# Patient Record
Sex: Female | Born: 1950 | Race: White | Hispanic: No | Smoking: Never smoker
Health system: Southern US, Community
[De-identification: ages and names within clinical notes are randomized; demographics above are authoritative.]

## PROBLEM LIST (undated history)

## (undated) DIAGNOSIS — K769 Liver disease, unspecified: Secondary | ICD-10-CM

## (undated) DIAGNOSIS — I4891 Unspecified atrial fibrillation: Secondary | ICD-10-CM

## (undated) DIAGNOSIS — T7840XA Allergy, unspecified, initial encounter: Secondary | ICD-10-CM

## (undated) DIAGNOSIS — Z803 Family history of malignant neoplasm of breast: Secondary | ICD-10-CM

## (undated) DIAGNOSIS — G4730 Sleep apnea, unspecified: Secondary | ICD-10-CM

## (undated) DIAGNOSIS — M1990 Unspecified osteoarthritis, unspecified site: Secondary | ICD-10-CM

## (undated) DIAGNOSIS — I10 Essential (primary) hypertension: Secondary | ICD-10-CM

## (undated) DIAGNOSIS — J45909 Unspecified asthma, uncomplicated: Secondary | ICD-10-CM

## (undated) DIAGNOSIS — K219 Gastro-esophageal reflux disease without esophagitis: Secondary | ICD-10-CM

## (undated) DIAGNOSIS — D735 Infarction of spleen: Secondary | ICD-10-CM

## (undated) DIAGNOSIS — R112 Nausea with vomiting, unspecified: Secondary | ICD-10-CM

## (undated) DIAGNOSIS — Z9889 Other specified postprocedural states: Secondary | ICD-10-CM

## (undated) DIAGNOSIS — K766 Portal hypertension: Secondary | ICD-10-CM

## (undated) DIAGNOSIS — Q8901 Asplenia (congenital): Secondary | ICD-10-CM

## (undated) DIAGNOSIS — J189 Pneumonia, unspecified organism: Secondary | ICD-10-CM

## (undated) HISTORY — PX: SPLENECTOMY, TOTAL: SHX788

## (undated) HISTORY — DX: Liver disease, unspecified: K76.9

## (undated) HISTORY — PX: HERNIA REPAIR: SHX51

## (undated) HISTORY — PX: ABDOMINAL HYSTERECTOMY: SHX81

## (undated) HISTORY — DX: Family history of malignant neoplasm of breast: Z80.3

## (undated) HISTORY — PX: TUBAL LIGATION: SHX77

## (undated) HISTORY — DX: Portal hypertension: K76.6

## (undated) HISTORY — DX: Asplenia (congenital): Q89.01

## (undated) HISTORY — DX: Gastro-esophageal reflux disease without esophagitis: K21.9

## (undated) HISTORY — PX: BREAST BIOPSY: SHX20

## (undated) HISTORY — DX: Allergy, unspecified, initial encounter: T78.40XA

## (undated) HISTORY — PX: FOOT SURGERY: SHX648

---

## 2015-04-09 DIAGNOSIS — R7989 Other specified abnormal findings of blood chemistry: Secondary | ICD-10-CM | POA: Insufficient documentation

## 2018-04-11 LAB — HM COLONOSCOPY

## 2018-09-16 DIAGNOSIS — Z23 Encounter for immunization: Secondary | ICD-10-CM | POA: Diagnosis not present

## 2018-11-03 DIAGNOSIS — D472 Monoclonal gammopathy: Secondary | ICD-10-CM | POA: Diagnosis not present

## 2018-11-06 DIAGNOSIS — R1011 Right upper quadrant pain: Secondary | ICD-10-CM | POA: Diagnosis not present

## 2018-11-06 DIAGNOSIS — K766 Portal hypertension: Secondary | ICD-10-CM | POA: Diagnosis not present

## 2018-11-06 DIAGNOSIS — D472 Monoclonal gammopathy: Secondary | ICD-10-CM | POA: Diagnosis not present

## 2018-11-06 DIAGNOSIS — Z9081 Acquired absence of spleen: Secondary | ICD-10-CM | POA: Diagnosis not present

## 2018-11-06 DIAGNOSIS — Z79899 Other long term (current) drug therapy: Secondary | ICD-10-CM | POA: Diagnosis not present

## 2018-11-06 DIAGNOSIS — Q8901 Asplenia (congenital): Secondary | ICD-10-CM | POA: Diagnosis not present

## 2018-11-06 DIAGNOSIS — D704 Cyclic neutropenia: Secondary | ICD-10-CM | POA: Diagnosis not present

## 2018-11-26 DIAGNOSIS — H9201 Otalgia, right ear: Secondary | ICD-10-CM | POA: Diagnosis not present

## 2018-11-26 DIAGNOSIS — J019 Acute sinusitis, unspecified: Secondary | ICD-10-CM | POA: Diagnosis not present

## 2018-11-26 DIAGNOSIS — B9689 Other specified bacterial agents as the cause of diseases classified elsewhere: Secondary | ICD-10-CM | POA: Diagnosis not present

## 2018-12-17 ENCOUNTER — Emergency Department (INDEPENDENT_AMBULATORY_CARE_PROVIDER_SITE_OTHER)
Admission: EM | Admit: 2018-12-17 | Discharge: 2018-12-17 | Disposition: A | Discharge status: Home / Self Care | Payer: Medicare Other | Source: Home / Self Care | Attending: Emergency Medicine | Admitting: Emergency Medicine

## 2018-12-17 ENCOUNTER — Encounter: Payer: Self-pay | Admitting: Emergency Medicine

## 2018-12-17 ENCOUNTER — Emergency Department (INDEPENDENT_AMBULATORY_CARE_PROVIDER_SITE_OTHER): Payer: Medicare Other

## 2018-12-17 DIAGNOSIS — S8265XA Nondisplaced fracture of lateral malleolus of left fibula, initial encounter for closed fracture: Secondary | ICD-10-CM

## 2018-12-17 DIAGNOSIS — W19XXXA Unspecified fall, initial encounter: Secondary | ICD-10-CM | POA: Diagnosis not present

## 2018-12-17 DIAGNOSIS — S8262XA Displaced fracture of lateral malleolus of left fibula, initial encounter for closed fracture: Secondary | ICD-10-CM | POA: Diagnosis not present

## 2018-12-17 DIAGNOSIS — S82832A Other fracture of upper and lower end of left fibula, initial encounter for closed fracture: Secondary | ICD-10-CM | POA: Diagnosis not present

## 2018-12-17 IMAGING — DX DG ANKLE COMPLETE 3+V*L*
3 series · 3 of 3 positions shown · non-contrast
Comparison: None.

CLINICAL DATA: 67-year-old who fell last night and injured the LEFT
ankle. Persistent pain and swelling. Initial encounter.

EXAM:
LEFT ANKLE COMPLETE - 3+ VIEW

[ankle ap]
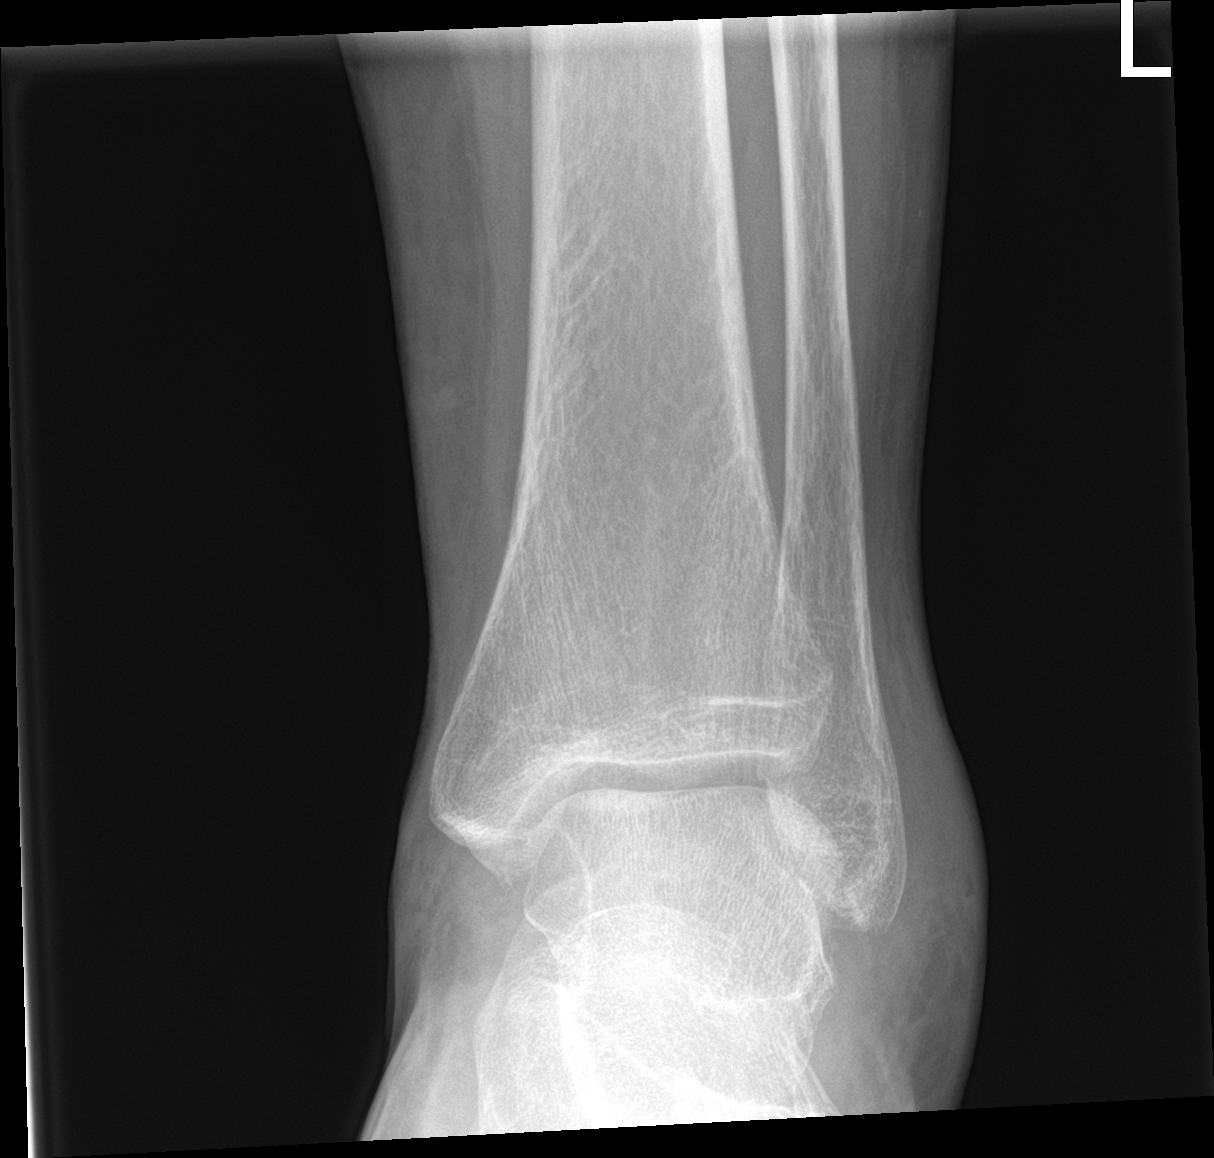

[ankle obl]
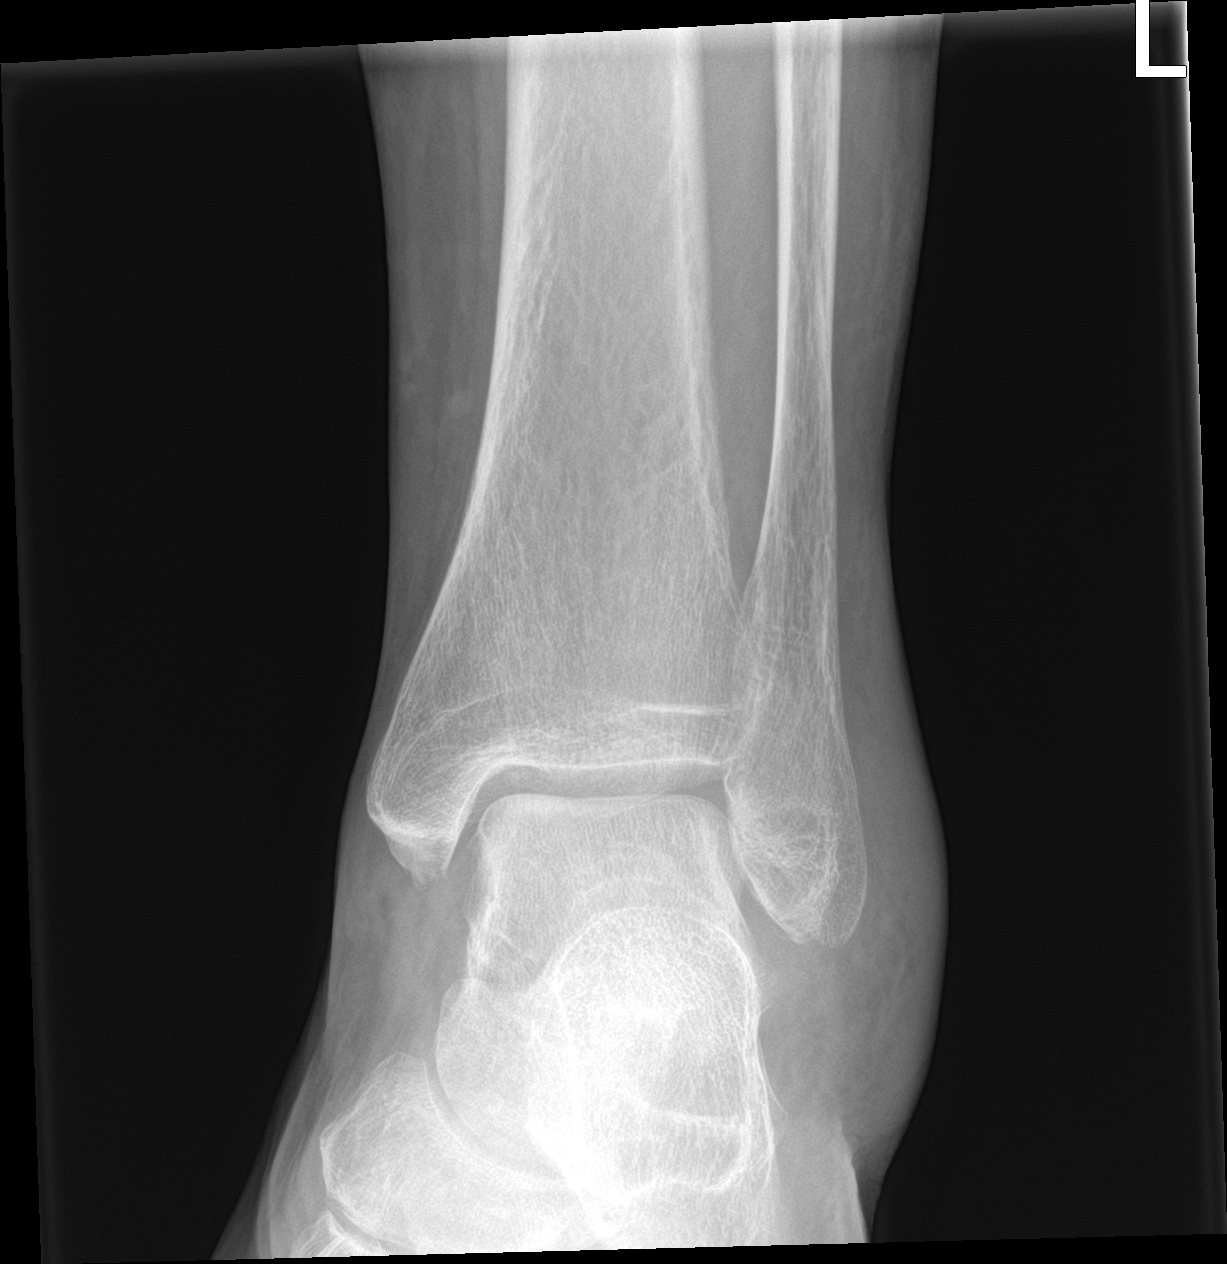

[ankle lat]
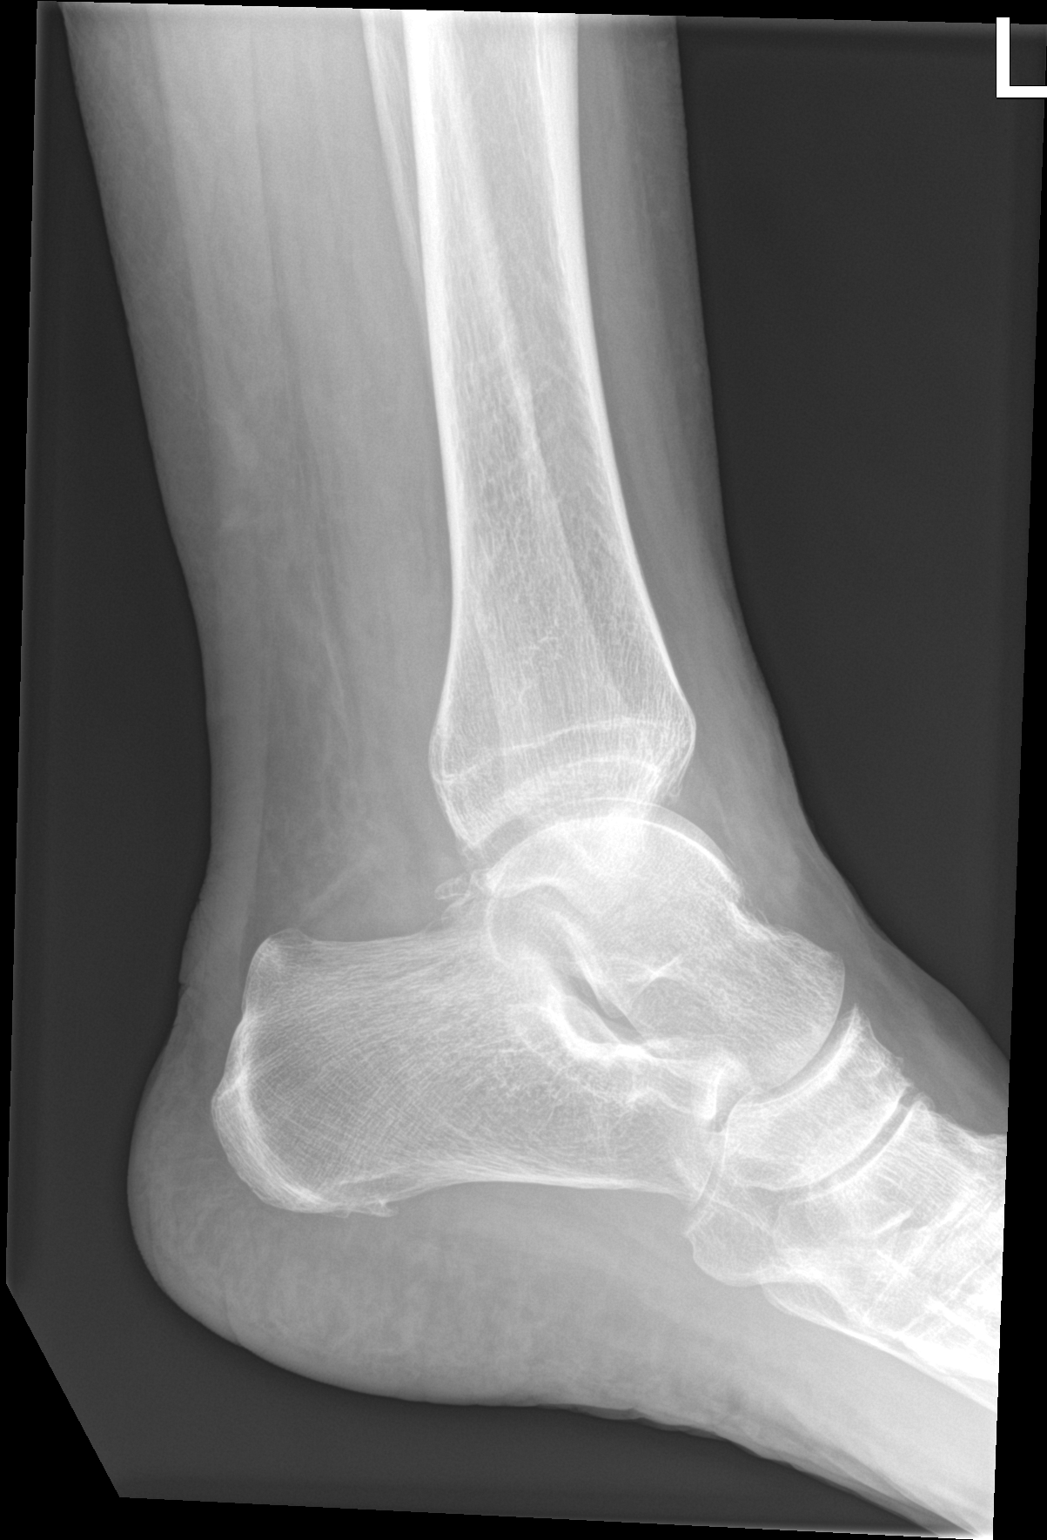

[3 of 3 positions shown; findings below may reference images not displayed]

FINDINGS: Diffuse soft tissue swelling, with marked LATERAL soft tissue
swelling. Very small avulsion fracture arising from the tip of the
LATERAL malleolus. No fractures elsewhere. Ankle mortise intact with
well-preserved joint space. Mild osseous demineralization. No
visible joint effusion.
IMPRESSION: Very small avulsion fracture arising from the tip of the LATERAL
malleolus.

## 2018-12-17 NOTE — ED Triage Notes (Signed)
Pt c/o left ankle pain after she fell last night. Swelling and states hx of previous fracture.

## 2018-12-17 NOTE — ED Provider Notes (Signed)
[name]    CSN: [id number] Arrival date & time: 12/17/18  1200     History   Chief Complaint Chief Complaint  Patient presents with  . Ankle Pain  Chief complaint: Acute left ankle injury  HPI Donna Ramsey is a 68 y.o. female.   HPI Yesterday, accidentally inverted/twisted left ankle and immediately had pain and swelling left lateral ankle.  Pain is moderate 5 out of 10, sharp and dull without radiation.  She is able to weight-bear minimally but painful to weight-bear or any movement of left ankle.  Denies left foot pain.  She took some ibuprofen OTC at 930 this morning which helped pain a little bit. Associated symptoms: Denies numbness or weakness.  No loss of consciousness or focal neurologic symptoms.  No chest pain shortness of breath nausea or vomiting.  She reports a history of left ankle fracture 10 years ago which healed without sequelae. History reviewed. No pertinent past medical history.  There are no active problems to display for this patient.   Past Surgical History:  Procedure Laterality Date  . ABDOMINAL HYSTERECTOMY    . FOOT SURGERY    . SPLENECTOMY, TOTAL      OB History   No obstetric history on file.      Home Medications    Prior to Admission medications   Not on File    Family History History reviewed. No pertinent family history. No family history noted by patient. Social History Social History   Tobacco Use  . Smoking status: Never Smoker  . Smokeless tobacco: Never Used  Substance Use Topics  . Alcohol use: Not on file  . Drug use: Not on file   Reviewed above.  Never smoked.  Allergies   Cephalosporins; Codeine; Penicillins; Levofloxacin; and Sulfa antibiotics   Review of Systems Review of Systems  All other systems reviewed and are negative.  Pertinent items noted in HPI and remainder of comprehensive ROS otherwise negative.   Physical Exam Triage Vital Signs ED Triage Vitals [12/17/18 1224]    Enc Vitals Group     BP (!) 145/81     Pulse Rate 64     Resp      Temp 98 F (36.7 C)     Temp Source Oral     SpO2 96 %     Weight 180 lb (81.6 kg)     Height      Head Circumference      Peak Flow      Pain Score 0     Pain Loc      Pain Edu?      Excl. in [place]?    No data found.  Updated Vital Signs BP (!) 145/81 (BP Location: Right Arm)   Pulse 64   Temp 98 F (36.7 C) (Oral)   Wt 81.6 kg   SpO2 96%    Physical Exam Vitals signs reviewed.  Constitutional:      General: She is not in acute distress.    Appearance: She is well-developed.  HENT:     Head: Normocephalic and atraumatic.  Eyes:     General: No scleral icterus.    Pupils: Pupils are equal, round, and reactive to light.  Neck:     Musculoskeletal: Normal range of motion and neck supple.  Cardiovascular:     Rate and Rhythm: Normal rate and regular rhythm.  Pulmonary:     Effort: Pulmonary effort is normal.  Abdominal:     General:  There is no distension.  Musculoskeletal:     Left knee: She exhibits normal range of motion and no bony tenderness.     Left ankle: She exhibits decreased range of motion, swelling and ecchymosis. She exhibits no laceration and normal pulse. Tenderness. Lateral malleolus, AITFL and proximal fibula tenderness found. No head of 5th metatarsal tenderness found. Achilles tendon normal.     Left foot: Normal capillary refill. No tenderness, bony tenderness, swelling or crepitus.     Comments: No calf swelling or tenderness or cords bilaterally.  Left knee: 1 x 1 cm area of tenderness with mild ecchymosis proximal lateral tibia.  No other tenderness or abnormality of left knee noted.  No instability.-Discussed with patient that it is extremely unlikely no evidence of fracture left knee based on physical exam, as she can ambulate and move left knee without any knee pain or problems.  Skin:    General: Skin is warm and dry.     Capillary Refill: Capillary refill takes less than  2 seconds.  Neurological:     General: No focal deficit present.     Mental Status: She is alert and oriented to person, place, and time.     Cranial Nerves: No cranial nerve deficit.  Psychiatric:        Behavior: Behavior normal.      UC Treatments / Results  Labs (all labs ordered are listed, but only abnormal results are displayed) Labs Reviewed - No data to display  EKG None  Radiology- both patient and I agree that imaging of left knee not indicated because physical exam does not suggest need for this.  X-RAY LEFT ANKLE:  LEFT ANKLE COMPLETE - 3+ VIEW  COMPARISON: None.  FINDINGS: Diffuse soft tissue swelling, with marked LATERAL soft tissue swelling. Very small avulsion fracture arising from the tip of the LATERAL malleolus. No fractures elsewhere. Ankle mortise intact with well-preserved joint space. Mild osseous demineralization. No visible joint effusion.  IMPRESSION: Very small avulsion fracture arising from the tip of the LATERAL malleolus.   Electronically Signed By: Evangeline Dakin M.D. On: 12/17/2018 13:06  Procedures Procedures (including critical care time)  Medications Ordered in UC Medications - No data to display  Initial Impression / Assessment and Plan / UC Course  I have reviewed the triage vital signs and the nursing notes.  Pertinent labs & imaging results that were available during my care of the patient were reviewed by me and considered in my medical decision making (see chart for details).     Reviewed with patient, x-ray shows very small avulsion fracture, tip of the lateral malleolus of left ankle.   Final Clinical Impressions(s) / UC Diagnoses   Final diagnoses:  Closed nondisplaced fracture of lateral malleolus of left fibula, initial encounter   Discussed with patient at length and reviewed x-ray showing very small avulsion fracture tip of lateral malleolus of left ankle. Treatment options discussed, as well as  risks, benefits, alternatives. Patient voiced understanding and agreement with the following plans:  Ace bandage applied left ankle. Ice, elevate.  Rest left ankle. Cam walker/boot left lower extremity applied, and that immobilized ankle well and decreased her pain. Crutches provided. She declined any prescription pain med, and prefers to use OTC ibuprofen up to 600 mg 3 times daily with food as needed for pain.  She has no history of kidney disease or intolerance to ibuprofen, but precautions discussed. Follow-up with sports medicine or orthopedist within 3-5 days, sooner if worse or new  symptoms. Red flags discussed and we discussed what to do if any red flags. She voiced understanding and agreement.   Controlled Substance Prescriptions [place] Controlled Substance Registry consulted? Not Applicable   Jacqulyn Cane, MD 12/19/18 1420

## 2018-12-24 ENCOUNTER — Ambulatory Visit (INDEPENDENT_AMBULATORY_CARE_PROVIDER_SITE_OTHER): Payer: Medicare Other

## 2018-12-24 ENCOUNTER — Encounter: Payer: Self-pay | Admitting: Sports Medicine

## 2018-12-24 ENCOUNTER — Ambulatory Visit (INDEPENDENT_AMBULATORY_CARE_PROVIDER_SITE_OTHER): Payer: Medicare Other | Admitting: Sports Medicine

## 2018-12-24 DIAGNOSIS — W19XXXD Unspecified fall, subsequent encounter: Secondary | ICD-10-CM

## 2018-12-24 DIAGNOSIS — S8265XA Nondisplaced fracture of lateral malleolus of left fibula, initial encounter for closed fracture: Secondary | ICD-10-CM | POA: Diagnosis not present

## 2018-12-24 DIAGNOSIS — S8262XD Displaced fracture of lateral malleolus of left fibula, subsequent encounter for closed fracture with routine healing: Secondary | ICD-10-CM

## 2018-12-24 DIAGNOSIS — S82402A Unspecified fracture of shaft of left fibula, initial encounter for closed fracture: Secondary | ICD-10-CM | POA: Insufficient documentation

## 2018-12-24 DIAGNOSIS — S82832A Other fracture of upper and lower end of left fibula, initial encounter for closed fracture: Secondary | ICD-10-CM | POA: Diagnosis not present

## 2018-12-24 IMAGING — DX DG ANKLE COMPLETE 3+V*L*
3 series · 3 of 3 positions shown · non-contrast
Comparison: Radiographs dated [DATE]

CLINICAL DATA: Follow-up of avulsion fracture of the distal fibula.

EXAM:
LEFT ANKLE COMPLETE - 3+ VIEW

[ankle ap]
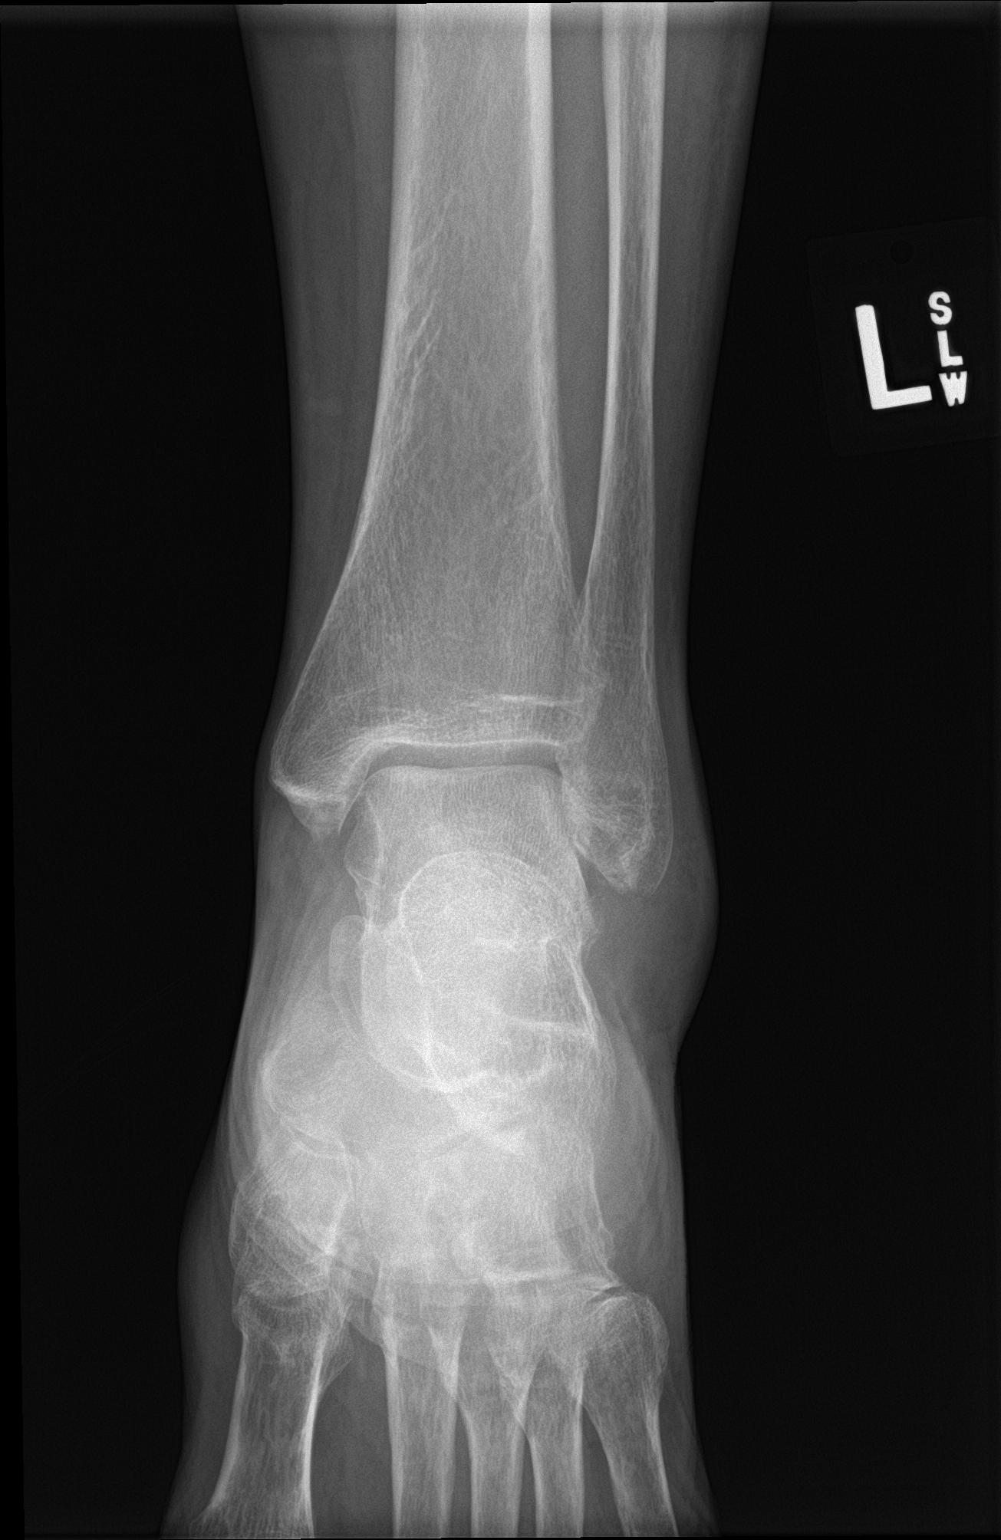

[ankle obl]
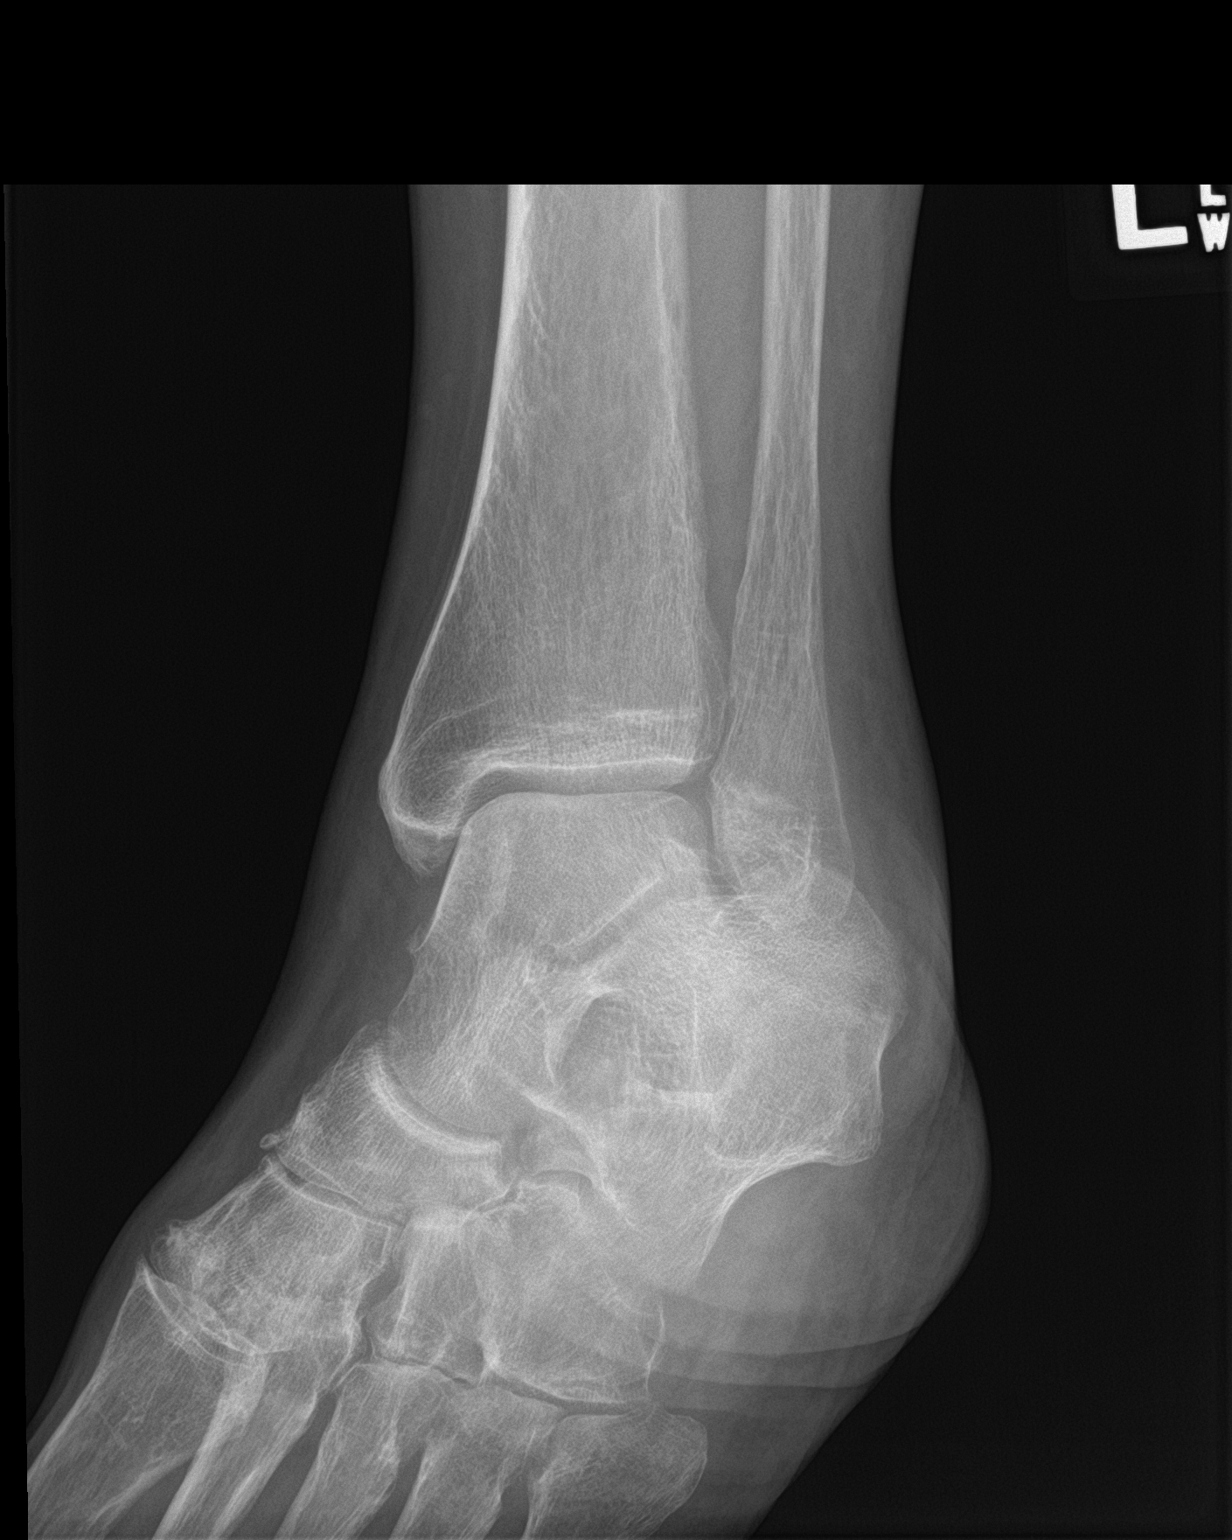

[ankle lat]
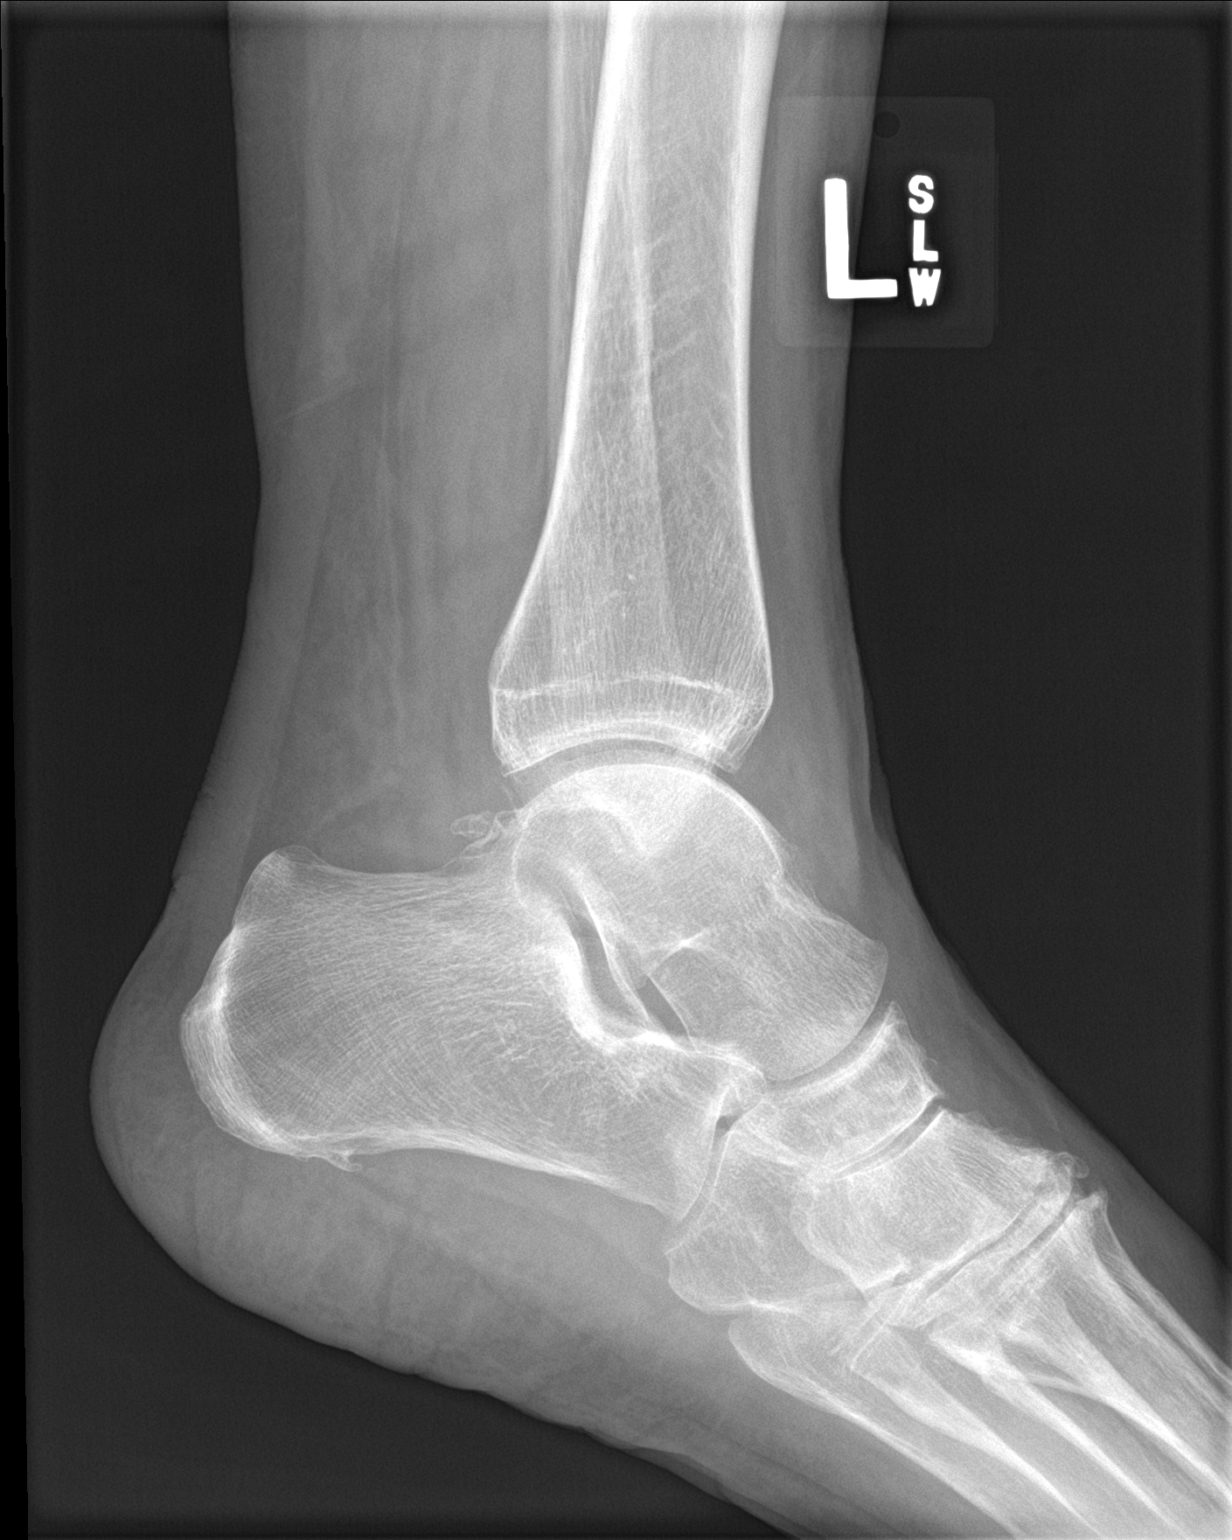

[3 of 3 positions shown; findings below may reference images not displayed]

FINDINGS: Again noted is a tiny avulsion from the tip of the lateral
malleolus. There is soft tissue swelling adjacent to the distal
fibula, but this has diminished.

Degenerative changes are present in the midfoot, stable.
IMPRESSION: Small avulsion from the tip of the lateral malleolus, unchanged.
Diminished soft tissue swelling.

## 2018-12-24 MED ORDER — MELOXICAM 15 MG PO TABS: ORAL_TABLET | ORAL | 3 refills | Status: DC

## 2018-12-24 NOTE — Progress Notes (Addendum)
Subjective:    CC: Left ankle injury  HPI:  Recently this pleasant 68 year old female took a misstep, inverted her left ankle and had immediate pain, swelling, bruising.  She was seen in urgent care where x-rays showed a fracture at the tip of the lateral malleolus.  Pain is mild, persistent.  Localized without radiation, has not been consistently wearing her cam boot.  She does have history of hepatic insufficiency, so avoiding acetaminophen.  I reviewed the past medical history, family history, social history, surgical history, and allergies today and no changes were needed.  Please see the problem list section below in epic for further details.  Past Medical History: No past medical history on file. Past Surgical History: Past Surgical History:  Procedure Laterality Date  . ABDOMINAL HYSTERECTOMY    . FOOT SURGERY    . SPLENECTOMY, TOTAL     Social History: Social History   Socioeconomic History  . Marital status: Married    Spouse name: Not on file  . Number of children: Not on file  . Years of education: Not on file  . Highest education level: Not on file  Occupational History  . Not on file  Social Needs  . Financial resource strain: Not on file  . Food insecurity:    Worry: Not on file    Inability: Not on file  . Transportation needs:    Medical: Not on file    Non-medical: Not on file  Tobacco Use  . Smoking status: Never Smoker  . Smokeless tobacco: Never Used  Substance and Sexual Activity  . Alcohol use: Not on file  . Drug use: Not on file  . Sexual activity: Not on file  Lifestyle  . Physical activity:    Days per week: Not on file    Minutes per session: Not on file  . Stress: Not on file  Relationships  . Social connections:    Talks on phone: Not on file    Gets together: Not on file    Attends religious service: Not on file    Active member of club or organization: Not on file    Attends meetings of clubs or organizations: Not on file   Relationship status: Not on file  Other Topics Concern  . Not on file  Social History Narrative  . Not on file   Family History: No family history on file. Allergies: Allergies  Allergen Reactions  . Cephalosporins Hives  . Codeine Hives and Rash  . Penicillins Hives, Other (See Comments) and Rash    Hives   . Levofloxacin Rash and Other (See Comments)    tendonitis tendonitis unknown unknown   . Sulfa Antibiotics Rash   Medications: See med rec.  Review of Systems: No headache, visual changes, nausea, vomiting, diarrhea, constipation, dizziness, abdominal pain, skin rash, fevers, chills, night sweats, swollen lymph nodes, weight loss, chest pain, body aches, joint swelling, muscle aches, shortness of breath, mood changes, visual or auditory hallucinations.  Objective:    General: Well Developed, well nourished, and in no acute distress.  Neuro: Alert and oriented x3, extra-ocular muscles intact, sensation grossly intact.  HEENT: Normocephalic, atraumatic, pupils equal round reactive to light, neck supple, no masses, no lymphadenopathy, thyroid nonpalpable.  Skin: Warm and dry, no rashes noted.  Cardiac: Regular rate and rhythm, no murmurs rubs or gallops.  Respiratory: Clear to auscultation bilaterally. Not using accessory muscles, speaking in full sentences.  Abdominal: Soft, nontender, nondistended, positive bowel sounds, no masses, no organomegaly.  Left ankle: Moderate bruising, mild swelling. Range of motion is full in all directions. Strength is 5/5 in all directions. Stable lateral and medial ligaments; squeeze test and kleiger test unremarkable; Talar dome nontender; No pain at base of 5th MT; No tenderness over cuboid; No tenderness over N spot or navicular prominence Tender to palpation at the tip of the lateral malleolus No sign of peroneal tendon subluxations; Negative tarsal tunnel tinel's Able to walk 4 steps.  X-ray shows a tiny avulsion from the tip  of the lateral malleolus.  Also widespread degenerative changes.  Impression and Recommendations:    The patient was counselled, risk factors were discussed, anticipatory guidance given.  Fracture of fibula, left, closed Continue cam boot. Repeat x-rays today. Meloxicam for pain, patient understands the risk of delayed fracture healing but she does have history of hepatic insufficiency. Return to see me in a month.  I billed a fracture code for this encounter, all subsequent visits will be post-op checks in the global period.  ___________________________________________ Gwen Her. Dianah Field, M.D., ABFM., CAQSM. Primary Care and Sports Medicine [facility]  Adjunct Professor of [place] [employer] [facility] [employer]

## 2018-12-24 NOTE — Assessment & Plan Note (Signed)
Continue cam boot. Repeat x-rays today. Meloxicam for pain, patient understands the risk of delayed fracture healing but she does have history of hepatic insufficiency. Return to see me in a month.  I billed a fracture code for this encounter, all subsequent visits will be post-op checks in the global period.

## 2019-02-02 ENCOUNTER — Encounter: Payer: Self-pay | Admitting: Sports Medicine

## 2019-02-02 ENCOUNTER — Ambulatory Visit (INDEPENDENT_AMBULATORY_CARE_PROVIDER_SITE_OTHER): Payer: Medicare Other | Admitting: Sports Medicine

## 2019-02-02 DIAGNOSIS — S63092A Other subluxation of left wrist and hand, initial encounter: Secondary | ICD-10-CM

## 2019-02-02 DIAGNOSIS — S8265XD Nondisplaced fracture of lateral malleolus of left fibula, subsequent encounter for closed fracture with routine healing: Secondary | ICD-10-CM | POA: Diagnosis not present

## 2019-02-02 NOTE — Patient Instructions (Signed)
Extensor Carpi Ulnaris Instability Tendons attach muscles to bones. They also help with joint movements. When a tendon is injured or it moves out of its normal position, the joint movements can be affected. The extensor carpi ulnaris (ECU) tendon is located on the back of the wrist, on the side that is closest to the little finger. Itruns under a band of fibrous tissue (extensor retinaculum) to stabilize its position in the wrist. This tendonhelps with extending or bending back your wrist and angling your wrist toward your body. It also helps with gripping and pulling items close to your body. Injury to the ECU may result in ECU instability and hand and wrist weakness. Damage to the retinaculum causes the ECU tendon to slip in and out of the bony groove (subluxation) that it normally lies in or to come out of the groove completely (dislocation). What are the causes? This condition often happens with repeated activities that require forceful rotation or extension of the wrist. Other causes of ECU instability include:  Forced (traumatic) rotation of the wrist to a palm-up position.  A shallow or malformed groove in the bone where the ECU normally lies. What increases the risk? This condition is more likely to develop in:  People who play sports that include repeated, forceful turning up of the wrist into a palm-up position, such as tennis, golf, and weight lifting.  People who have had a previous wrist injury.  People who have poor wrist strength and flexibility.  People who do not warm up properly before activities. What are the signs or symptoms? Symptoms of this condition include:  Painful or painless "snapping" feeling over the back of the wrist-on the little finger side-when you rotate your wrist into the palm-up position.  Swelling of your injured wrist.  Pain when the injured area is touched.  Weakness in your wrist when you turn it to the palm-up position. How is this  diagnosed? This condition is diagnosed with a medical history and physical exam. You may also have imaging studies to confirm the diagnosis. These can include:  Ultrasound.  MRI. How is this treated? Treatment may include the use of icing and medicines to reduce pain and swelling.You may also be advised to wear a splint or brace to limit your wrist motion. In less severe cases, treatment may also include working with a physical therapist to strengthen your wrist and stabilize your ECU. In severe cases, surgery may be needed. Follow these instructions at home: If you have a splint or brace:  Wear it as told by your health care provider. Remove it only as told by your health care provider.  Loosen the splint or brace if your fingers become numb and tingle, or if they turn cold and blue.  Keep the splint or brace clean and dry. Managing pain, stiffness, and swelling  If directed, apply ice to the injured area. ? Put ice in a plastic bag. ? Place a towel between your skin and the bag. ? Leave the ice on for 20 minutes, 2-3 times per day.  Move your fingers often to avoid stiffness and to lessen swelling.  Raise (elevate) the injured area above the level of your heart while you are sitting or lying down. General instructions  Return to your normal activities as told by your health care provider. Ask your health care provider what activities are safe for you.  Take over-the-counter and prescription medicines only as told by your health care provider.  Keep all follow-up visits  as told by your health care provider. This is important.  Do not drive or operate heavy machinery while taking prescription pain medicine. Contact a health care provider if:  Your pain, tenderness, or swelling gets worse, even if you have had treatment.  You have numbness or tingling in your wrist, hand, or fingers on the injured side. This information is not intended to replace advice given to you by your  health care provider. Make sure you discuss any questions you have with your health care provider. Document Released: 12/03/2005 Document Revised: 05/10/2016 Document Reviewed: 02/04/2015 Elsevier Interactive Patient Education  2019 [employer].

## 2019-02-02 NOTE — Assessment & Plan Note (Signed)
Velcro wrist brace. Continue meloxicam. Exercises given, return to see me in 2 weeks for this.

## 2019-02-02 NOTE — Progress Notes (Signed)
Subjective:    CC: Follow-up  HPI: Donna Ramsey returns, she is a pleasant 68 year old female, we have been treating her for a lateral malleolar avulsion fracture, this is for the most part pain-free after about 6 weeks in a boot.  Unfortunately she went to visit some family, 1 of her dogs jumped on her, she pushed the dog back with her left hand but then immediately felt pain over the ulnar aspect of her left wrist.  No mechanical symptoms, but pain is worse with twisting.  Severe, persistent, localized over the ulnar styloid without radiation.  No swelling, no bruising.  I reviewed the past medical history, family history, social history, surgical history, and allergies today and no changes were needed.  Please see the problem list section below in epic for further details.  Past Medical History: No past medical history on file. Past Surgical History: Past Surgical History:  Procedure Laterality Date  . ABDOMINAL HYSTERECTOMY    . FOOT SURGERY    . SPLENECTOMY, TOTAL     Social History: Social History   Socioeconomic History  . Marital status: Married    Spouse name: Not on file  . Number of children: Not on file  . Years of education: Not on file  . Highest education level: Not on file  Occupational History  . Not on file  Social Needs  . Financial resource strain: Not on file  . Food insecurity:    Worry: Not on file    Inability: Not on file  . Transportation needs:    Medical: Not on file    Non-medical: Not on file  Tobacco Use  . Smoking status: Never Smoker  . Smokeless tobacco: Never Used  Substance and Sexual Activity  . Alcohol use: Not on file  . Drug use: Not on file  . Sexual activity: Not on file  Lifestyle  . Physical activity:    Days per week: Not on file    Minutes per session: Not on file  . Stress: Not on file  Relationships  . Social connections:    Talks on phone: Not on file    Gets together: Not on file    Attends religious service: Not on file     Active member of club or organization: Not on file    Attends meetings of clubs or organizations: Not on file    Relationship status: Not on file  Other Topics Concern  . Not on file  Social History Narrative  . Not on file   Family History: No family history on file. Allergies: Allergies  Allergen Reactions  . Cephalosporins Hives  . Codeine Hives and Rash  . Penicillins Hives, Other (See Comments) and Rash    Hives   . Levofloxacin Rash and Other (See Comments)    tendonitis tendonitis unknown unknown   . Sulfa Antibiotics Rash   Medications: See med rec.  Review of Systems: No fevers, chills, night sweats, weight loss, chest pain, or shortness of breath.   Objective:    General: Well Developed, well nourished, and in no acute distress.  Neuro: Alert and oriented x3, extra-ocular muscles intact, sensation grossly intact.  HEENT: Normocephalic, atraumatic, pupils equal round reactive to light, neck supple, no masses, no lymphadenopathy, thyroid nonpalpable.  Skin: Warm and dry, no rashes. Cardiac: Regular rate and rhythm, no murmurs rubs or gallops, no lower extremity edema.  Respiratory: Clear to auscultation bilaterally. Not using accessory muscles, speaking in full sentences. Left ankle: No visible erythema or  swelling. Range of motion is full in all directions. Strength is 5/5 in all directions. Stable lateral and medial ligaments; squeeze test and kleiger test unremarkable; Talar dome nontender; No pain at base of 5th MT; No tenderness over cuboid; No tenderness over N spot or navicular prominence No tenderness on posterior aspects of lateral and medial malleolus No sign of peroneal tendon subluxations; Negative tarsal tunnel tinel's Able to walk 4 steps. Left wrist: Inspection normal with no visible erythema or swelling. ROM smooth and normal with good flexion and extension and ulnar/radial deviation that is symmetrical with opposite wrist. Palpation is  normal over metacarpals, navicular, lunate, and TFCC. Tender to palpation over the sixth extensor compartment and the extensor carpi ulnaris specifically, reproduction of pain with resisted ulnar deviation of the wrist, no tenderness over the ulna or the ulnar styloid No snuffbox tenderness. No tenderness over [place] of [place]. Strength 5/5 in all directions without pain. Negative tinel's and phalens signs. Negative Finkelstein sign. Negative Watson's test.  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 Continue meloxicam. Exercises given, return to see me in 2 weeks for this.  Fracture of fibula, left, closed Pretty much healed, transition into a regular shoe.  ___________________________________________ Gwen Her. Dianah Field, M.D., ABFM., CAQSM. Primary Care and Sports Medicine [facility]  Adjunct Professor of [place] [employer]

## 2019-02-02 NOTE — Assessment & Plan Note (Signed)
Pretty much healed, transition into a regular shoe.

## 2019-02-03 ENCOUNTER — Ambulatory Visit: Payer: Medicare Other | Admitting: Sports Medicine

## 2019-02-16 ENCOUNTER — Ambulatory Visit (INDEPENDENT_AMBULATORY_CARE_PROVIDER_SITE_OTHER): Payer: Medicare Other | Admitting: Sports Medicine

## 2019-02-16 ENCOUNTER — Encounter: Payer: Self-pay | Admitting: Sports Medicine

## 2019-02-16 DIAGNOSIS — M7552 Bursitis of left shoulder: Secondary | ICD-10-CM | POA: Diagnosis not present

## 2019-02-16 DIAGNOSIS — M7672 Peroneal tendinitis, left leg: Secondary | ICD-10-CM | POA: Diagnosis not present

## 2019-02-16 DIAGNOSIS — S63092D Other subluxation of left wrist and hand, subsequent encounter: Secondary | ICD-10-CM

## 2019-02-16 NOTE — Assessment & Plan Note (Signed)
Pain resolved with Velcro brace.

## 2019-02-16 NOTE — Progress Notes (Signed)
Subjective:    CC: Shoulder pain  HPI: Donna Ramsey returns, she is a pleasant 68 year old female, we treated her for a left fibular fracture, this resolved, also for a ECU subluxation, this resolved.  She is now complaining of pain in her left shoulder, localized over the deltoid and worse with overhead activities in the doctor.  It is waking her from sleep.  Moderate, persistent, no paresthesias, no trauma.  In addition she has had different pain in her left ankle, localized behind the lateral malleolus.  I reviewed the past medical history, family history, social history, surgical history, and allergies today and no changes were needed.  Please see the problem list section below in epic for further details.  Past Medical History: No past medical history on file. Past Surgical History: Past Surgical History:  Procedure Laterality Date  . ABDOMINAL HYSTERECTOMY    . FOOT SURGERY    . SPLENECTOMY, TOTAL     Social History: Social History   Socioeconomic History  . Marital status: Married    Spouse name: Not on file  . Number of children: Not on file  . Years of education: Not on file  . Highest education level: Not on file  Occupational History  . Not on file  Social Needs  . Financial resource strain: Not on file  . Food insecurity:    Worry: Not on file    Inability: Not on file  . Transportation needs:    Medical: Not on file    Non-medical: Not on file  Tobacco Use  . Smoking status: Never Smoker  . Smokeless tobacco: Never Used  Substance and Sexual Activity  . Alcohol use: Not on file  . Drug use: Not on file  . Sexual activity: Not on file  Lifestyle  . Physical activity:    Days per week: Not on file    Minutes per session: Not on file  . Stress: Not on file  Relationships  . Social connections:    Talks on phone: Not on file    Gets together: Not on file    Attends religious service: Not on file    Active member of club or organization: Not on file   Attends meetings of clubs or organizations: Not on file    Relationship status: Not on file  Other Topics Concern  . Not on file  Social History Narrative  . Not on file   Family History: No family history on file. Allergies: Allergies  Allergen Reactions  . Cephalosporins Hives  . Codeine Hives and Rash  . Penicillins Hives, Other (See Comments) and Rash    Hives   . Levofloxacin Rash and Other (See Comments)    tendonitis tendonitis unknown unknown   . Sulfa Antibiotics Rash   Medications: See med rec.  Review of Systems: No fevers, chills, night sweats, weight loss, chest pain, or shortness of breath.   Objective:    General: Well Developed, well nourished, and in no acute distress.  Neuro: Alert and oriented x3, extra-ocular muscles intact, sensation grossly intact.  HEENT: Normocephalic, atraumatic, pupils equal round reactive to light, neck supple, no masses, no lymphadenopathy, thyroid nonpalpable.  Skin: Warm and dry, no rashes. Cardiac: Regular rate and rhythm, no murmurs rubs or gallops, no lower extremity edema.  Respiratory: Clear to auscultation bilaterally. Not using accessory muscles, speaking in full sentences. Left shoulder: Inspection reveals no abnormalities, atrophy or asymmetry. Palpation is normal with no tenderness over AC joint or bicipital groove. ROM is  full in all planes. Rotator cuff strength normal throughout. Positive Neer and Hawkin's tests, empty can. Speeds and Yergason's tests normal. No labral pathology noted with negative Obrien's, negative crank, negative clunk, and good stability. Normal scapular function observed. No painful arc and no drop arm sign. No apprehension sign Left ankle: No visible erythema or swelling. Range of motion is full in all directions. Strength is 5/5 in all directions. Stable lateral and medial ligaments; squeeze test and kleiger test unremarkable; Talar dome nontender; No pain at base of 5th MT; No  tenderness over cuboid; No tenderness over N spot or navicular prominence Tenderness behind the lateral malleolus with reproduction of pain with resisted eversion of the ankle No sign of peroneal tendon subluxations; Negative tarsal tunnel tinel's Able to walk 4 steps.  Impression and Recommendations:    Subluxation of left extensor carpi ulnaris tendon Pain resolved with Velcro brace.  Peroneal tendinitis, left ASO, rehab exercises given. Return in 6 weeks, injection if no better. No tenderness over the bone.  Acute shoulder bursitis, left May use over-the-counter NSAIDs, rehab exercises given. Return to see me in 6 weeks, formal PT versus injection if no better. ___________________________________________ Gwen Her. Dianah Field, M.D., ABFM., CAQSM. Primary Care and Sports Medicine [facility]  Adjunct Professor of [place] [employer] [facility] [employer]

## 2019-02-16 NOTE — Assessment & Plan Note (Signed)
ASO, rehab exercises given. Return in 6 weeks, injection if no better. No tenderness over the bone.

## 2019-02-16 NOTE — Assessment & Plan Note (Signed)
May use over-the-counter NSAIDs, rehab exercises given. Return to see me in 6 weeks, formal PT versus injection if no better.

## 2019-04-01 ENCOUNTER — Ambulatory Visit: Payer: Medicare Other | Admitting: Sports Medicine

## 2019-04-14 IMAGING — US ULTRASOUND ABDOMEN LIMITED
1 series · 14 of 25 positions shown · non-contrast
Comparison: None.

CLINICAL DATA: 67-year-old female with a history of right upper
quadrant pain and epigastric pain

EXAM:
ULTRASOUND ABDOMEN LIMITED RIGHT UPPER QUADRANT

[Series 1: ultrasound abdomen limited · 0.20mm/px · 14 of 51 slices shown]
[im 1/51]
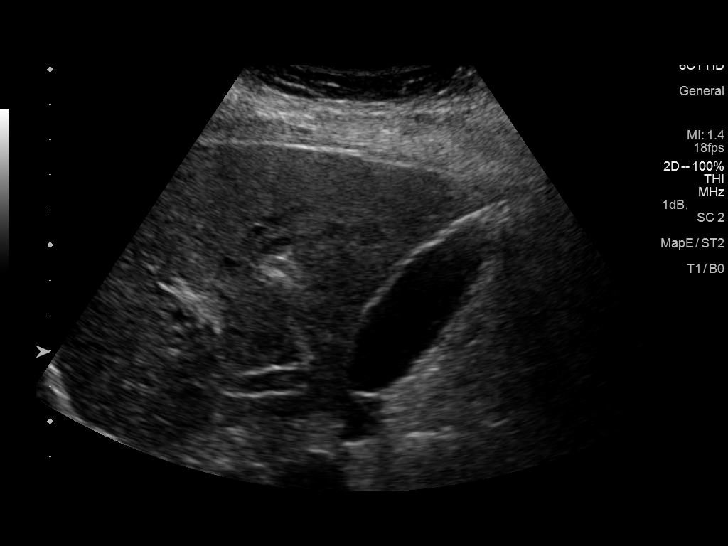
[im 5/51]
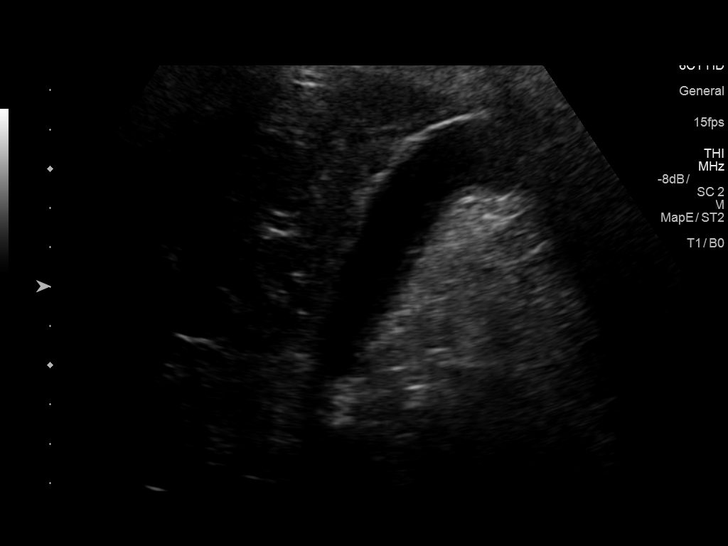
[im 9/51]
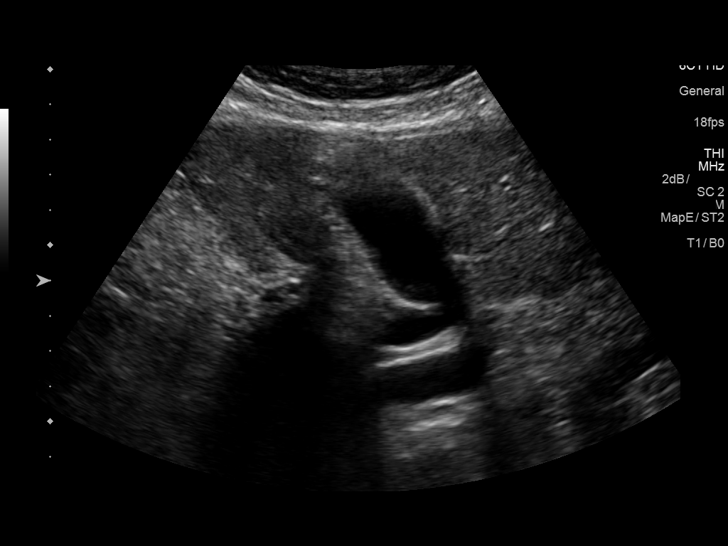
[im 13/51]
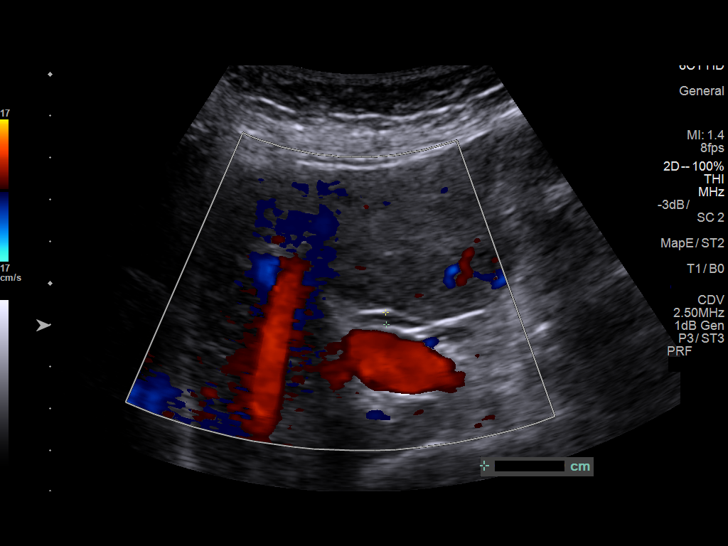
[im 17/51]
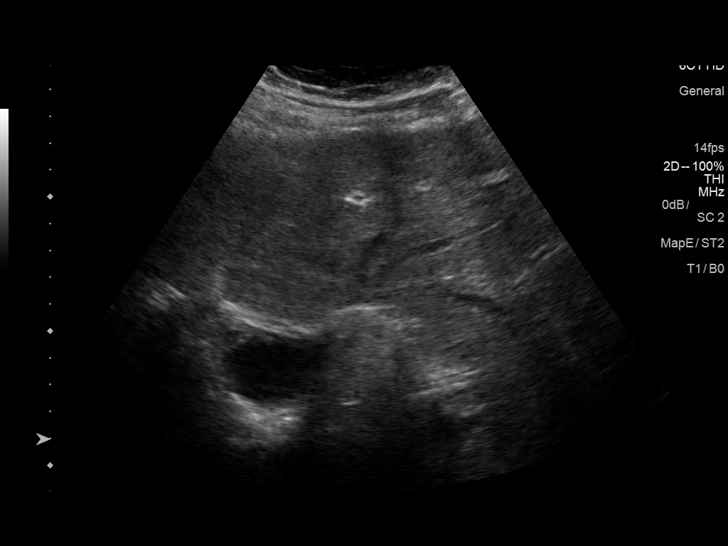
[im 19/51]
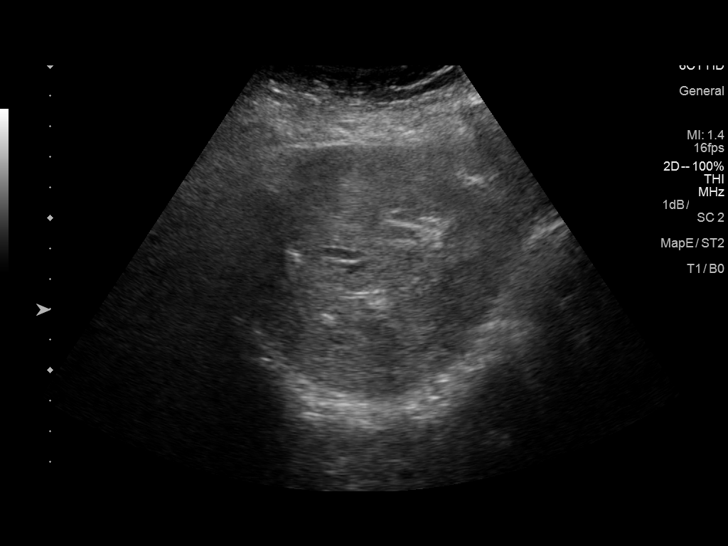
[im 23/51]
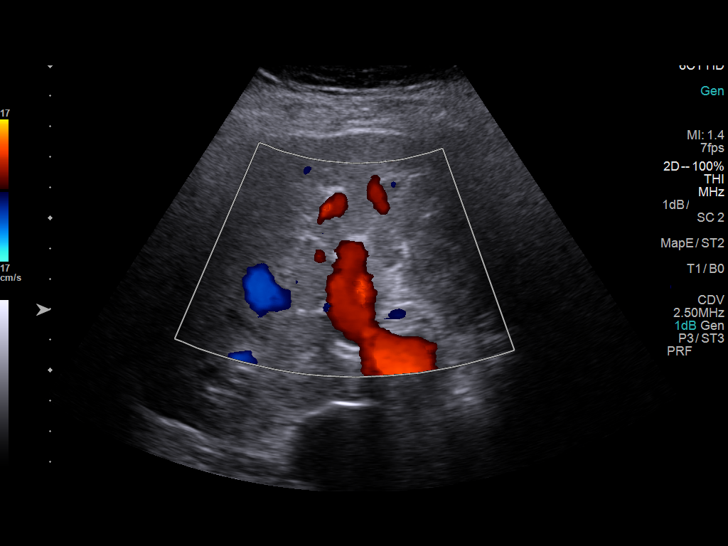
[im 28/51]
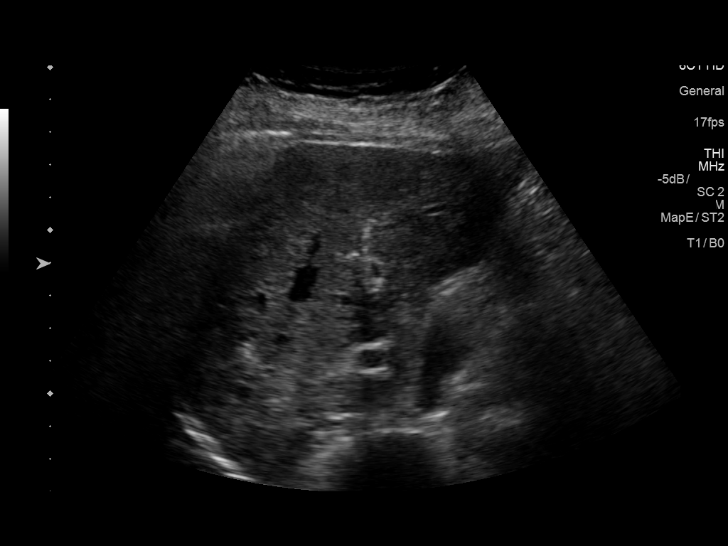
[im 32/51]
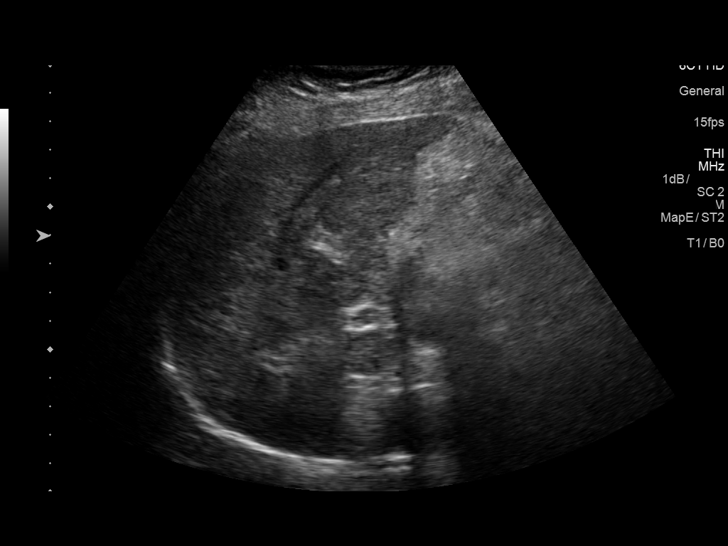
[im 34/51]
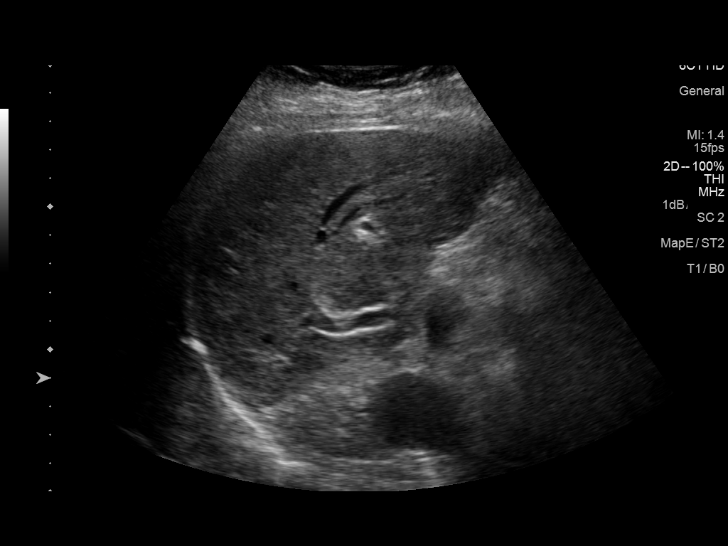
[im 38/51]
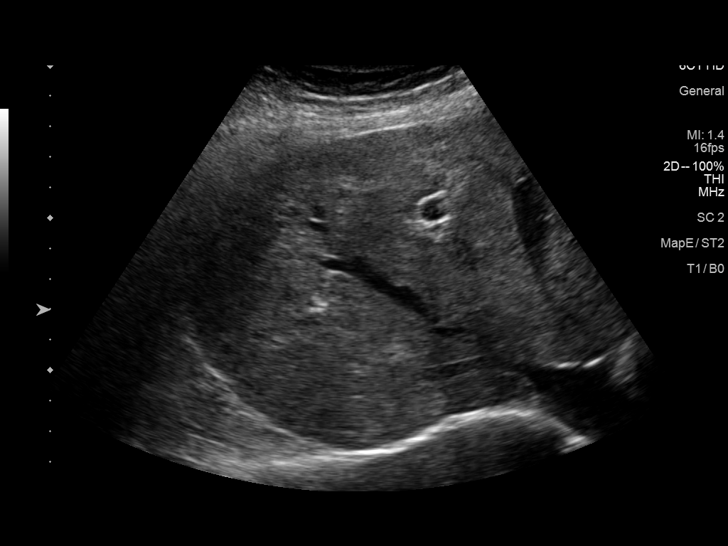
[im 42/51]
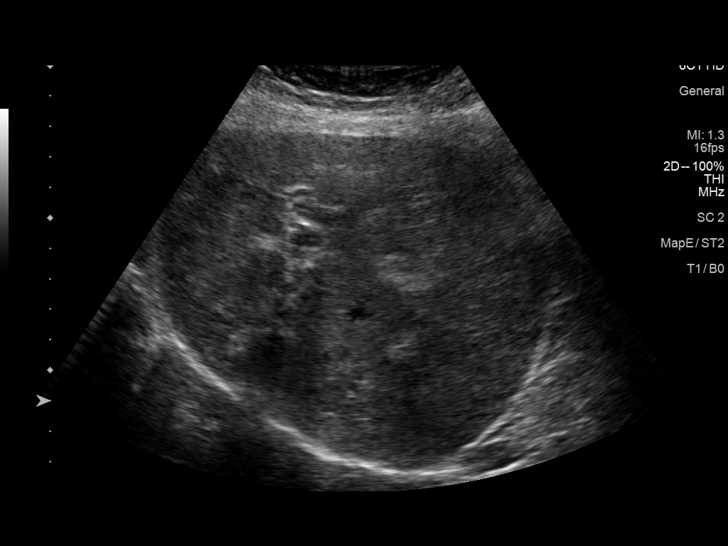
[im 46/51]
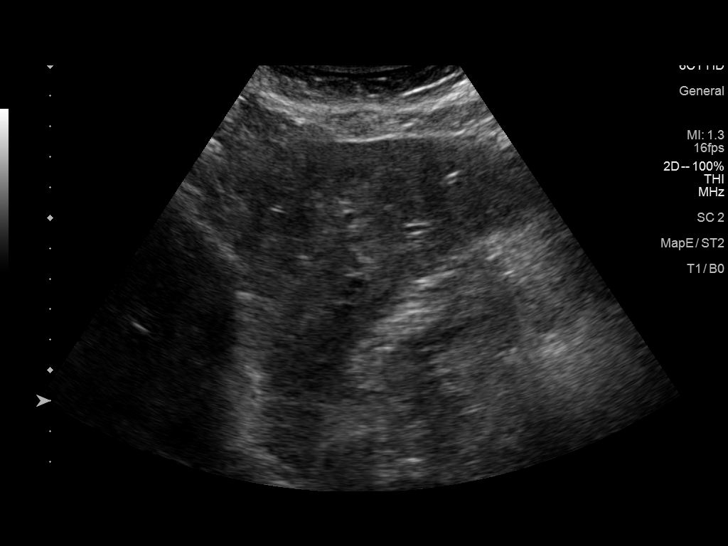
[im 51/51]
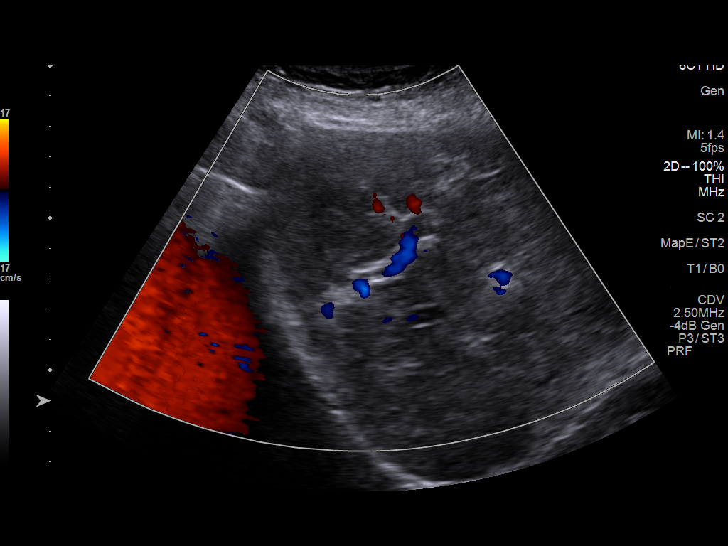

[14 of 25 positions shown; findings below may reference images not displayed]

FINDINGS: Gallbladder:

No gallstones or wall thickening visualized. No sonographic Murphy
sign noted by sonographer.

Common bile duct:

Diameter: 2.9 mm

Liver:

Heterogeneous appearance of the liver parenchyma. No focal lesion.
Portal vein is patent on color Doppler imaging with normal direction
of blood flow towards the liver.
IMPRESSION: Sonographic survey demonstrates no evidence of cholelithiasis, with
no evidence of acute cholecystitis.

Heterogeneous appearance of the liver, nonspecific though could
indicate steatosis or other medical liver disease.

## 2019-04-23 DIAGNOSIS — R300 Dysuria: Secondary | ICD-10-CM | POA: Diagnosis not present

## 2019-04-27 DIAGNOSIS — Z79899 Other long term (current) drug therapy: Secondary | ICD-10-CM | POA: Diagnosis not present

## 2019-04-27 DIAGNOSIS — D472 Monoclonal gammopathy: Secondary | ICD-10-CM | POA: Diagnosis not present

## 2019-04-27 DIAGNOSIS — Z13220 Encounter for screening for lipoid disorders: Secondary | ICD-10-CM | POA: Diagnosis not present

## 2019-04-27 DIAGNOSIS — R300 Dysuria: Secondary | ICD-10-CM | POA: Diagnosis not present

## 2019-04-27 DIAGNOSIS — K766 Portal hypertension: Secondary | ICD-10-CM | POA: Diagnosis not present

## 2019-04-28 DIAGNOSIS — R8281 Pyuria: Secondary | ICD-10-CM | POA: Diagnosis not present

## 2019-05-01 DIAGNOSIS — J4520 Mild intermittent asthma, uncomplicated: Secondary | ICD-10-CM | POA: Diagnosis not present

## 2019-05-01 DIAGNOSIS — K219 Gastro-esophageal reflux disease without esophagitis: Secondary | ICD-10-CM | POA: Diagnosis not present

## 2019-05-01 DIAGNOSIS — K766 Portal hypertension: Secondary | ICD-10-CM | POA: Diagnosis not present

## 2019-05-01 DIAGNOSIS — D472 Monoclonal gammopathy: Secondary | ICD-10-CM | POA: Diagnosis not present

## 2019-05-01 DIAGNOSIS — M85859 Other specified disorders of bone density and structure, unspecified thigh: Secondary | ICD-10-CM | POA: Diagnosis not present

## 2019-05-01 DIAGNOSIS — M810 Age-related osteoporosis without current pathological fracture: Secondary | ICD-10-CM | POA: Diagnosis not present

## 2019-05-01 DIAGNOSIS — N6320 Unspecified lump in the left breast, unspecified quadrant: Secondary | ICD-10-CM | POA: Diagnosis not present

## 2019-05-01 DIAGNOSIS — Q8901 Asplenia (congenital): Secondary | ICD-10-CM | POA: Diagnosis not present

## 2019-05-01 DIAGNOSIS — D704 Cyclic neutropenia: Secondary | ICD-10-CM | POA: Diagnosis not present

## 2019-05-01 DIAGNOSIS — Z0000 Encounter for general adult medical examination without abnormal findings: Secondary | ICD-10-CM | POA: Diagnosis not present

## 2019-05-04 DIAGNOSIS — H11123 Conjunctival concretions, bilateral: Secondary | ICD-10-CM | POA: Diagnosis not present

## 2019-05-04 DIAGNOSIS — H2513 Age-related nuclear cataract, bilateral: Secondary | ICD-10-CM | POA: Diagnosis not present

## 2019-05-04 DIAGNOSIS — H35373 Puckering of macula, bilateral: Secondary | ICD-10-CM | POA: Diagnosis not present

## 2019-05-04 DIAGNOSIS — H5203 Hypermetropia, bilateral: Secondary | ICD-10-CM | POA: Diagnosis not present

## 2019-05-04 DIAGNOSIS — H524 Presbyopia: Secondary | ICD-10-CM | POA: Diagnosis not present

## 2019-05-04 DIAGNOSIS — H0288B Meibomian gland dysfunction left eye, upper and lower eyelids: Secondary | ICD-10-CM | POA: Diagnosis not present

## 2019-05-04 DIAGNOSIS — Z83511 Family history of glaucoma: Secondary | ICD-10-CM | POA: Diagnosis not present

## 2019-05-04 DIAGNOSIS — H0288A Meibomian gland dysfunction right eye, upper and lower eyelids: Secondary | ICD-10-CM | POA: Diagnosis not present

## 2019-05-04 DIAGNOSIS — H43813 Vitreous degeneration, bilateral: Secondary | ICD-10-CM | POA: Diagnosis not present

## 2019-05-13 ENCOUNTER — Encounter: Payer: Self-pay | Admitting: Sports Medicine

## 2019-05-13 ENCOUNTER — Ambulatory Visit (INDEPENDENT_AMBULATORY_CARE_PROVIDER_SITE_OTHER): Payer: Medicare Other

## 2019-05-13 ENCOUNTER — Ambulatory Visit (INDEPENDENT_AMBULATORY_CARE_PROVIDER_SITE_OTHER): Payer: Medicare Other | Admitting: Sports Medicine

## 2019-05-13 ENCOUNTER — Other Ambulatory Visit: Payer: Self-pay

## 2019-05-13 DIAGNOSIS — M7672 Peroneal tendinitis, left leg: Secondary | ICD-10-CM | POA: Diagnosis not present

## 2019-05-13 DIAGNOSIS — M1612 Unilateral primary osteoarthritis, left hip: Secondary | ICD-10-CM

## 2019-05-13 DIAGNOSIS — S79912A Unspecified injury of left hip, initial encounter: Secondary | ICD-10-CM | POA: Diagnosis not present

## 2019-05-13 DIAGNOSIS — D472 Monoclonal gammopathy: Secondary | ICD-10-CM | POA: Diagnosis not present

## 2019-05-13 DIAGNOSIS — M7552 Bursitis of left shoulder: Secondary | ICD-10-CM

## 2019-05-13 DIAGNOSIS — M25552 Pain in left hip: Secondary | ICD-10-CM | POA: Diagnosis not present

## 2019-05-13 DIAGNOSIS — M8580 Other specified disorders of bone density and structure, unspecified site: Secondary | ICD-10-CM

## 2019-05-13 IMAGING — DX DG HIP (WITH OR WITHOUT PELVIS) 2-3V LEFT
3 series · 3 of 3 positions shown · non-contrast
Comparison: [DATE]

CLINICAL DATA: Left hip pain, fall 3 weeks ago

EXAM:
DG HIP (WITH OR WITHOUT PELVIS) 2-3V LEFT

[pelvis ap]
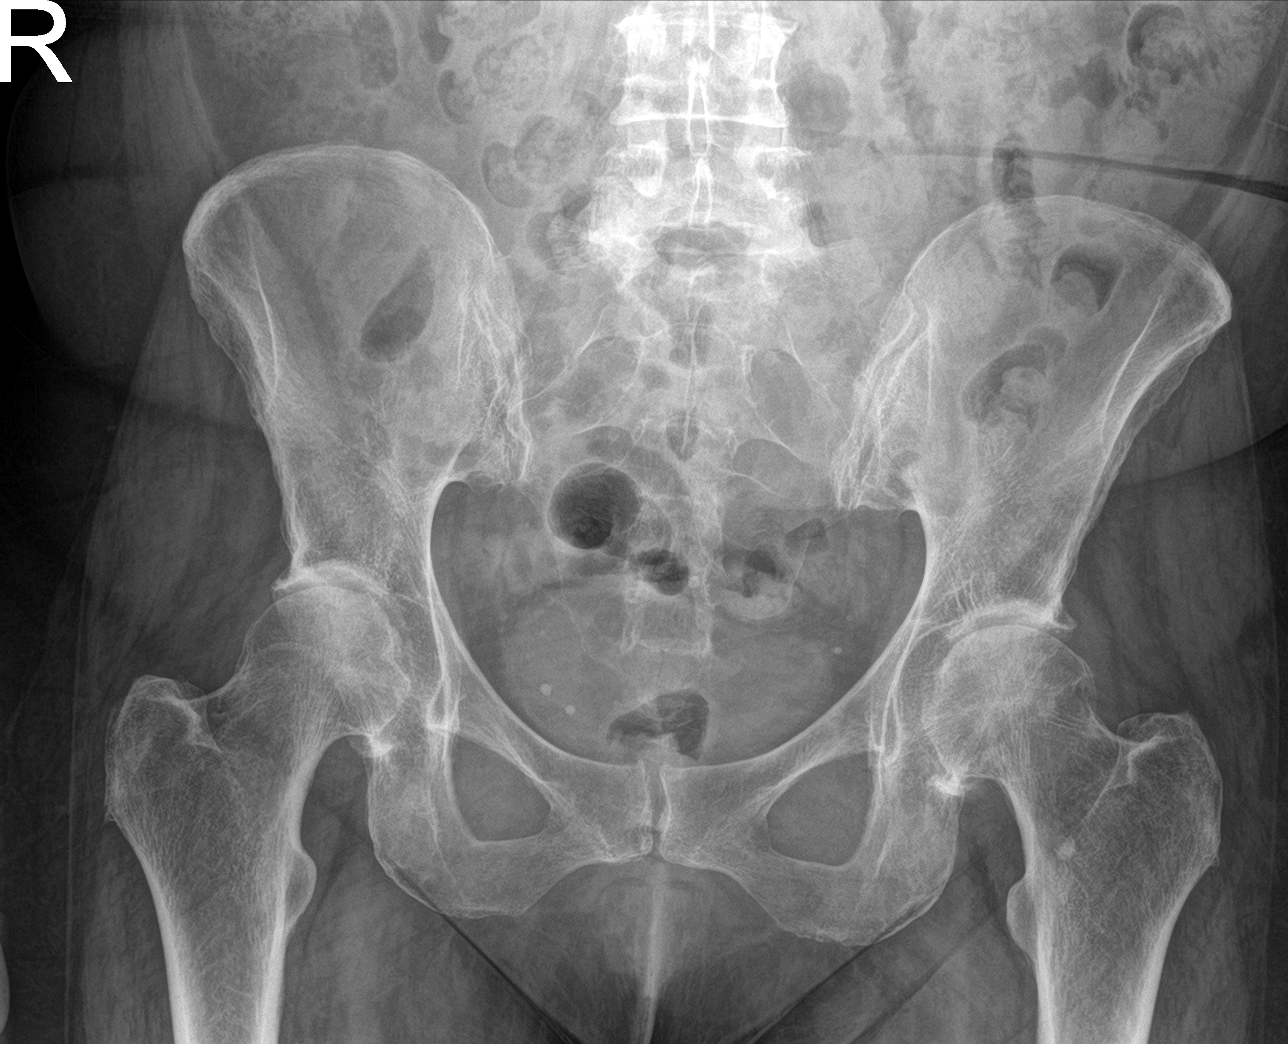

[hip ap]
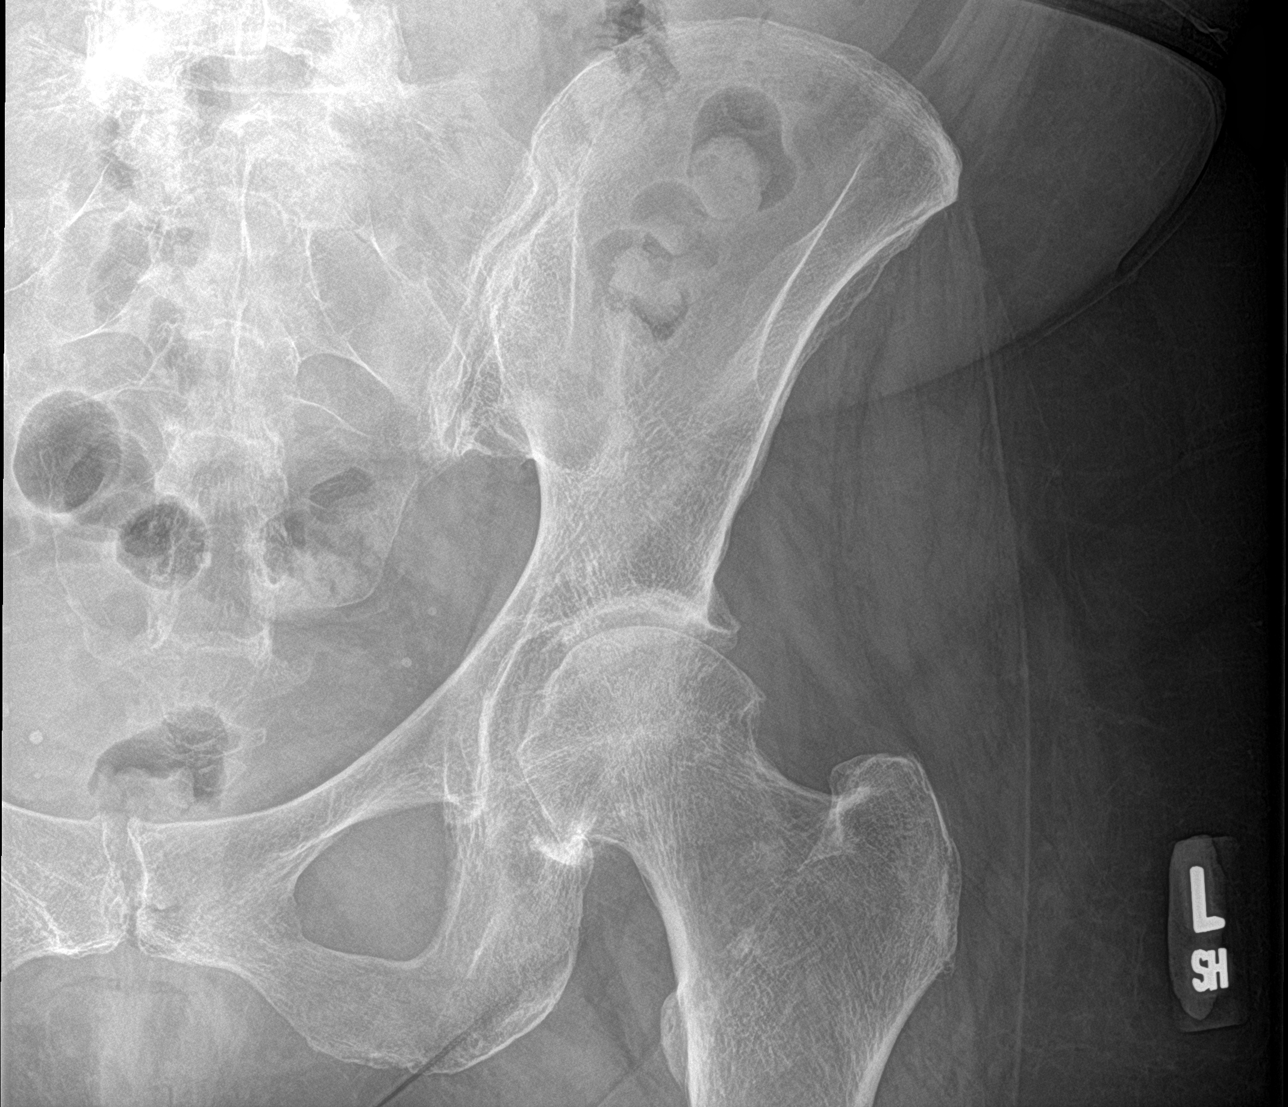

[hip lat]
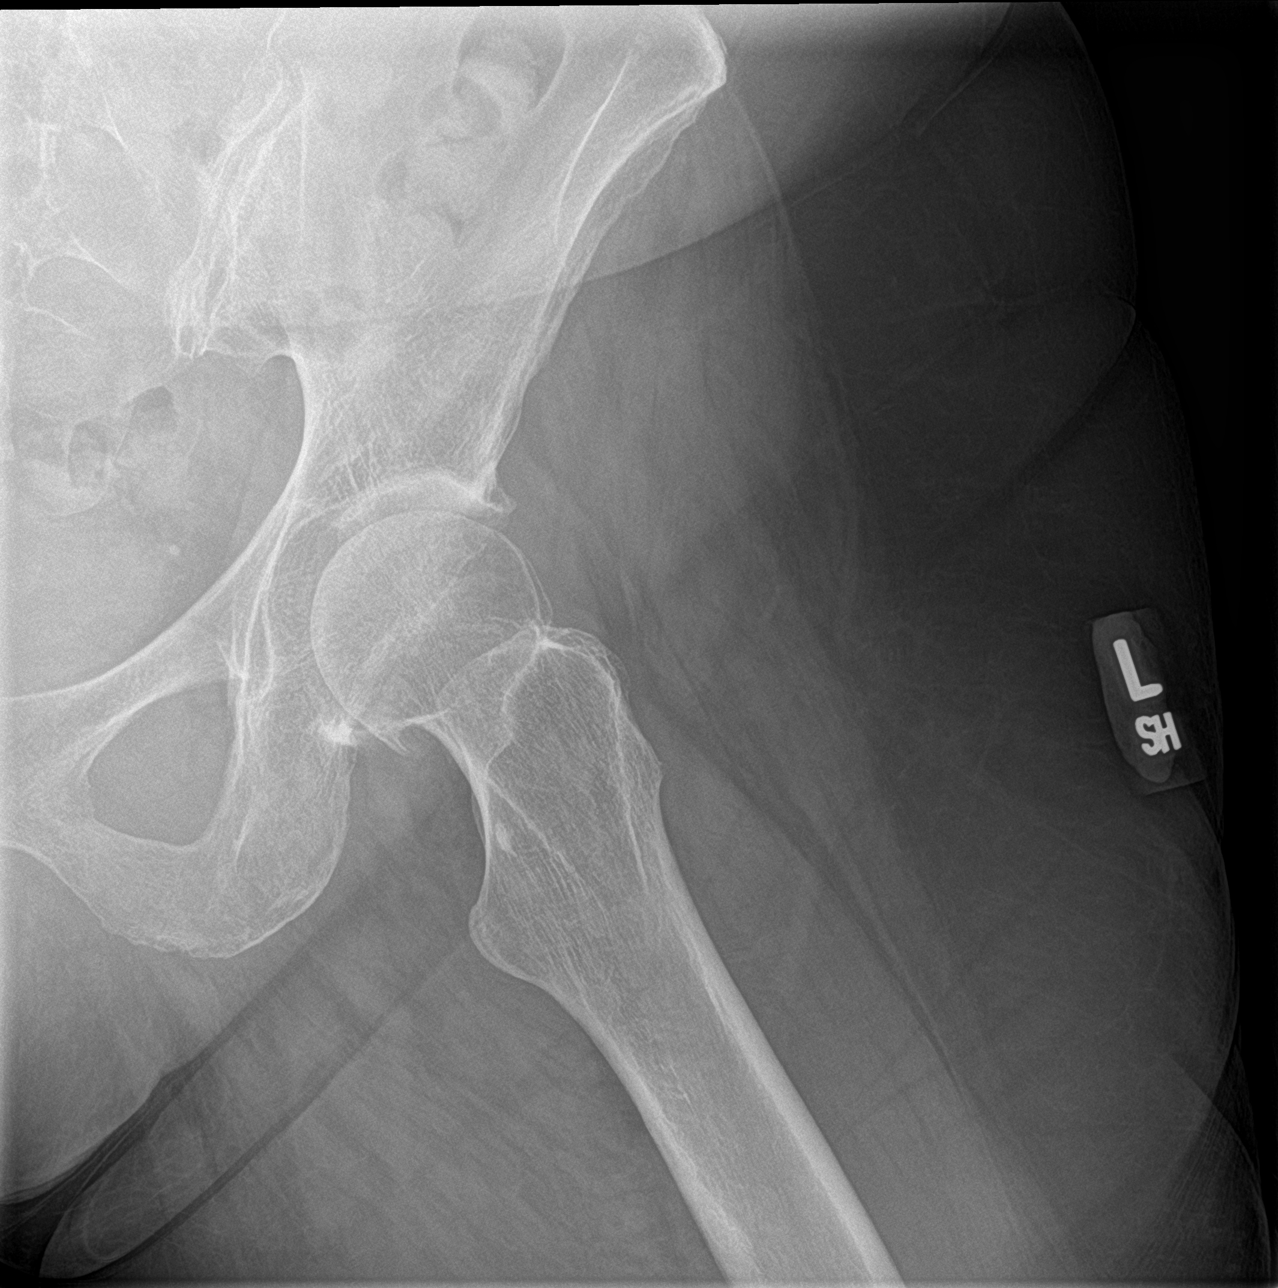

[3 of 3 positions shown; findings below may reference images not displayed]

FINDINGS: Moderate symmetric degenerative changes in the hips with joint space
narrowing and spurring. SI joints symmetric and unremarkable. No
acute bony abnormality. Specifically, no fracture, subluxation, or
dislocation.
IMPRESSION: Symmetric degenerative changes in the hips. No acute bony
abnormality.

## 2019-05-13 MED ORDER — CALCIUM CARBONATE-VITAMIN D 600-400 MG-UNIT PO TABS: 1.0000 | ORAL_TABLET | Freq: Two times a day (BID) | ORAL | 11 refills | Status: DC

## 2019-05-13 NOTE — Assessment & Plan Note (Signed)
Most likely accompanied by a degenerative labral tear. Hip joint injection today. Updated x-rays. Rehab exercises given. Return to see me in a month.

## 2019-05-13 NOTE — Assessment & Plan Note (Signed)
Improved considerably with ASO and rehab exercises. We will simply watch this for now, she still has mild symptoms.

## 2019-05-13 NOTE — Progress Notes (Signed)
Subjective:    CC: Multiple issues  HPI: Left peroneal tendinitis: Improved considerably with PT, still has a bit of discomfort.  Left hip pain: Localized in the groin, worse with standing, occasional catching and mechanical symptoms.  Oral analgesics are not effective.  Osteopenia: Needs some information on what is needed to treat this.  Left shoulder pain: Localized over the deltoid, worse with overhead activities, did not respond to conservative treatment.  I reviewed the past medical history, family history, social history, surgical history, and allergies today and no changes were needed.  Please see the problem list section below in epic for further details.  Past Medical History: No past medical history on file. Past Surgical History: Past Surgical History:  Procedure Laterality Date  . ABDOMINAL HYSTERECTOMY    . FOOT SURGERY    . SPLENECTOMY, TOTAL     Social History: Social History   Socioeconomic History  . Marital status: Married    Spouse name: Not on file  . Number of children: Not on file  . Years of education: Not on file  . Highest education level: Not on file  Occupational History  . Not on file  Social Needs  . Financial resource strain: Not on file  . Food insecurity:    Worry: Not on file    Inability: Not on file  . Transportation needs:    Medical: Not on file    Non-medical: Not on file  Tobacco Use  . Smoking status: Never Smoker  . Smokeless tobacco: Never Used  Substance and Sexual Activity  . Alcohol use: Not on file  . Drug use: Not on file  . Sexual activity: Not on file  Lifestyle  . Physical activity:    Days per week: Not on file    Minutes per session: Not on file  . Stress: Not on file  Relationships  . Social connections:    Talks on phone: Not on file    Gets together: Not on file    Attends religious service: Not on file    Active member of club or organization: Not on file    Attends meetings of clubs or  organizations: Not on file    Relationship status: Not on file  Other Topics Concern  . Not on file  Social History Narrative  . Not on file   Family History: No family history on file. Allergies: Allergies  Allergen Reactions  . Cephalosporins Hives  . Codeine Hives and Rash  . Penicillins Hives, Other (See Comments) and Rash    Hives   . Levofloxacin Rash and Other (See Comments)    tendonitis tendonitis unknown unknown   . Sulfa Antibiotics Rash   Medications: See med rec.  Review of Systems: No fevers, chills, night sweats, weight loss, chest pain, or shortness of breath.   Objective:    General: Well Developed, well nourished, and in no acute distress.  Neuro: Alert and oriented x3, extra-ocular muscles intact, sensation grossly intact.  HEENT: Normocephalic, atraumatic, pupils equal round reactive to light, neck supple, no masses, no lymphadenopathy, thyroid nonpalpable.  Skin: Warm and dry, no rashes. Cardiac: Regular rate and rhythm, no murmurs rubs or gallops, no lower extremity edema.  Respiratory: Clear to auscultation bilaterally. Not using accessory muscles, speaking in full sentences. Left hip: Exquisite pain with internal rotation.  Procedure: Real-time Ultrasound Guided injection of the left hip Device: GE Logiq E  Verbal informed consent obtained.  Time-out conducted.  Noted no overlying erythema, induration,  or other signs of local infection.  Skin prepped in a sterile fashion.  Local anesthesia: Topical Ethyl chloride.  With sterile technique and under real time ultrasound guidance:  1 cc Kenalog 40, 2 cc lidocaine, 2 cc bupivacaine injected easily Completed without difficulty  Pain immediately resolved suggesting accurate placement of the medication.  Advised to call if fevers/chills, erythema, induration, drainage, or persistent bleeding.  Images permanently stored and available for review in the ultrasound unit.  Impression: Technically  successful ultrasound guided injection.  Procedure: Real-time Ultrasound Guided injection of the left subacromial bursa Device: GE Logiq E  Verbal informed consent obtained.  Time-out conducted.  Noted no overlying erythema, induration, or other signs of local infection.  Skin prepped in a sterile fashion.  Local anesthesia: Topical Ethyl chloride.  With sterile technique and under real time ultrasound guidance:  1 cc Kenalog 40 bupivacaine injected easily Completed without difficulty  Pain immediately resolved suggesting accurate placement of the medication.  Advised to call if fevers/chills, erythema, induration, drainage, or persistent bleeding.  Images permanently stored and available for review in the ultrasound unit.  Impression: Technically successful ultrasound guided injection.  Impression and Recommendations:    Acute shoulder bursitis, left Left subacromial injection. New set of rehab exercises given, return in a month for this.  Primary osteoarthritis of left hip Most likely accompanied by a degenerative labral tear. Hip joint injection today. Updated x-rays. Rehab exercises given. Return to see me in a month.  Peroneal tendinitis, left Improved considerably with ASO and rehab exercises. We will simply watch this for now, she still has mild symptoms.  Osteopenia Adding calcium and vitamin D supplementation, she was taking too much vitamin D.   ___________________________________________ Gwen Her. Dianah Field, M.D., ABFM., CAQSM. Primary Care and Sports Medicine [facility]  Adjunct Professor of [employer] [employer] [facility] of Medicine

## 2019-05-13 NOTE — Assessment & Plan Note (Signed)
Adding calcium and vitamin D supplementation, she was taking too much vitamin D.

## 2019-05-13 NOTE — Assessment & Plan Note (Signed)
Left subacromial injection. New set of rehab exercises given, return in a month for this.

## 2019-05-13 NOTE — Patient Instructions (Addendum)
Osteopenia  Osteopenia is a loss of thickness (density) inside of the bones. Another name for osteopenia is low bone mass. Mild osteopenia is a normal part of aging. It is not a disease, and it does not cause symptoms. However, if you have osteopenia and continue to lose bone mass, you could develop a condition that causes the bones to become thin and break more easily (osteoporosis). You may also lose some height, have back pain, and have a stooped posture. Although osteopenia is not a disease, making changes to your lifestyle and diet can help to prevent osteopenia from developing into osteoporosis. What are the causes? Osteopenia is caused by loss of calcium in the bones.  Bones are constantly changing. Old bone cells are continually being replaced with new bone cells. This process builds new bone. The mineral calcium is needed to build new bone and maintain bone density. Bone density is usually highest around age 35. After that, most people's bodies cannot replace all the bone they have lost with new bone. What increases the risk? You are more likely to develop this condition if:  You are older than age 50.  You are a woman who went through menopause early.  You have a long illness that keeps you in bed.  You do not get enough exercise.  You lack certain nutrients (malnutrition).  You have an overactive thyroid gland (hyperthyroidism).  You smoke.  You drink a lot of alcohol.  You are taking medicines that weaken the bones, such as steroids. What are the signs or symptoms? This condition does not cause any symptoms. You may have a slightly higher risk for bone breaks (fractures), so getting fractures more easily than normal may be an indication of osteopenia. How is this diagnosed? Your health care provider can diagnose this condition with a special type of X-ray exam that measures bone density (dual-energy X-ray absorptiometry, DEXA). This test can measure bone density in your  hips, spine, and wrists. Osteopenia has no symptoms, so this condition is usually diagnosed after a routine bone density screening test is done for osteoporosis. This routine screening is usually done for:  Women who are age 65 or older.  Men who are age 70 or older. If you have risk factors for osteopenia, you may have the screening test at an earlier age. How is this treated? Making dietary and lifestyle changes can lower your risk for osteoporosis. If you have severe osteopenia that is close to becoming osteoporosis, your health care provider may prescribe medicines and dietary supplements such as calcium and vitamin D. These supplements help to rebuild bone density. Follow these instructions at home:   Take over-the-counter and prescription medicines only as told by your health care provider. These include vitamins and supplements.  Eat a diet that is high in calcium and vitamin D. ? Calcium is found in dairy products, beans, salmon, and leafy green vegetables like spinach and broccoli. ? Look for foods that have vitamin D and calcium added to them (fortified foods), such as orange juice, cereal, and bread.  Do 30 or more minutes of a weight-bearing exercise every day, such as walking, jogging, or playing a sport. These types of exercises strengthen the bones.  Take precautions at home to lower your risk of falling, such as: ? Keeping rooms well-lit and free of clutter, such as cords. ? Installing safety rails on stairs. ? Using rubber mats in the bathroom or other areas that are often wet or slippery.  Do not use   any products that contain nicotine or tobacco, such as cigarettes and e-cigarettes. If you need help quitting, ask your health care provider.  Avoid alcohol or limit alcohol intake to no more than 1 drink a day for nonpregnant women and 2 drinks a day for men. One drink equals 12 oz of beer, 5 oz of wine, or 1 oz of hard liquor.  Keep all follow-up visits as told by your  health care provider. This is important. Contact a health care provider if:  You have not had a bone density screening for osteoporosis and you are: ? A woman, age 39 or older. ? A man, age 48 or older.  You are a postmenopausal woman who has not had a bone density screening for osteoporosis.  You are older than age 9 and you want to know if you should have bone density screening for osteoporosis. Summary  Osteopenia is a loss of thickness (density) inside of the bones. Another name for osteopenia is low bone mass.  Osteopenia is not a disease, but it may increase your risk for a condition that causes the bones to become thin and break more easily (osteoporosis).  You may be at risk for osteopenia if you are older than age 30 or if you are a woman who went through early menopause.  Osteopenia does not cause any symptoms, but it can be diagnosed with a bone density screening test.  Dietary and lifestyle changes are the first treatment for osteopenia. These may lower your risk for osteoporosis. This information is not intended to replace advice given to you by your health care provider. Make sure you discuss any questions you have with your health care provider. Document Released: 09/11/2017 Document Revised: 09/11/2017 Document Reviewed: 09/11/2017 Elsevier Interactive Patient Education  2019 [place] protect organs, store calcium, anchor muscles, and support the whole body. Keeping your bones strong is important, especially as you get older. You can take actions to help keep your bones strong and healthy. Why is keeping my bones healthy important?  Keeping your bones healthy is important because your body constantly replaces bone cells. Cells get old, and new cells take their place. As we age, we lose bone cells because the body may not be able to make enough new cells to replace the old cells. The amount of bone cells and bone tissue you have is referred to  as bone mass. The higher your bone mass, the stronger your bones. The aging process leads to an overall loss of bone mass in the body, which can increase the likelihood of:  Joint pain and stiffness.  Broken bones.  A condition in which the bones become weak and brittle (osteoporosis). A large decline in bone mass occurs in older adults. In women, it occurs about the time of menopause. What actions can I take to keep my bones healthy? Good health habits are important for maintaining healthy bones. This includes eating nutritious foods and exercising regularly. To have healthy bones, you need to get enough of the right minerals and vitamins. Most nutrition experts recommend getting these nutrients from the foods that you eat. In some cases, taking supplements may also be recommended. Doing certain types of exercise is also important for bone health. What are the nutritional recommendations for healthy bones?  Eating a well-balanced diet with plenty of calcium and vitamin D will help to protect your bones. Nutritional recommendations vary from person to person. Ask your health care provider what  is healthy for you. Here are some general guidelines. Get enough calcium Calcium is the most important (essential) mineral for bone health. Most people can get enough calcium from their diet, but supplements may be recommended for people who are at risk for osteoporosis. Good sources of calcium include:  Dairy products, such as low-fat or nonfat milk, cheese, and yogurt.  Dark green leafy vegetables, such as bok choy and broccoli.  Calcium-fortified foods, such as orange juice, cereal, bread, soy beverages, and tofu products.  Nuts, such as almonds. Follow these recommended amounts for daily calcium intake:  Children, age 58-3: 700 mg.  Children, age 47-8: 1,000 mg.  Children, age 61-13: 1,300 mg.  Teens, age 65-18: 1,300 mg.  Adults, age 587-50: 1,000 mg.  Adults, age 27-70: ? Men: 1,000 mg.  ? Women: 1,200 mg.  Adults, age 28 or older: 1,200 mg.  Pregnant and breastfeeding females: ? Teens: 1,300 mg. ? Adults: 1,000 mg. Get enough vitamin D Vitamin D is the most essential vitamin for bone health. It helps the body absorb calcium. Sunlight stimulates the skin to make vitamin D, so be sure to get enough sunlight. If you live in a cold climate or you do not get outside often, your health care provider may recommend that you take vitamin D supplements. Good sources of vitamin D in your diet include:  Egg yolks.  Saltwater fish.  Milk and cereal fortified with vitamin D. Follow these recommended amounts for daily vitamin D intake:  Children and teens, age 58-18: 600 international units.  Adults, age 6 or younger: 400-800 international units.  Adults, age 582 or older: 800-1,000 international units. Get other important nutrients Other nutrients that are important for bone health include:  Phosphorus. This mineral is found in meat, poultry, dairy foods, nuts, and legumes. The recommended daily intake for adult men and adult women is 700 mg.  Magnesium. This mineral is found in seeds, nuts, dark green vegetables, and legumes. The recommended daily intake for adult men is 400-420 mg. For adult women, it is 310-320 mg.  Vitamin K. This vitamin is found in green leafy vegetables. The recommended daily intake is 120 mg for adult men and 90 mg for adult women. What type of physical activity is best for building and maintaining healthy bones? Weight-bearing and strength-building activities are important for building and maintaining healthy bones. Weight-bearing activities cause muscles and bones to work against gravity. Strength-building activities increase the strength of the muscles that support bones. Weight-bearing and muscle-building activities include:  Walking and hiking.  Jogging and running.  Dancing.  Gym exercises.  Lifting weights.  Tennis and racquetball.   Climbing stairs.  Aerobics. Adults should get at least 30 minutes of moderate physical activity on most days. Children should get at least 60 minutes of moderate physical activity on most days. Ask your health care provider what type of exercise is best for you. How can I find out if my bone mass is low? Bone mass can be measured with an X-ray test called a bone mineral density (BMD) test. This test is recommended for all women who are age 478 or older. It may also be recommended for:  Men who are age 474 or older.  People who are at risk for osteoporosis because of: ? Having bones that break easily. ? Having a long-term disease that weakens bones, such as kidney disease or rheumatoid arthritis. ? Having menopause earlier than normal. ? Taking medicine that weakens bones, such as steroids, thyroid  hormones, or hormone treatment for breast cancer or prostate cancer. ? Smoking. ? Drinking three or more alcoholic drinks a day. If you find that you have a low bone mass, you may be able to prevent osteoporosis or further bone loss by changing your diet and lifestyle. Where can I find more information? For more information, check out the following websites:  [place]: AviationTales.fr  [employer] of Health: www.bones.SouthExposed.es  International Osteoporosis Foundation: Administrator.iofbonehealth.org Summary  The aging process leads to an overall loss of bone mass in the body, which can increase the likelihood of broken bones and osteoporosis.  Eating a well-balanced diet with plenty of calcium and vitamin D will help to protect your bones.  Weight-bearing and strength-building activities are also important for building and maintaining strong bones.  Bone mass can be measured with an X-ray test called a bone mineral density (BMD) test. This information is not intended to replace advice given to you by your health care provider. Make sure you discuss any questions you  have with your health care provider. Document Released: 02/23/2004 Document Revised: 12/30/2017 Document Reviewed: 12/30/2017 Elsevier Interactive Patient Education  2019 [employer].

## 2019-05-21 DIAGNOSIS — N644 Mastodynia: Secondary | ICD-10-CM | POA: Diagnosis not present

## 2019-05-21 DIAGNOSIS — R1011 Right upper quadrant pain: Secondary | ICD-10-CM | POA: Diagnosis not present

## 2019-05-22 ENCOUNTER — Other Ambulatory Visit: Payer: Self-pay | Admitting: Surgery

## 2019-05-22 DIAGNOSIS — N644 Mastodynia: Secondary | ICD-10-CM

## 2019-05-26 ENCOUNTER — Other Ambulatory Visit: Payer: Self-pay | Admitting: Surgery

## 2019-05-26 DIAGNOSIS — R1011 Right upper quadrant pain: Secondary | ICD-10-CM

## 2019-05-27 ENCOUNTER — Telehealth: Payer: Self-pay | Admitting: Licensed Clinical Social Worker

## 2019-05-27 NOTE — Telephone Encounter (Signed)
Received a genetic counseling referral from Dr. Ninfa Linden at [place] for breast pain. Pt has been cld and scheduled to see Faith Rogue on 7/6 at 11am for a webex visit. Pt has been made aware someone will call her to set up the webex visit. Will notify [place] about the pt's insurance.

## 2019-06-05 ENCOUNTER — Ambulatory Visit
Admission: RE | Admit: 2019-06-05 | Discharge: 2019-06-05 | Disposition: A | Discharge status: Home / Self Care | Payer: Medicare Other | Source: Ambulatory Visit | Attending: Surgery | Admitting: Surgery

## 2019-06-05 ENCOUNTER — Ambulatory Visit (HOSPITAL_COMMUNITY): Payer: Medicare Other | Attending: Surgery

## 2019-06-05 ENCOUNTER — Other Ambulatory Visit: Payer: Self-pay | Admitting: Surgery

## 2019-06-05 ENCOUNTER — Ambulatory Visit: Payer: Medicare Other

## 2019-06-05 ENCOUNTER — Other Ambulatory Visit: Payer: Self-pay

## 2019-06-05 DIAGNOSIS — N644 Mastodynia: Secondary | ICD-10-CM

## 2019-06-05 DIAGNOSIS — R1011 Right upper quadrant pain: Secondary | ICD-10-CM

## 2019-06-05 DIAGNOSIS — R1013 Epigastric pain: Secondary | ICD-10-CM | POA: Diagnosis not present

## 2019-06-05 DIAGNOSIS — R922 Inconclusive mammogram: Secondary | ICD-10-CM | POA: Diagnosis not present

## 2019-06-05 IMAGING — US ULTRASOUND LEFT BREAST LIMITED
1 series · 3 of 3 positions shown · non-contrast
Comparison: Previous exam(s).

CLINICAL DATA: 67-year-old female intermittent pain in the left
breast for 1-2 months. The patient has a family history of breast
cancer in her mother.

EXAM:
DIGITAL DIAGNOSTIC BILATERAL MAMMOGRAM WITH CAD AND TOMO
ULTRASOUND LEFT BREAST

[Series 1: ultrasound left breast limited · 0.07mm/px · 3 of 3 slices shown]
[im 1/3]
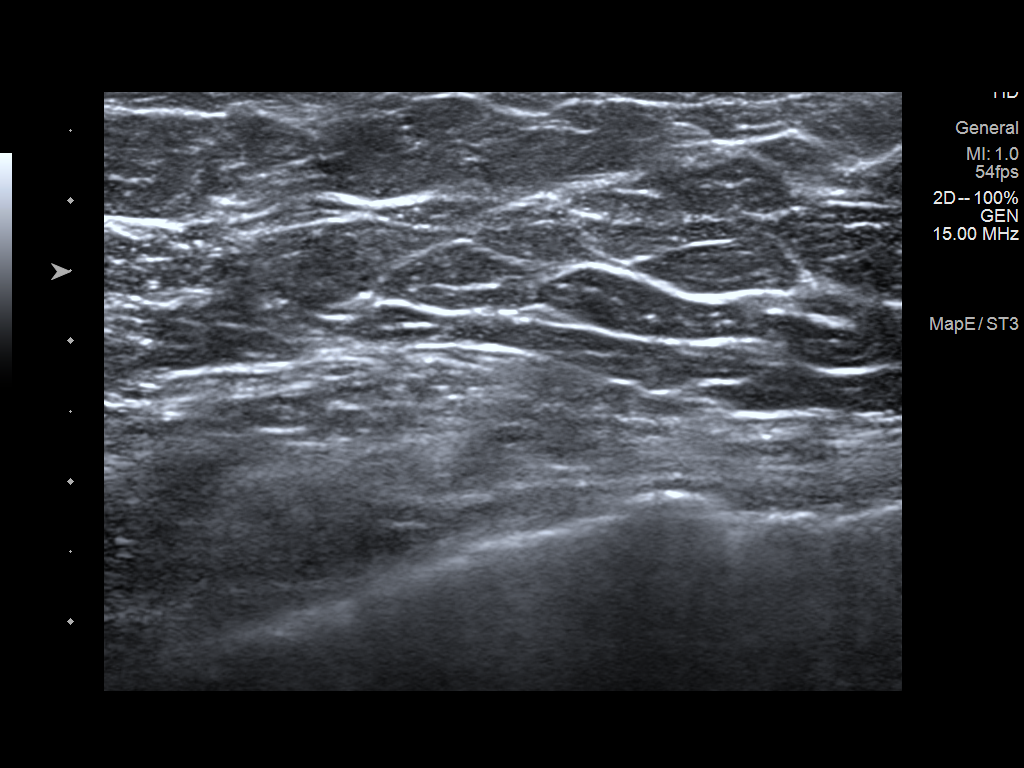
[im 2/3]
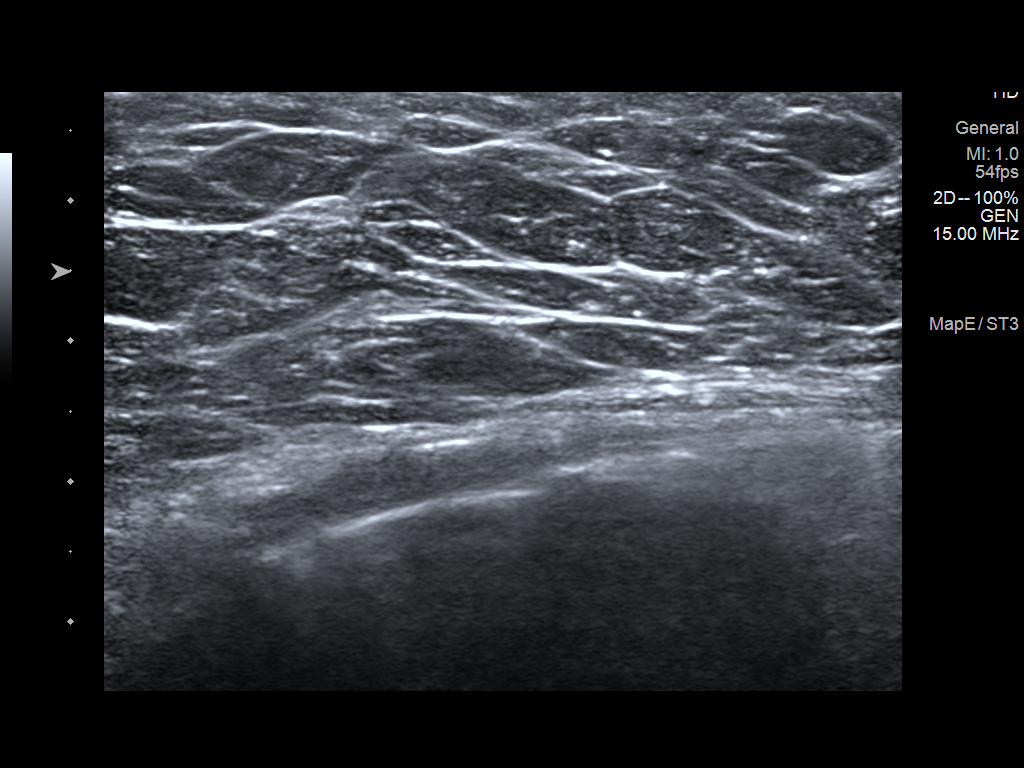
[im 3/3]
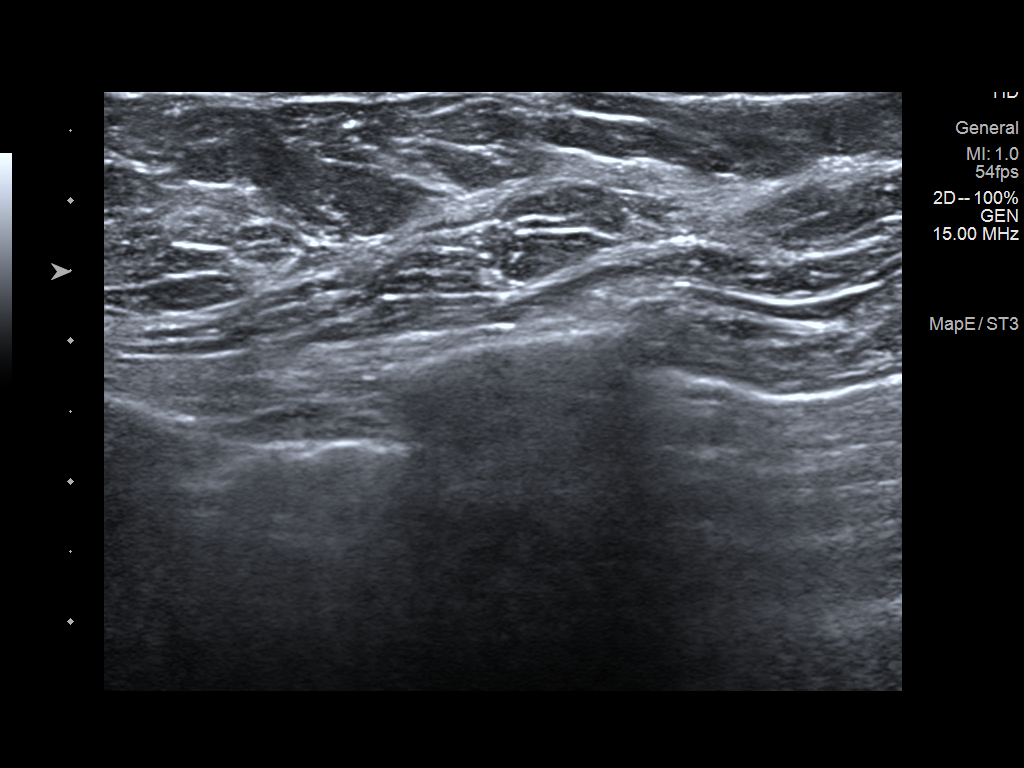

[3 of 3 positions shown; findings below may reference images not displayed]

ACR Breast Density Category c: The breast tissue is heterogeneously
dense, which may obscure small masses.
FINDINGS: No suspicious mammographic findings are identified in either breast.
The parenchymal pattern is stable.

Mammographic images were processed with CAD.

Targeted ultrasound is performed, showing normal fatty and
fibroglandular tissue without focal or suspicious sonographic
abnormality. Evaluation of the lower left breast was performed.
IMPRESSION: 1. No mammographic evidence of malignancy in either breast.
2. No suspicious sonographic findings at the site of the patient's
left breast pain.

RECOMMENDATION:
1. Clinical follow-up recommended for the painful area of concern in
the left breast. Any further workup should be based on clinical
grounds.
2.  Screening mammogram in one year.(Code:[SL])

I have discussed the findings and recommendations with the patient.
Results were also provided in writing at the conclusion of the
visit. If applicable, a reminder letter will be sent to the patient
regarding the next appointment.

BI-RADS CATEGORY  1: Negative.

## 2019-06-05 IMAGING — MG DIGITAL DIAGNOSTIC BILATERAL MAMMOGRAM WITH TOMO AND CAD
8 series · 8 of 24 positions shown · non-contrast
Comparison: Previous exam(s).

CLINICAL DATA: 67-year-old female intermittent pain in the left
breast for 1-2 months. The patient has a family history of breast
cancer in her mother.

EXAM:
DIGITAL DIAGNOSTIC BILATERAL MAMMOGRAM WITH CAD AND TOMO
ULTRASOUND LEFT BREAST

[L MLO synth-2D]
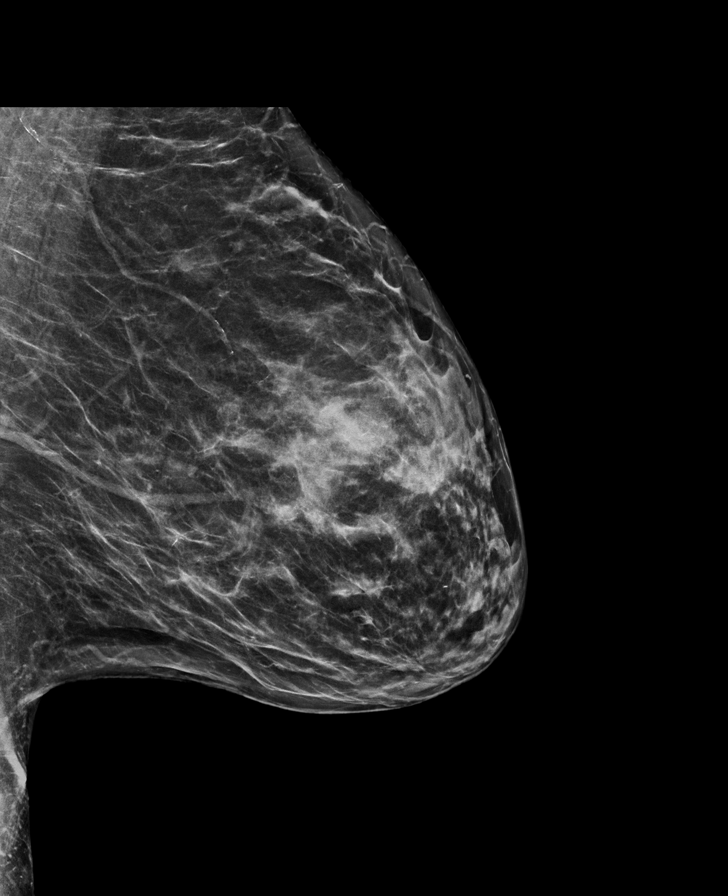

[R CC synth-2D]
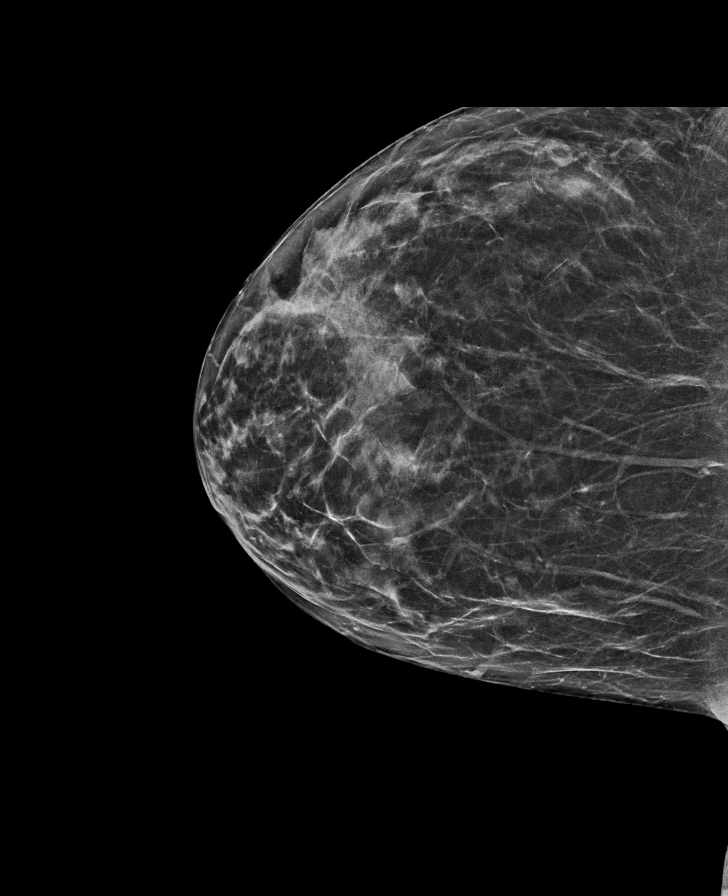

[L CC synth-2D]
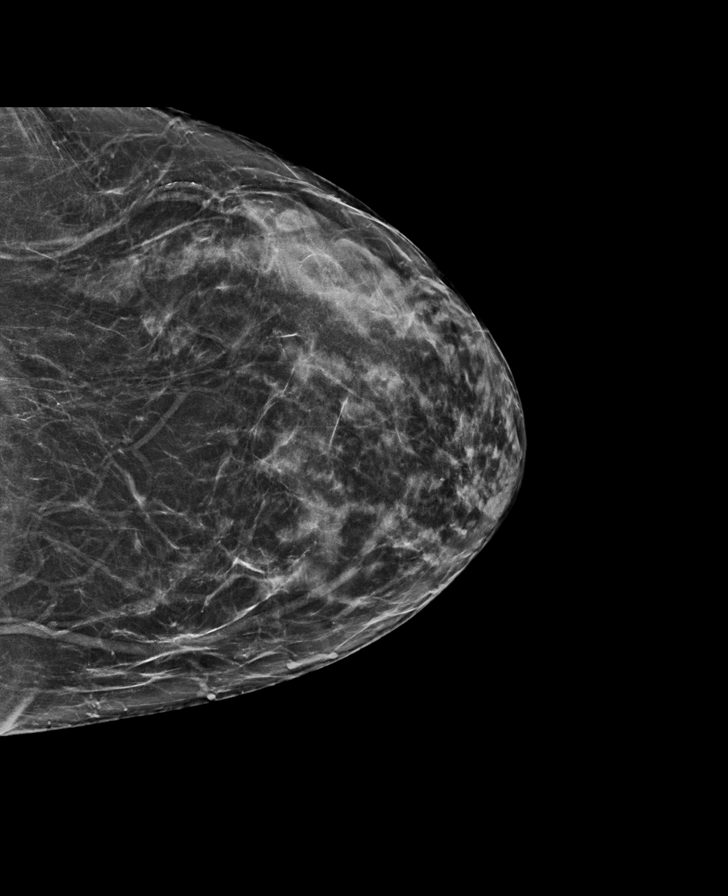

[R MLO synth-2D]
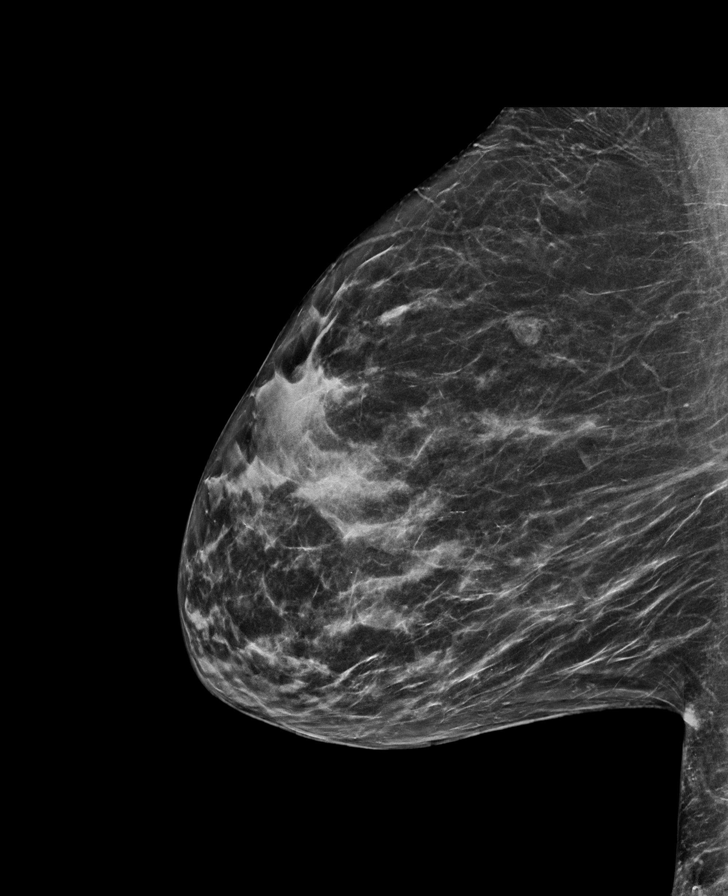

[L MLO tomo · tomo slice 33/65.0]
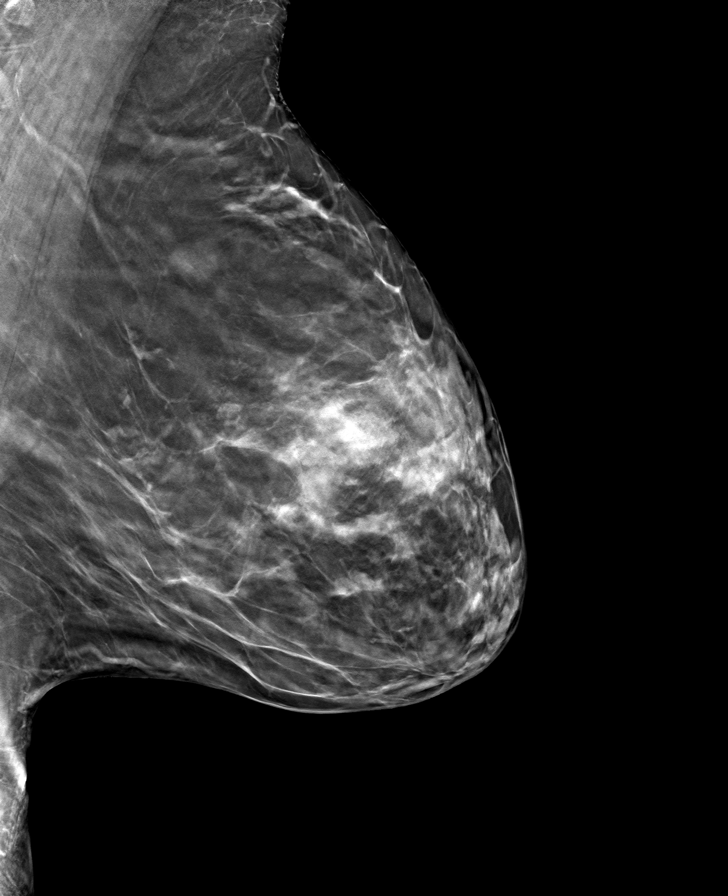

[R CC tomo · tomo slice 34/67.0]
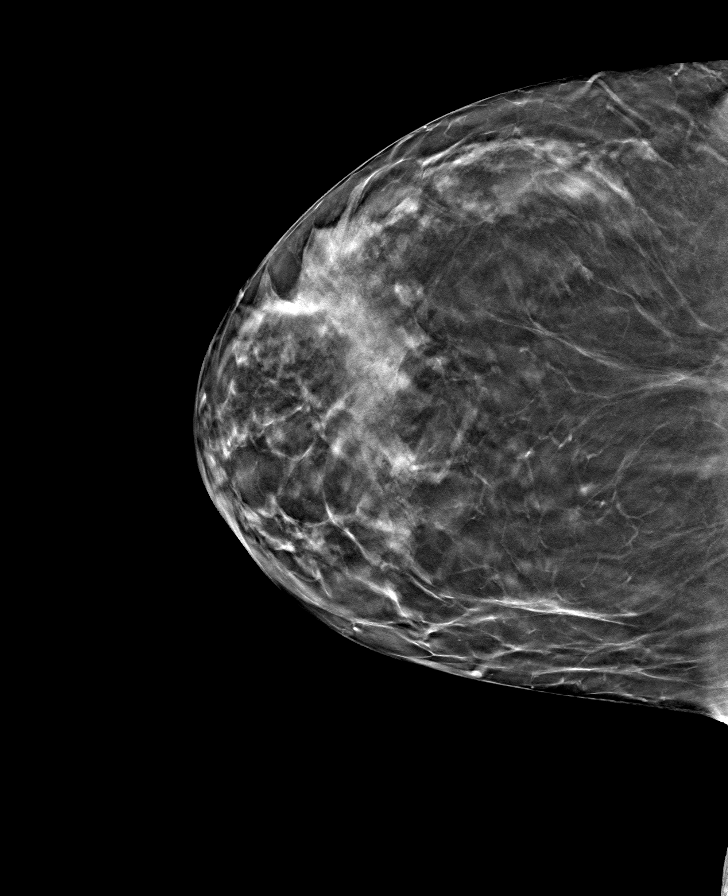

[R MLO tomo · tomo slice 35/69.0]
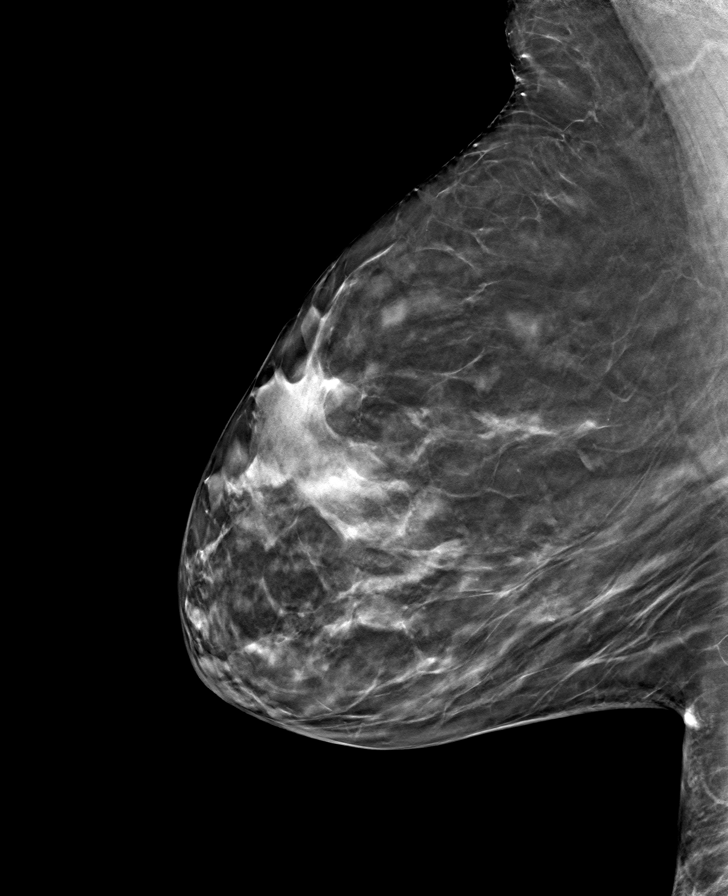

[L CC tomo · tomo slice 34/67.0]
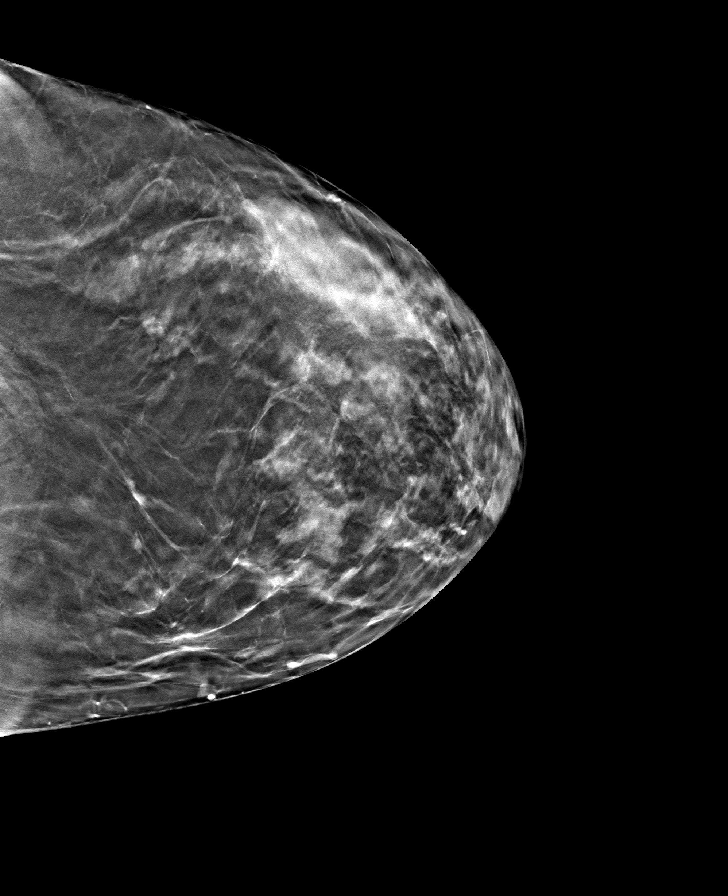

[8 of 24 positions shown; findings below may reference images not displayed]

ACR Breast Density Category c: The breast tissue is heterogeneously
dense, which may obscure small masses.
FINDINGS: No suspicious mammographic findings are identified in either breast.
The parenchymal pattern is stable.

Mammographic images were processed with CAD.

Targeted ultrasound is performed, showing normal fatty and
fibroglandular tissue without focal or suspicious sonographic
abnormality. Evaluation of the lower left breast was performed.
IMPRESSION: 1. No mammographic evidence of malignancy in either breast.
2. No suspicious sonographic findings at the site of the patient's
left breast pain.

RECOMMENDATION:
1. Clinical follow-up recommended for the painful area of concern in
the left breast. Any further workup should be based on clinical
grounds.
2.  Screening mammogram in one year.(Code:[SL])

I have discussed the findings and recommendations with the patient.
Results were also provided in writing at the conclusion of the
visit. If applicable, a reminder letter will be sent to the patient
regarding the next appointment.

BI-RADS CATEGORY  1: Negative.

## 2019-06-10 ENCOUNTER — Ambulatory Visit (INDEPENDENT_AMBULATORY_CARE_PROVIDER_SITE_OTHER): Payer: Medicare Other | Admitting: Sports Medicine

## 2019-06-10 ENCOUNTER — Encounter: Payer: Self-pay | Admitting: Sports Medicine

## 2019-06-10 DIAGNOSIS — M7552 Bursitis of left shoulder: Secondary | ICD-10-CM

## 2019-06-10 DIAGNOSIS — M1612 Unilateral primary osteoarthritis, left hip: Secondary | ICD-10-CM

## 2019-06-10 NOTE — Assessment & Plan Note (Signed)
Now totally resolved with injection rehab exercises, return as needed.

## 2019-06-10 NOTE — Assessment & Plan Note (Signed)
Likely with a degenerative labral tear, completely resolved after her hip joint injection at the last visit. Continue rehab exercises and return as needed.

## 2019-06-10 NOTE — Progress Notes (Signed)
Subjective:    CC: Follow-up  HPI: Subacromial bursitis and hip osteoarthritis: Both are resolved after injections at the last visit.  I reviewed the past medical history, family history, social history, surgical history, and allergies today and no changes were needed.  Please see the problem list section below in epic for further details.  Past Medical History: No past medical history on file. Past Surgical History: Past Surgical History:  Procedure Laterality Date  . ABDOMINAL HYSTERECTOMY    . FOOT SURGERY    . SPLENECTOMY, TOTAL     Social History: Social History   Socioeconomic History  . Marital status: Married    Spouse name: Not on file  . Number of children: Not on file  . Years of education: Not on file  . Highest education level: Not on file  Occupational History  . Not on file  Social Needs  . Financial resource strain: Not on file  . Food insecurity    Worry: Not on file    Inability: Not on file  . Transportation needs    Medical: Not on file    Non-medical: Not on file  Tobacco Use  . Smoking status: Never Smoker  . Smokeless tobacco: Never Used  Substance and Sexual Activity  . Alcohol use: Not on file  . Drug use: Not on file  . Sexual activity: Not on file  Lifestyle  . Physical activity    Days per week: Not on file    Minutes per session: Not on file  . Stress: Not on file  Relationships  . Social Herbalist on phone: Not on file    Gets together: Not on file    Attends religious service: Not on file    Active member of club or organization: Not on file    Attends meetings of clubs or organizations: Not on file    Relationship status: Not on file  Other Topics Concern  . Not on file  Social History Narrative  . Not on file   Family History: Family History  Problem Relation Age of Onset  . Breast cancer Mother 21   Allergies: Allergies  Allergen Reactions  . Cephalosporins Hives  . Codeine Hives and Rash  .  Penicillins Hives, Other (See Comments) and Rash    Hives   . Levofloxacin Rash and Other (See Comments)    tendonitis tendonitis unknown unknown   . Sulfa Antibiotics Rash   Medications: See med rec.  Review of Systems: No fevers, chills, night sweats, weight loss, chest pain, or shortness of breath.   Objective:    General: Well Developed, well nourished, and in no acute distress.  Neuro: Alert and oriented x3, extra-ocular muscles intact, sensation grossly intact.  HEENT: Normocephalic, atraumatic, pupils equal round reactive to light, neck supple, no masses, no lymphadenopathy, thyroid nonpalpable.  Skin: Warm and dry, no rashes. Cardiac: Regular rate and rhythm, no murmurs rubs or gallops, no lower extremity edema.  Respiratory: Clear to auscultation bilaterally. Not using accessory muscles, speaking in full sentences.  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 Continue rehab exercises and return as needed.  Acute shoulder bursitis, left Now totally resolved with injection rehab exercises, return as needed.   ___________________________________________ Gwen Her. Dianah Field, M.D., ABFM., CAQSM. Primary Care and Sports Medicine [facility]  Adjunct Professor of [facility] Medicine  [employer]

## 2019-06-18 ENCOUNTER — Telehealth: Payer: Self-pay | Admitting: Licensed Clinical Social Worker

## 2019-06-18 NOTE — Telephone Encounter (Signed)
Called patient regarding upcoming Webex appointment, patient is notified and e-mail has been sent. °

## 2019-06-22 ENCOUNTER — Inpatient Hospital Stay: Payer: Medicare Other | Attending: Genetic Counselor | Admitting: Licensed Clinical Social Worker

## 2019-06-22 ENCOUNTER — Encounter: Payer: Self-pay | Admitting: Licensed Clinical Social Worker

## 2019-06-22 DIAGNOSIS — Z803 Family history of malignant neoplasm of breast: Secondary | ICD-10-CM | POA: Diagnosis not present

## 2019-06-22 NOTE — Progress Notes (Signed)
REFERRING PROVIDER: Coralie Keens, MD [address] [place],  [place] [address]  PRIMARY PROVIDER:  Drake Leach, MD  PRIMARY REASON FOR VISIT:  1. Family history of breast cancer    I connected with Ms. Simek on 06/22/2019 at 11:00 AM EDT by Jackquline Denmark and verified that I am speaking with the correct person using two identifiers.    Patient location: home Provider location: clinic   HISTORY OF PRESENT ILLNESS:   Ms. Lackie, a 68 y.o. female, was seen for a [place] cancer genetics consultation at the request of Dr. Ninfa Linden due to a family history of cancer.  Ms. Soltis presents to clinic today to discuss the possibility of a hereditary predisposition to cancer, genetic testing, and to further clarify her future cancer risks, as well as potential cancer risks for family members.   Ms. Hargrove is a 68 y.o. female with no personal history of cancer.  She notes having two lumpectomies, one in her late teens and one in her early 71s, both were benign and related to breast cysts. She took tamoxifen for 5 years from 2000-2005.  CANCER HISTORY:  Oncology History   No history exists.    RISK FACTORS:  Menarche was at age 32.  First live birth at age 43.  OCP use for approximately 1 years.  Ovaries intact: yes.  Hysterectomy: yes.  Menopausal status: postmenopausal.  HRT use: 0 years. Colonoscopy: yes; she notes one polyp found last year. Mammogram within the last year: yes. Any excessive radiation exposure in the past: no  Past Medical History:  Diagnosis Date  . Family history of breast cancer     Past Surgical History:  Procedure Laterality Date  . ABDOMINAL HYSTERECTOMY    . FOOT SURGERY    . SPLENECTOMY, TOTAL      Social History   Socioeconomic History  . Marital status: Married    Spouse name: Not on file  . Number of children: Not on file  . Years of education: Not on file  . Highest education level: Not on file  Occupational History  . Not on file   Social Needs  . Financial resource strain: Not on file  . Food insecurity    Worry: Not on file    Inability: Not on file  . Transportation needs    Medical: Not on file    Non-medical: Not on file  Tobacco Use  . Smoking status: Never Smoker  . Smokeless tobacco: Never Used  Substance and Sexual Activity  . Alcohol use: Not on file  . Drug use: Not on file  . Sexual activity: Not on file  Lifestyle  . Physical activity    Days per week: Not on file    Minutes per session: Not on file  . Stress: Not on file  Relationships  . Social Herbalist on phone: Not on file    Gets together: Not on file    Attends religious service: Not on file    Active member of club or organization: Not on file    Attends meetings of clubs or organizations: Not on file    Relationship status: Not on file  Other Topics Concern  . Not on file  Social History Narrative  . Not on file     FAMILY HISTORY:  We obtained a detailed, 4-generation family history.  Significant diagnoses are listed below: Family History  Problem Relation Age of Onset  . Breast cancer Mother 10  .  Breast cancer Paternal Aunt   . Breast cancer Paternal Grandmother        dx 18s    Ms. Inghram has one son, age 25, and 2 daughters, ages 69 and 55. No cancers. She has one sister, age 7, one brother, age 8, no cancer diagnoses.  Ms. Kuenzel mother was diagnosed with breast cancer at 79 and died at 39. The patient had one maternal uncle who died in [place] War II. No cancers in maternal cousins. Her maternal grandmother died in her 16s, maternal grandfather died in his late 34s of pneumonia.  Ms. Tropea father is deceased, no cancer history. She has limited information about this side of the family. The patient had 2 paternal uncles, 1 paternal aunt. Her aunt had breast cancer, she is unsure of age of diagnosis or death. Her paternal grandmother also had breast cancer, diagnosed in her 25s and died in her 29s. Her  paternal grandfather also died at a relatively young age but she is unsure of cause.    Ms. Zabriskie is unaware of previous family history of genetic testing for hereditary cancer risks. Patient's maternal ancestors are of [place] descent, and paternal ancestors are of [place] descent. There is no reported Ashkenazi Jewish ancestry. There is no known consanguinity.  GENETIC COUNSELING ASSESSMENT: Ms. Spadaccini is a 68 y.o. female with a family history which is somewhat suggestive of a hereditary cancer syndrome and predisposition to cancer. We, therefore, discussed and recommended the following at today's visit.   DISCUSSION: We discussed that 5 - 10% of breast cancer is hereditary, with most cases associated with BRCA1/BRCA2.  There are other genes that can be associated with hereditary breast cancer syndromes.  These include PALB2, ATM, CHEK2, and more.    We reviewed the characteristics, features and inheritance patterns of hereditary cancer syndromes. We also discussed genetic testing, including the appropriate family members to test, the process of testing, insurance coverage and turn-around-time for results. We discussed the implications of a negative, positive and/or variant of uncertain significant result. We recommended Ms. Petion pursue genetic testing for the Common Hereditary Cancers gene panel.   The Common Hereditary Cancers Panel offered by [facility] includes sequencing and/or deletion duplication testing of the following 48 genes: APC, ATM, AXIN2, BARD1, BMPR1A, BRCA1, BRCA2, BRIP1, CDH1, CDKN2A (p14ARF), CDKN2A (p16INK4a), CKD4, CHEK2, CTNNA1, DICER1, EPCAM (Deletion/duplication testing only), GREM1 (promoter region deletion/duplication testing only), KIT, MEN1, MLH1, MSH2, MSH3, MSH6, MUTYH, NBN, NF1, NHTL1, PALB2, PDGFRA, PMS2, POLD1, POLE, PTEN, RAD50, RAD51C, RAD51D, RNF43, SDHB, SDHC, SDHD, SMAD4, SMARCA4. STK11, TP53, TSC1, TSC2, and VHL.  The following genes were evaluated for sequence  changes only: SDHA and HOXB13 c.251G>A variant only.  Based on Ms. Inboden family history of cancer, she meets medical criteria for genetic testing. Despite that she meets criteria, she may still have an out of pocket cost.  Based on the patient's family history, a statistical model Air cabin crew) was used to estimate her risk of developing breast cancer. This estimates her lifetime risk of developing breast cancer to be approximately 13.8%. This estimation is in the setting of negative genetic test results and may change based on results  The patient's lifetime breast cancer risk is a preliminary estimate based on available information using one of several models endorsed by the [place] (ACS). The ACS recommends consideration of breast MRI screening as an adjunct to mammography for patients at high risk (defined as 20% or greater lifetime risk).      PLAN:  After considering the risks, benefits, and limitations, Ms. Hege provided informed consent to pursue genetic testing. A saliva kit will be mailed to her home and she will send the sample to  [facility] for analysis of the Common Hereditary Cancers Panel. Results should be available within approximately 2-3 weeks' time, at which point they will be disclosed by telephone to Ms. Lovett, as will any additional recommendations warranted by these results. Ms. Zeiders will receive a summary of her genetic counseling visit and a copy of her results once available. This information will also be available in Epic.   Based on Ms. Wieand family history, we recommended her paternal relatives have genetic counseling and testing. Ms. Bickhart will let us know if we can be of any assistance in coordinating genetic counseling and/or testing for these family members.  Lastly, we encouraged Ms. Guilbault to remain in contact with cancer genetics annually so that we can continuously update the family history and inform her of any changes in  cancer genetics and testing that may be of benefit for this family.   Ms. Pardi questions were answered to her satisfaction today. Our contact information was provided should additional questions or concerns arise. Thank you for the referral and allowing [place] to share in the care of your patient.   Faith Rogue, MS Genetic Counselor [email or web address] Phone: [phone]  The patient was seen for a total of 30 minutes in virtual genetic counseling. A [employer] intern, Kary Kos, was present and assisted with this case.  Drs. Magrinat, Lindi Adie and/or Burr Medico were available for discussion regarding this case.

## 2019-06-24 ENCOUNTER — Other Ambulatory Visit: Payer: Self-pay | Admitting: Surgery

## 2019-06-24 DIAGNOSIS — G8929 Other chronic pain: Secondary | ICD-10-CM

## 2019-06-24 DIAGNOSIS — R109 Unspecified abdominal pain: Secondary | ICD-10-CM

## 2019-06-26 DIAGNOSIS — Z803 Family history of malignant neoplasm of breast: Secondary | ICD-10-CM | POA: Diagnosis not present

## 2019-07-03 ENCOUNTER — Other Ambulatory Visit: Payer: Self-pay

## 2019-07-03 ENCOUNTER — Ambulatory Visit
Admission: RE | Admit: 2019-07-03 | Discharge: 2019-07-03 | Disposition: A | Discharge status: Home / Self Care | Payer: Medicare Other | Source: Ambulatory Visit | Attending: Surgery | Admitting: Surgery

## 2019-07-03 DIAGNOSIS — G8929 Other chronic pain: Secondary | ICD-10-CM

## 2019-07-03 DIAGNOSIS — K7460 Unspecified cirrhosis of liver: Secondary | ICD-10-CM | POA: Diagnosis not present

## 2019-07-03 IMAGING — CT CT ABDOMEN AND PELVIS WITH CONTRAST
2 of 5 series · 12 of 46 positions shown, 14 images · IV contrast (iopamidol)
Comparison: Limited right upper quadrant abdomen ultrasound dated
[DATE]. Abdomen CT dated [DATE] and abdomen and pelvis CT
report dated [DATE].

CLINICAL DATA: Intermittent abdominal pain and fullness in the
right mid abdomen. Previous splenectomy and hernia repair.

EXAM:
CT ABDOMEN AND PELVIS WITH CONTRAST
TECHNIQUE: Multidetector CT imaging of the abdomen and pelvis was performed
using the standard protocol following bolus administration of
intravenous contrast.
CONTRAST:  100mL [9M] IOPAMIDOL ([9M]) INJECTION 61%

[Series 2: abd pelvis 5.00 br40 s3 axial · axial · 0.60mm/px · z∈[+1054,+1474]mm · 9 of 96 slices shown, 11 images]
[im 6/96  soft-tissue]
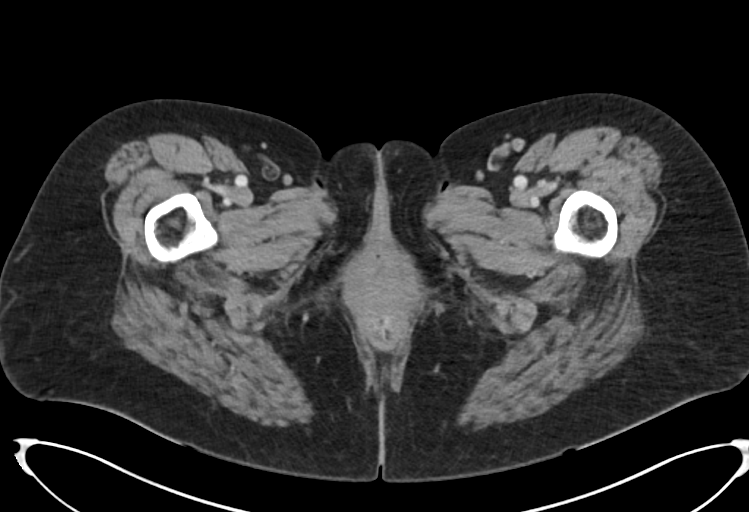
[im 6/96  bone]
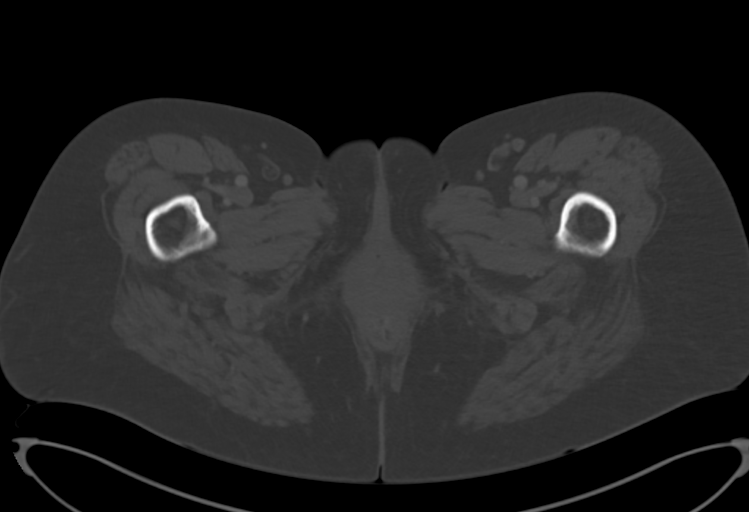
[im 16/96  soft-tissue]
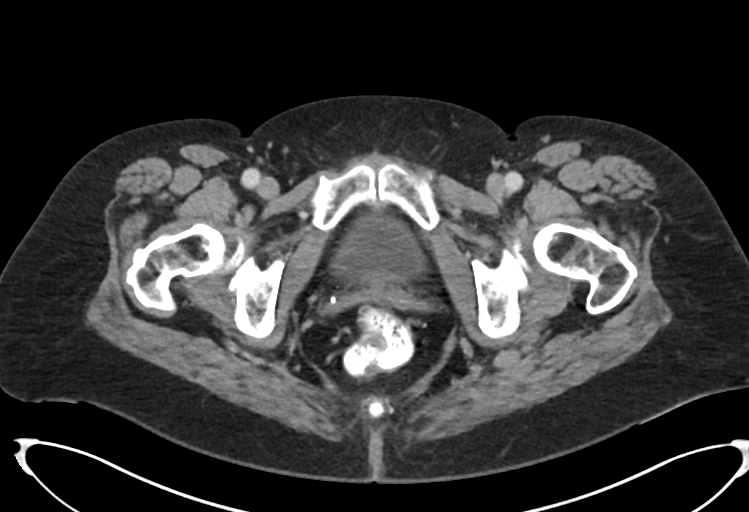
[im 27/96  soft-tissue]
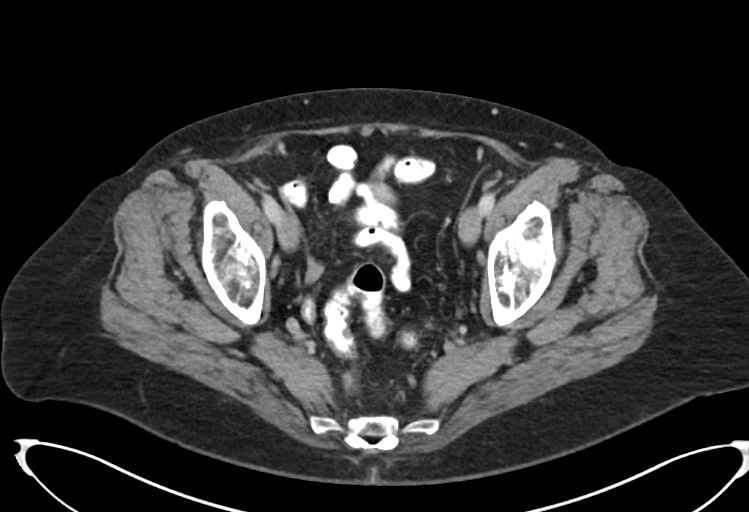
[im 37/96  soft-tissue]
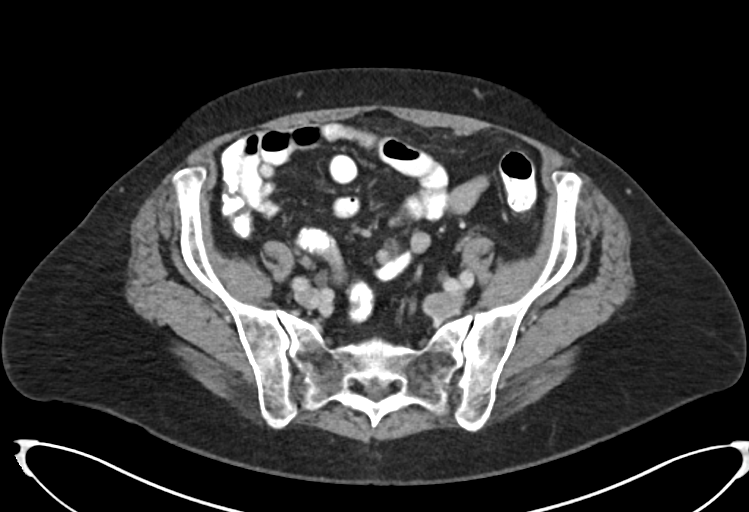
[im 48/96  soft-tissue]
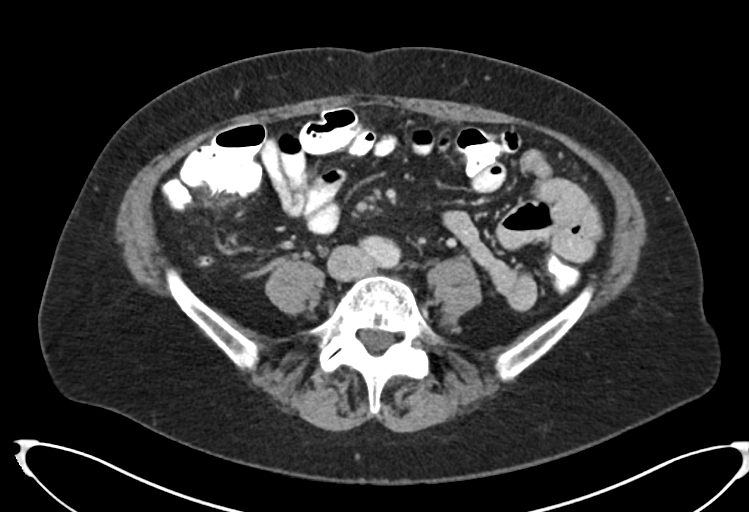
[im 59/96  soft-tissue]
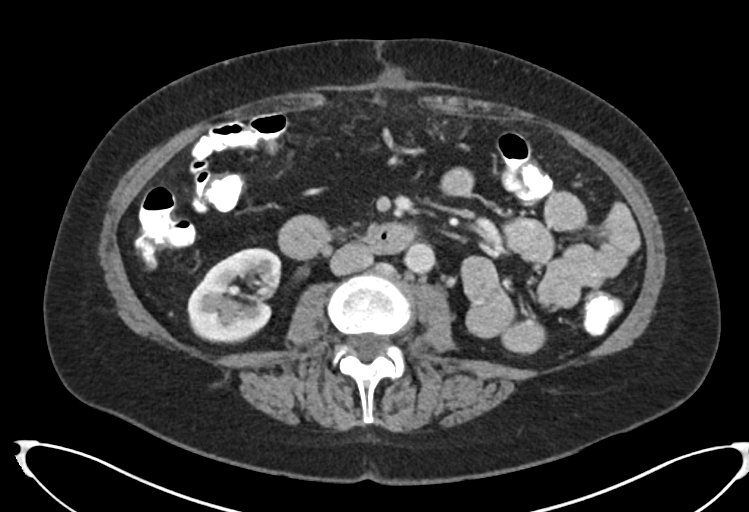
[im 69/96  soft-tissue]
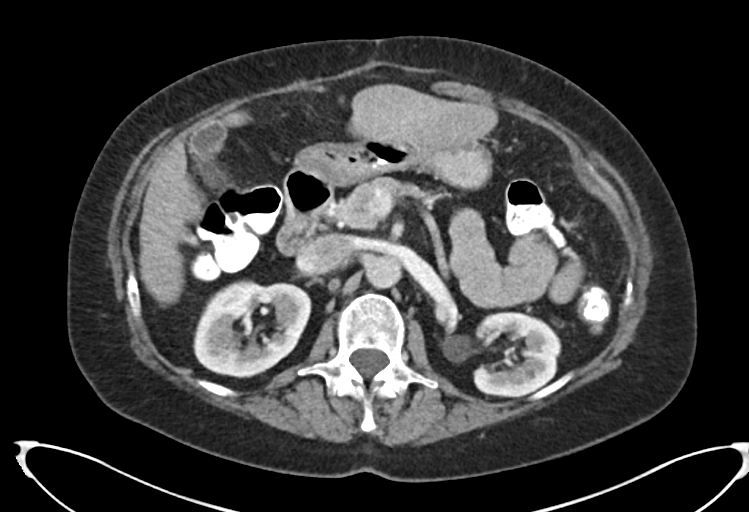
[im 80/96  soft-tissue]
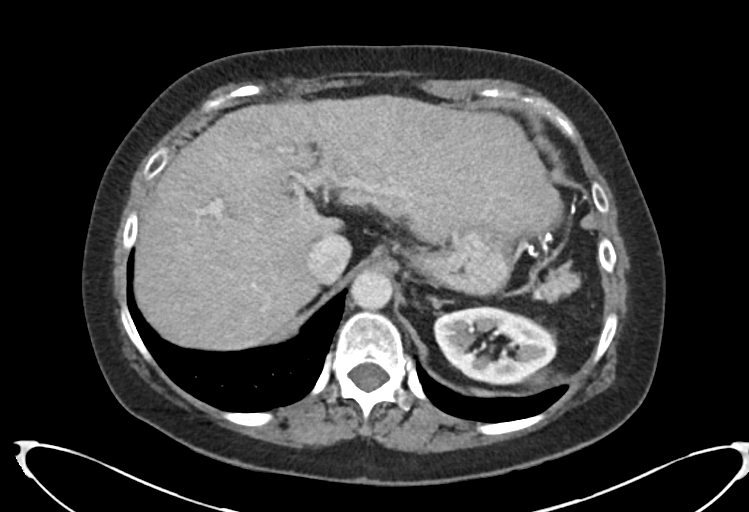
[im 90/96  soft-tissue]
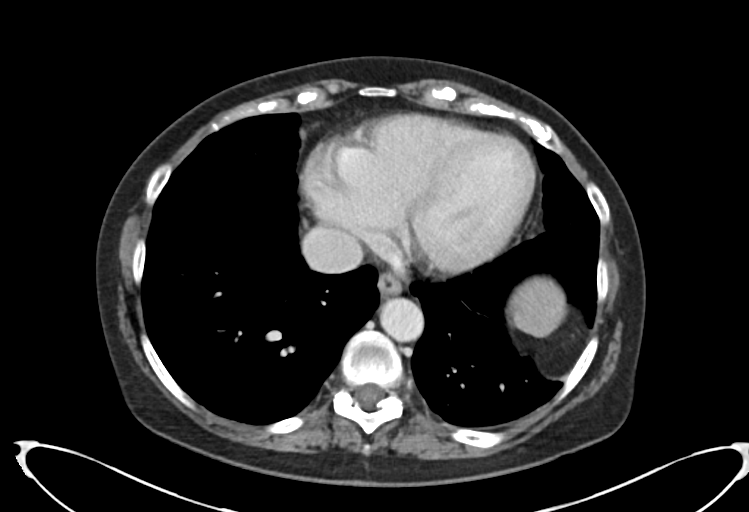
[im 90/96  bone]
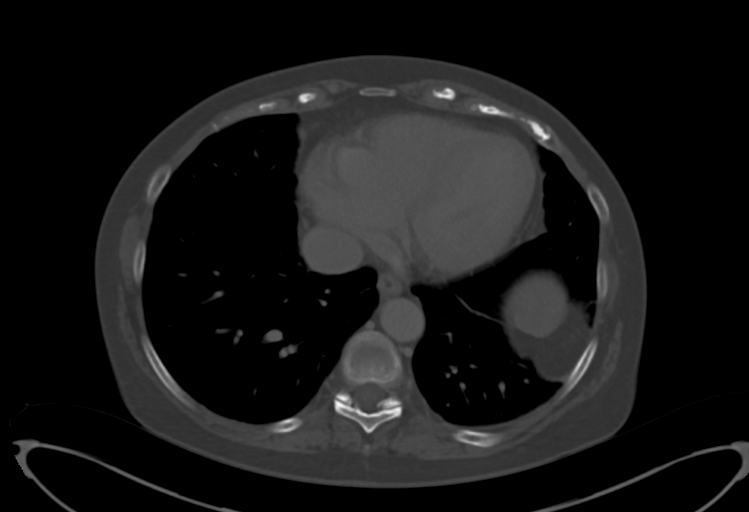

[Series 6: abd pelvis 2.00 br40 s3 cor · coronal · 0.87mm/px · 3 of 133 slices shown]
[im 45/133  soft-tissue]
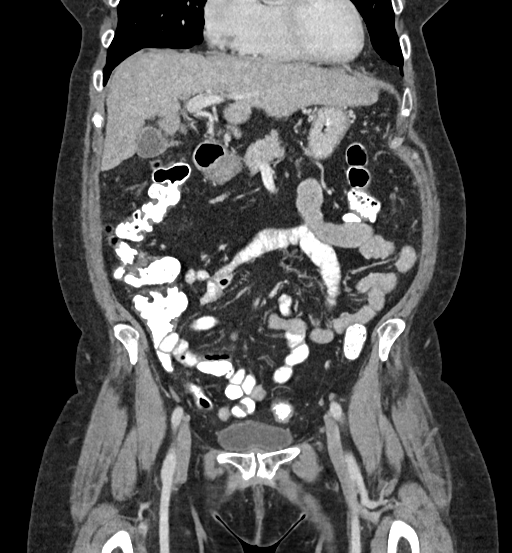
[im 59/133  soft-tissue]
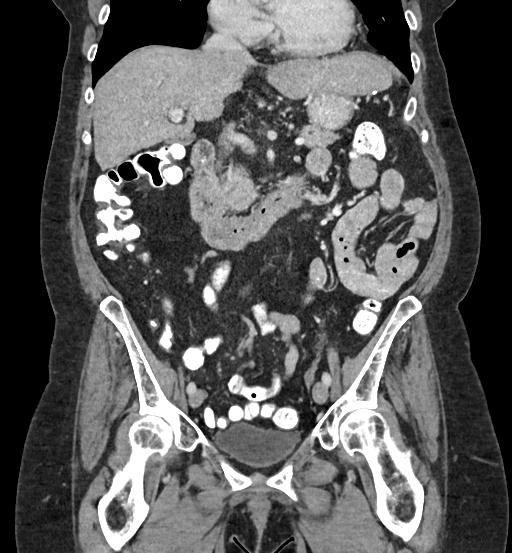
[im 74/133  soft-tissue]
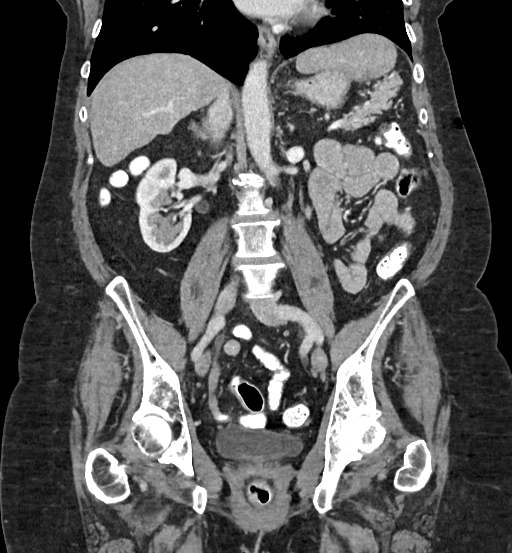

[12 of 46 positions shown; findings below may reference images not displayed]

FINDINGS: Lower chest: Unremarkable.

Hepatobiliary: Mildly nodular liver contours with a small right
lobe, prominent lateral segment left lobe and mildly prominent
caudate lobe.

Pancreas: Mild to moderate diffuse pancreatic atrophy.

Spleen: Surgically absent with a small splenule noted, unchanged.

Adrenals/Urinary Tract: Adrenal glands are unremarkable. Kidneys are
normal, without renal calculi, focal lesion, or hydronephrosis.
Bladder is unremarkable.

Stomach/Bowel: Stomach is within normal limits. Appendix appears
normal. No evidence of bowel wall thickening, distention, or
inflammatory changes.

Vascular/Lymphatic: No significant vascular findings are present. No
enlarged abdominal or pelvic lymph nodes.

Reproductive: Status post hysterectomy. No adnexal masses.

Other: Midline surgical scar. Anterior hernia repair mesh. No hernia
or mass seen at this time.

Musculoskeletal: Mild lumbar and lower thoracic spine degenerative
changes. Mild-to-moderate bilateral hip degenerative changes.
IMPRESSION: 1. No acute abnormality.
2. Stable changes of cirrhosis of the liver.

## 2019-07-03 MED ORDER — IOPAMIDOL (ISOVUE-300) INJECTION 61%
100.0000 mL | Freq: Once | INTRAVENOUS | Status: AC | PRN
  Administered 2019-07-03: 100 mL via INTRAVENOUS

## 2019-07-06 ENCOUNTER — Encounter: Payer: Self-pay | Admitting: Licensed Clinical Social Worker

## 2019-07-06 ENCOUNTER — Telehealth: Payer: Self-pay | Admitting: Licensed Clinical Social Worker

## 2019-07-06 ENCOUNTER — Ambulatory Visit: Payer: Self-pay | Admitting: Licensed Clinical Social Worker

## 2019-07-06 DIAGNOSIS — Z1379 Encounter for other screening for genetic and chromosomal anomalies: Secondary | ICD-10-CM

## 2019-07-06 DIAGNOSIS — Z803 Family history of malignant neoplasm of breast: Secondary | ICD-10-CM

## 2019-07-06 NOTE — Progress Notes (Signed)
HPI:  Donna Ramsey was previously seen in the [place] clinic due to a family history of breast cancer and concerns regarding a hereditary predisposition to cancer. Please refer to our prior cancer genetics clinic note for more information regarding our discussion, assessment and recommendations, at the time. Donna Ramsey recent genetic test results were disclosed to her, as were recommendations warranted by these results. These results and recommendations are discussed in more detail below.  CANCER HISTORY:  Oncology History   No history exists.    FAMILY HISTORY:  We obtained a detailed, 4-generation family history.  Significant diagnoses are listed below: Family History  Problem Relation Age of Onset  . Breast cancer Mother 17  . Breast cancer Paternal Aunt   . Breast cancer Paternal Grandmother        dx 56s    Donna Ramsey has one son, age 67, and 2 daughters, ages 25 and 60. No cancers. She has one sister, age 22, one brother, age 66, no cancer diagnoses.  Donna Ramsey mother was diagnosed with breast cancer at 28 and died at 39. The patient had one maternal uncle who died in 27 War II. No cancers in maternal cousins. Her maternal grandmother died in her 48s, maternal grandfather died in his late 79s of pneumonia.  Donna Ramsey father is deceased, no cancer history. She has limited information about this side of the family. The patient had 2 paternal uncles, 1 paternal aunt. Her aunt had breast cancer, she is unsure of age of diagnosis or death. Her paternal grandmother also had breast cancer, diagnosed in her 72s and died in her 66s. Her paternal grandfather also died at a relatively young age but she is unsure of cause.   Donna Ramsey is unaware of previous family history of genetic testing for hereditary cancer risks. Patient's maternal ancestors are of [place] descent, and paternal ancestors are of [place] descent. There is no reported Ashkenazi Jewish ancestry. There  is no known consanguinity.  GENETIC TEST RESULTS: Genetic testing reported out on 07/04/2019 through the [facility] Common Hereditary cancer panel found no pathogenic mutations.   The Common Hereditary Cancers Panel offered by [facility] includes sequencing and/or deletion duplication testing of the following 48 genes: APC, ATM, AXIN2, BARD1, BMPR1A, BRCA1, BRCA2, BRIP1, CDH1, CDKN2A (p14ARF), CDKN2A (p16INK4a), CKD4, CHEK2, CTNNA1, DICER1, EPCAM (Deletion/duplication testing only), GREM1 (promoter region deletion/duplication testing only), KIT, MEN1, MLH1, MSH2, MSH3, MSH6, MUTYH, NBN, NF1, NHTL1, PALB2, PDGFRA, PMS2, POLD1, POLE, PTEN, RAD50, RAD51C, RAD51D, RNF43, SDHB, SDHC, SDHD, SMAD4, SMARCA4. STK11, TP53, TSC1, TSC2, and VHL.  The following genes were evaluated for sequence changes only: SDHA and HOXB13 c.251G>A variant only.  The test report has been scanned into EPIC and is located under the Molecular Pathology section of the Results Review tab.  A portion of the result report is included below for reference.     We discussed with Donna Ramsey that because current genetic testing is not perfect, it is possible there may be a gene mutation in one of these genes that current testing cannot detect, but that chance is small.  We also discussed, that there could be another gene that has not yet been discovered, or that we have not yet tested, that is responsible for the cancer diagnoses in the family. It is also possible there is a hereditary cause for the cancer in the family that Donna Ramsey did not inherit and therefore was not identified in her testing.  Therefore, it is important  to remain in touch with cancer genetics in the future so that we can continue to offer Donna Ramsey the most up to date genetic testing.   Genetic testing did identify a variant of uncertain significance (VUS) was identified in the MUTYH gene called c.1256C>A.  At this time, it is unknown if this variant is associated with  increased cancer risk or if this is a normal finding, but most variants such as this get reclassified to being inconsequential. It should not be used to make medical management decisions. With time, we suspect the lab will determine the significance of this variant, if any. If we do learn more about it, we will try to contact Donna Ramsey to discuss it further. However, it is important to stay in touch with [place] periodically and keep the address and phone number up to date.  ADDITIONAL GENETIC TESTING: We discussed with Donna Ramsey that her genetic testing was fairly extensive.  If there are genes identified to increase cancer risk that can be analyzed in the future, we would be happy to discuss and coordinate this testing at that time.    CANCER SCREENING RECOMMENDATIONS: Donna Ramsey test result is considered negative (normal).  This means that we have not identified a hereditary cause for her family history of cancer at this time.   While reassuring, this does not definitively rule out a hereditary predisposition to cancer. It is still possible that there could be genetic mutations that are undetectable by current technology. There could be genetic mutations in genes that have not been tested or identified to increase cancer risk.  Therefore, it is recommended she continue to follow the cancer management and screening guidelines provided by her  primary healthcare provider.   An individual's cancer risk and medical management are not determined by genetic test results alone. Overall cancer risk assessment incorporates additional factors, including personal medical history, family history, and any available genetic information that may result in a personalized plan for cancer prevention and surveillance  Based on the patient's family history, a statistical model Air cabin crew) was used to estimate her risk of developing breast cancer. This estimates her lifetime risk of developing breast cancer to be  approximately 13.8%. This estimation is in the setting of negative genetic test results and may change based on results  The patient's lifetime breast cancer risk is a preliminary estimate based on available information using one of several models endorsed by the [place] (ACS). The ACS recommends consideration of breast MRI screening as an adjunct to mammography for patients at high risk (defined as 20% or greater lifetime risk).      RECOMMENDATIONS FOR FAMILY MEMBERS:  Relatives in this family might be at some increased risk of developing cancer, over the general population risk, simply due to the family history of cancer.  We recommended female relatives in this family have a yearly mammogram beginning at age 33, or 56 years younger than the earliest onset of cancer, an annual clinical breast exam, and perform monthly breast self-exams. Female relatives in this family should also have a gynecological exam as recommended by their primary provider. All family members should have a colonoscopy by age 2, or as directed by their physicians.   It is also possible there is a hereditary cause for the cancer in Ms. Guerin family that she did not inherit and therefore was not identified in her.  Based on Ms. Pujol family history, we recommended her paternal relatives have genetic counseling and testing.  Ms. Cubillos will let us know if we can be of any assistance in coordinating genetic counseling and/or testing for these family members.  FOLLOW-UP: Lastly, we discussed with Ms. Penning that cancer genetics is a rapidly advancing field and it is possible that new genetic tests will be appropriate for her and/or her family members in the future. We encouraged her to remain in contact with cancer genetics on an annual basis so we can update her personal and family histories and let her know of advances in cancer genetics that may benefit this family.   Our contact number was provided. Ms.  Rashid questions were answered to her satisfaction, and she knows she is welcome to call us at anytime with additional questions or concerns.   Faith Rogue, MS, [place] Genetic Counselor [email or web address] Phone: [phone]

## 2019-07-06 NOTE — Telephone Encounter (Signed)
Revealed negative genetic testing.  Revealed that a VUS in MUTYH was identified. This normal result is reassuring and indicates that it is unlikely Donna Ramsey cancer is due to a hereditary cause.  It is unlikely that there is an increased risk of another cancer due to a mutation in one of these genes.  However, genetic testing is not perfect, and cannot definitively rule out a hereditary cause.  It will be important for her to keep in contact with genetics to learn if any additional testing may be needed in the future.

## 2019-08-14 ENCOUNTER — Ambulatory Visit (INDEPENDENT_AMBULATORY_CARE_PROVIDER_SITE_OTHER): Payer: Medicare Other

## 2019-08-14 ENCOUNTER — Other Ambulatory Visit: Payer: Self-pay

## 2019-08-14 ENCOUNTER — Encounter: Payer: Self-pay | Admitting: Sports Medicine

## 2019-08-14 ENCOUNTER — Ambulatory Visit (INDEPENDENT_AMBULATORY_CARE_PROVIDER_SITE_OTHER): Payer: Medicare Other | Admitting: Sports Medicine

## 2019-08-14 DIAGNOSIS — M1612 Unilateral primary osteoarthritis, left hip: Secondary | ICD-10-CM

## 2019-08-14 DIAGNOSIS — M7552 Bursitis of left shoulder: Secondary | ICD-10-CM | POA: Diagnosis not present

## 2019-08-14 DIAGNOSIS — M19012 Primary osteoarthritis, left shoulder: Secondary | ICD-10-CM | POA: Diagnosis not present

## 2019-08-14 IMAGING — DX LEFT SHOULDER - 2+ VIEW
3 series · 3 of 3 positions shown · non-contrast
Comparison: None.

CLINICAL DATA: Pain involving the proximal aspect of the left
humerus for the past 2 weeks.

EXAM:
LEFT SHOULDER - 2+ VIEW

[shoulder grashey]
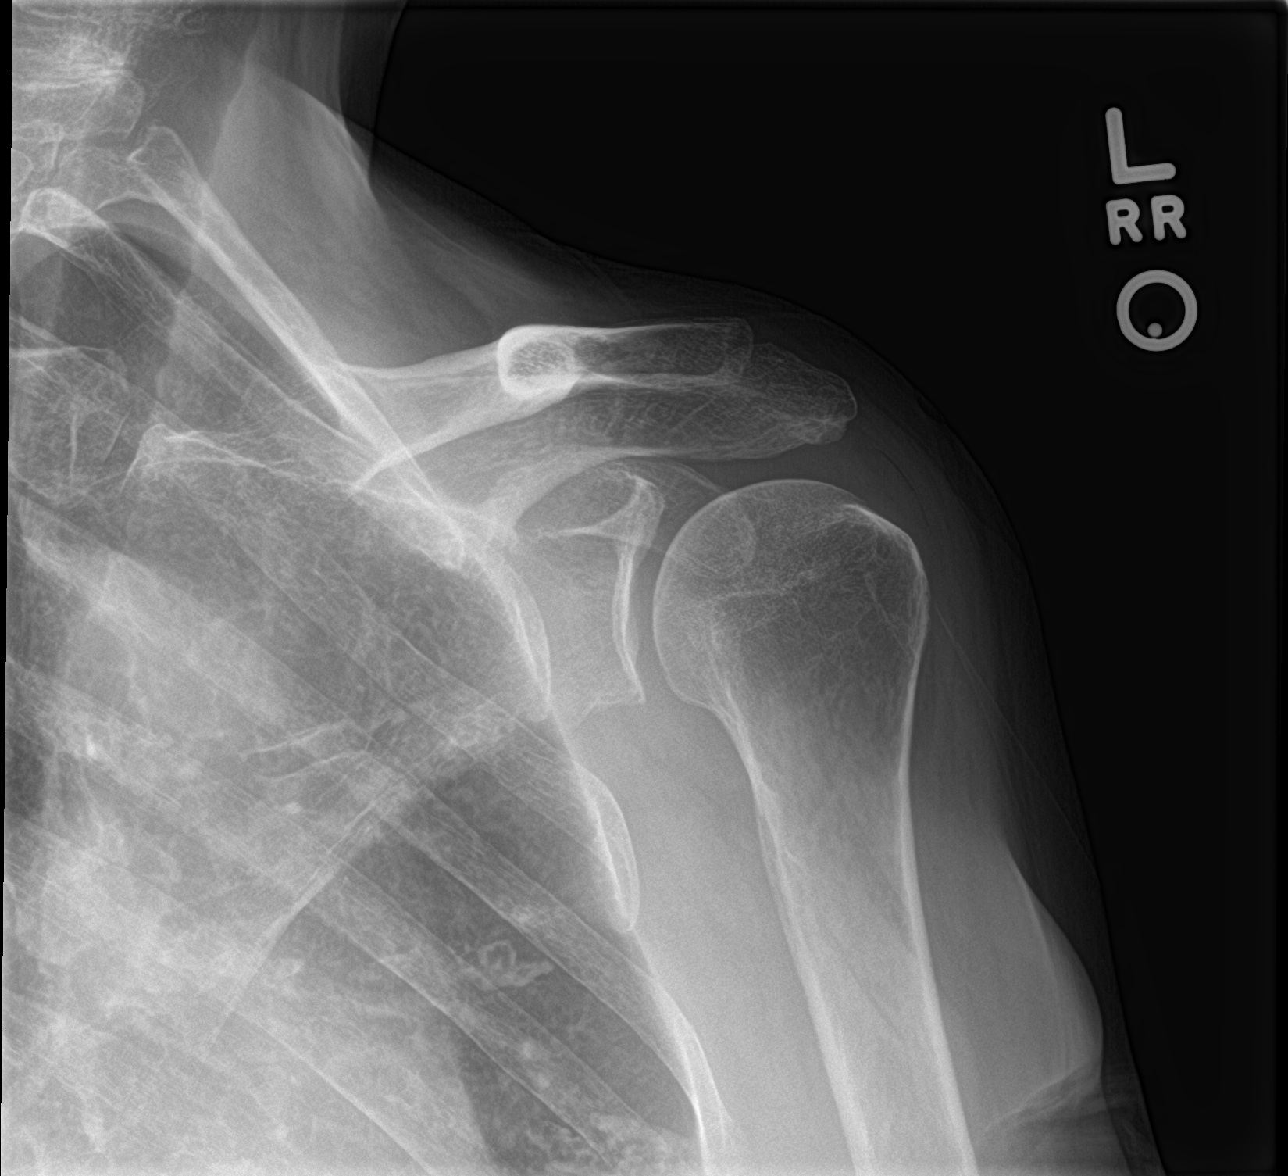

[shoulder y view]
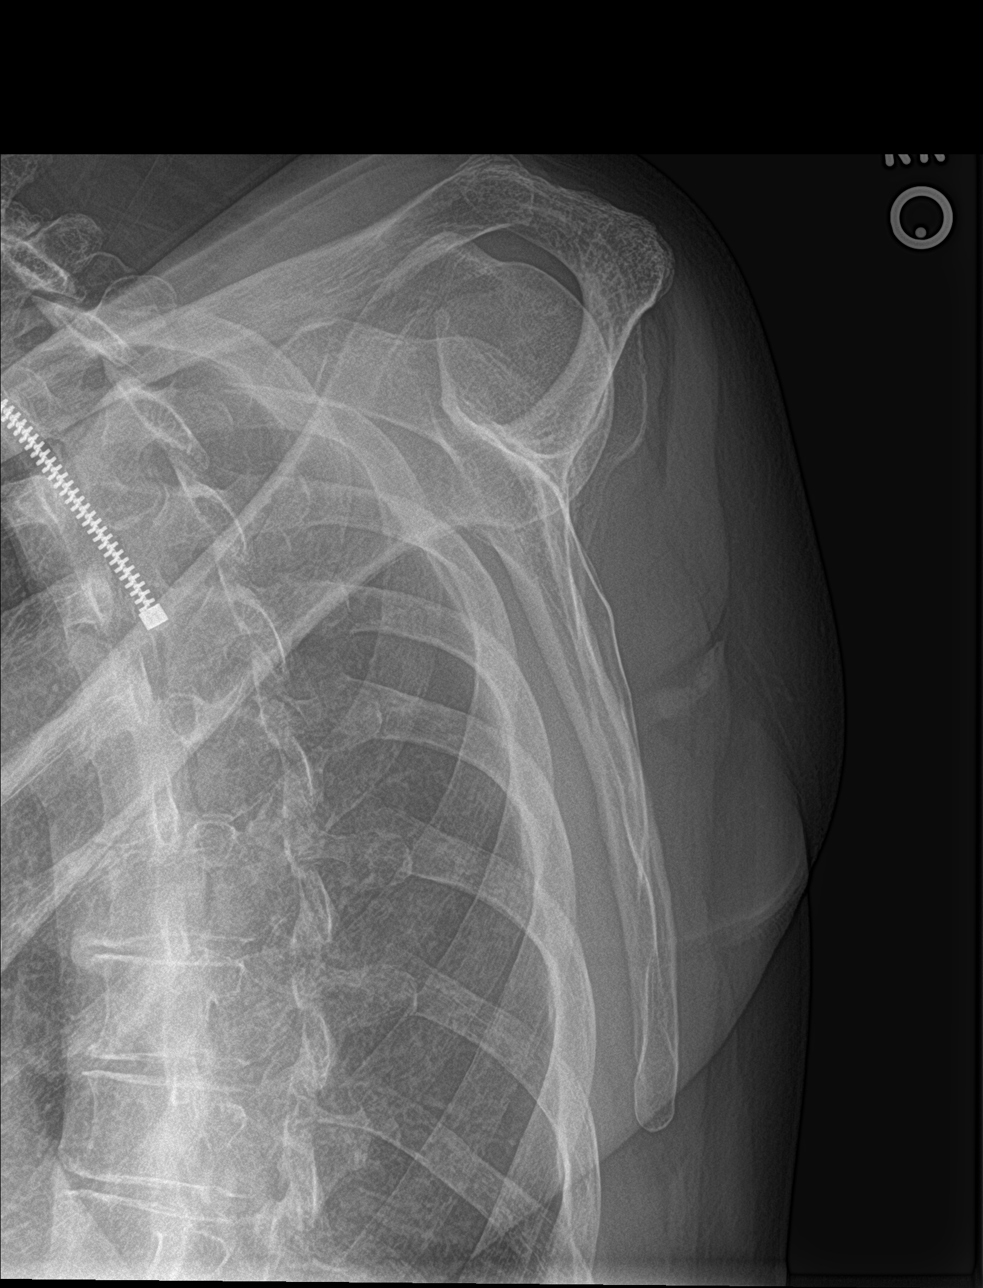

[shoulder axillary]
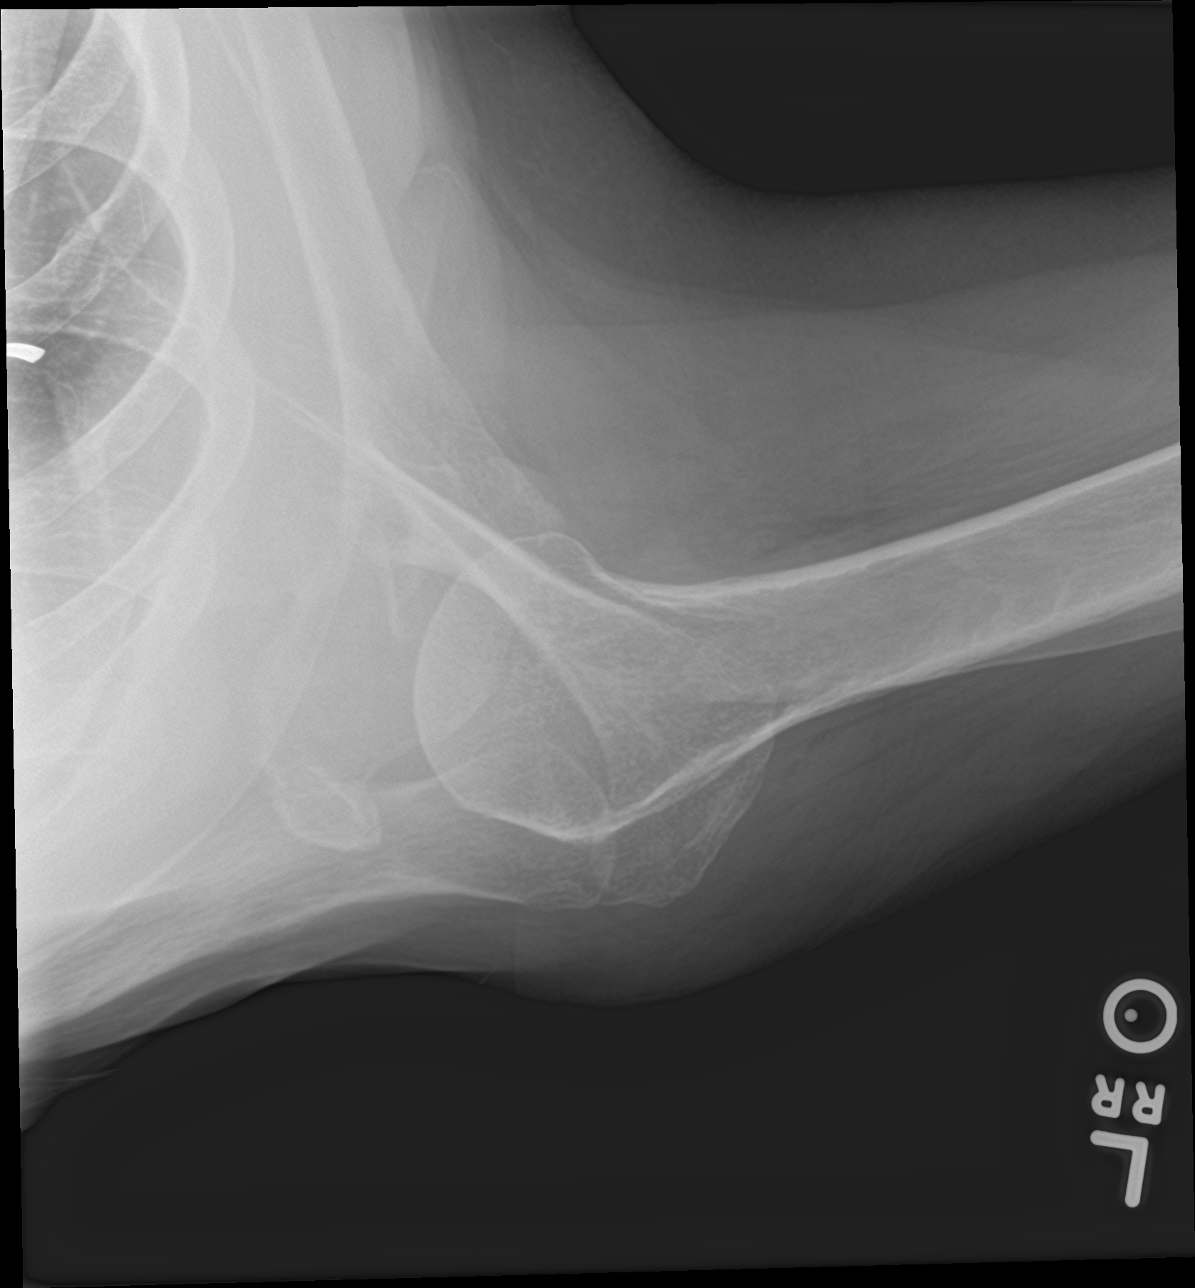

[3 of 3 positions shown; findings below may reference images not displayed]

FINDINGS: No fracture or dislocation. Mild degenerative change of the left
glenohumeral joint with joint space loss, articular surface
irregularity and subchondral sclerosis. Limited visualization of the
acromioclavicular joint appears normal. No evidence of calcific
tendinitis. Limited visualization of the adjacent thorax is normal.
Regional soft tissues appear normal.
IMPRESSION: Mild degenerative change of the left glenohumeral joint.

## 2019-08-14 NOTE — Assessment & Plan Note (Signed)
Recurrence of left shoulder pain, subacromial bursitis. Repeat injection because she is having nocturnal symptoms. At this point we are going to proceed with x-ray and MRI.

## 2019-08-14 NOTE — Assessment & Plan Note (Signed)
Hip joint injection in May provided good relief until recently, she will restart her hip exercises, we will probably do an MR arthrogram if she has a recurrence of discomfort.

## 2019-08-14 NOTE — Progress Notes (Signed)
Subjective:    CC: Left arm pain  HPI: This is a pleasant 68 year old female, she has had pain in her left arm for some time now, we resolve this with a subacromial injection, now she is having recurrence of discomfort.  Localized over the deltoid, worse with overhead activities.  Left hip osteoarthritis: Also with a suspected degenerative labral tear, did well after an injection approximately 3 months ago, now having recurrence of discomfort  I reviewed the past medical history, family history, social history, surgical history, and allergies today and no changes were needed.  Please see the problem list section below in epic for further details.  Past Medical History: Past Medical History:  Diagnosis Date  . Family history of breast cancer    Past Surgical History: Past Surgical History:  Procedure Laterality Date  . ABDOMINAL HYSTERECTOMY    . FOOT SURGERY    . SPLENECTOMY, TOTAL     Social History: Social History   Socioeconomic History  . Marital status: Married    Spouse name: Not on file  . Number of children: Not on file  . Years of education: Not on file  . Highest education level: Not on file  Occupational History  . Not on file  Social Needs  . Financial resource strain: Not on file  . Food insecurity    Worry: Not on file    Inability: Not on file  . Transportation needs    Medical: Not on file    Non-medical: Not on file  Tobacco Use  . Smoking status: Never Smoker  . Smokeless tobacco: Never Used  Substance and Sexual Activity  . Alcohol use: Not on file  . Drug use: Not on file  . Sexual activity: Not on file  Lifestyle  . Physical activity    Days per week: Not on file    Minutes per session: Not on file  . Stress: Not on file  Relationships  . Social Herbalist on phone: Not on file    Gets together: Not on file    Attends religious service: Not on file    Active member of club or organization: Not on file    Attends meetings of  clubs or organizations: Not on file    Relationship status: Not on file  Other Topics Concern  . Not on file  Social History Narrative  . Not on file   Family History: Family History  Problem Relation Age of Onset  . Breast cancer Mother 38  . Breast cancer Paternal Aunt   . Breast cancer Paternal Grandmother        dx 56s   Allergies: Allergies  Allergen Reactions  . Cephalosporins Hives  . Codeine Hives and Rash  . Penicillins Hives, Other (See Comments) and Rash    Hives   . Levofloxacin Rash and Other (See Comments)    tendonitis tendonitis unknown unknown   . Sulfa Antibiotics Rash   Medications: See med rec.  Review of Systems: No fevers, chills, night sweats, weight loss, chest pain, or shortness of breath.   Objective:    General: Well Developed, well nourished, and in no acute distress.  Neuro: Alert and oriented x3, extra-ocular muscles intact, sensation grossly intact.  HEENT: Normocephalic, atraumatic, pupils equal round reactive to light, neck supple, no masses, no lymphadenopathy, thyroid nonpalpable.  Skin: Warm and dry, no rashes. Cardiac: Regular rate and rhythm, no murmurs rubs or gallops, no lower extremity edema.  Respiratory: Clear to  auscultation bilaterally. Not using accessory muscles, speaking in full sentences. Left shoulder: Inspection reveals no abnormalities, atrophy or asymmetry. Palpation is normal with no tenderness over AC joint or bicipital groove. ROM is full in all planes. Rotator cuff strength normal throughout. Positive Neer and Hawkin's tests, empty can. Speeds and Yergason's tests normal. No labral pathology noted with negative Obrien's, negative crank, negative clunk, and good stability. Normal scapular function observed. No painful arc and no drop arm sign. No apprehension sign  Procedure: Real-time Ultrasound Guided injection of the left subacromial bursa Device: GE Logiq E  Verbal informed consent obtained.   Time-out conducted.  Noted no overlying erythema, induration, or other signs of local infection.  Skin prepped in a sterile fashion.  Local anesthesia: Topical Ethyl chloride.  With sterile technique and under real time ultrasound guidance:  1 cc Kenalog 40, 1 cc lidocaine, 1 cc bupivacaine injected easily Completed without difficulty  Pain immediately resolved suggesting accurate placement of the medication.  Advised to call if fevers/chills, erythema, induration, drainage, or persistent bleeding.  Images permanently stored and available for review in the ultrasound unit.  Impression: Technically successful ultrasound guided injection.  Impression and Recommendations:    Acute shoulder bursitis, left Recurrence of left shoulder pain, subacromial bursitis. Repeat injection because she is having nocturnal symptoms. At this point we are going to proceed with x-ray and MRI.  Primary osteoarthritis of left hip Hip joint injection in May provided good relief until recently, she will restart her hip exercises, we will probably do an MR arthrogram if she has a recurrence of discomfort.   ___________________________________________ Gwen Her. Dianah Field, M.D., ABFM., CAQSM. Primary Care and Sports Medicine [facility]  Adjunct Professor of [employer]

## 2019-08-23 ENCOUNTER — Ambulatory Visit (INDEPENDENT_AMBULATORY_CARE_PROVIDER_SITE_OTHER): Payer: Medicare Other

## 2019-08-23 ENCOUNTER — Other Ambulatory Visit: Payer: Self-pay

## 2019-08-23 DIAGNOSIS — M7552 Bursitis of left shoulder: Secondary | ICD-10-CM | POA: Diagnosis not present

## 2019-08-23 DIAGNOSIS — M25512 Pain in left shoulder: Secondary | ICD-10-CM | POA: Diagnosis not present

## 2019-08-23 IMAGING — MR MR SHOULDER*L* W/O CM
5 series · 40 of 40 positions shown · non-contrast
Comparison: None.

CLINICAL DATA: Anterior left shoulder pain for 3 months. No known
injury.

EXAM:
MRI OF THE LEFT SHOULDER WITHOUT CONTRAST
TECHNIQUE: Multiplanar, multisequence MR imaging of the shoulder was performed.
No intravenous contrast was administered.

[Series 6: T1 · oblique · 4.0mm · 0.59mm/px · 8 of 23 slices shown]
[im 1/23]
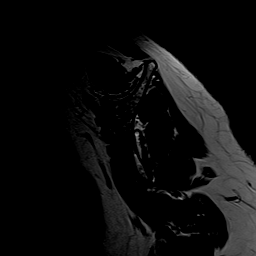
[im 4/23]
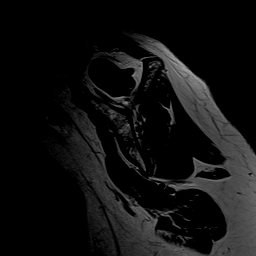
[im 7/23]
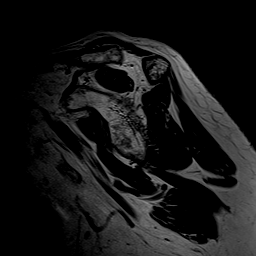
[im 10/23]
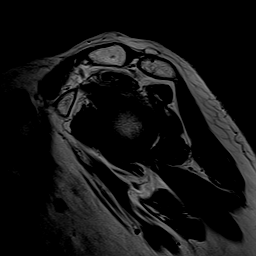
[im 13/23]
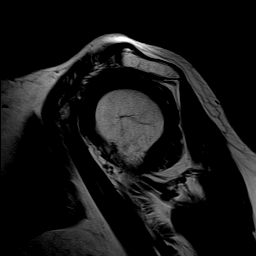
[im 16/23]
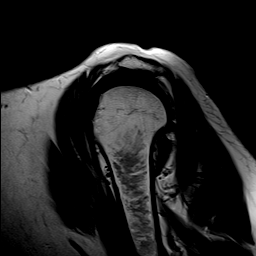
[im 19/23]
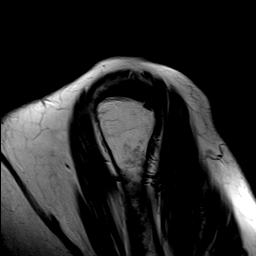
[im 23/23]
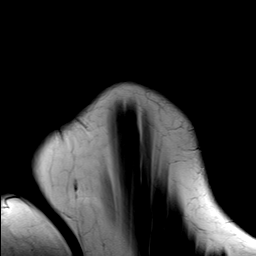

[Series 7: T2 fat-sat · oblique · 4.0mm · 0.59mm/px · 9 of 23 slices shown (1 of 2)]
[im 1/23]
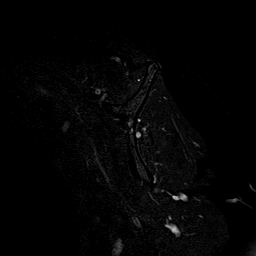
[im 3/23]
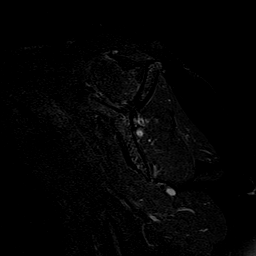
[im 6/23]
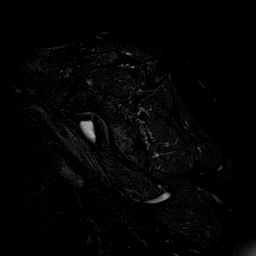
[im 9/23]
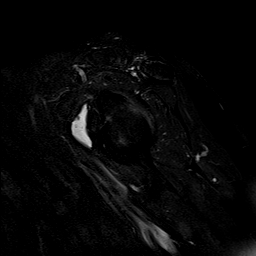
[im 12/23]
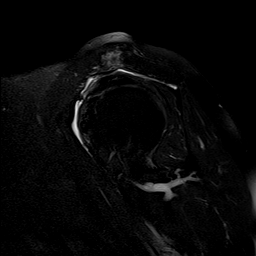
[im 14/23]
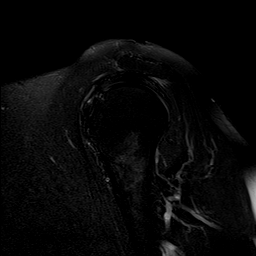
[im 17/23]
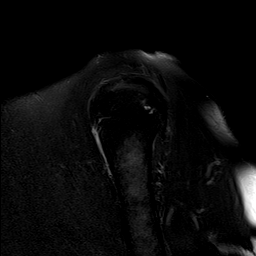
[im 20/23]
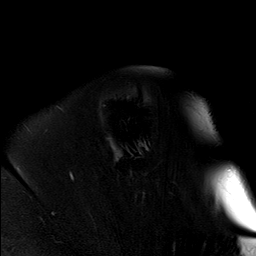
[im 23/23]
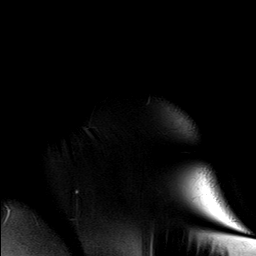

[Series 8: PD fat-sat · axial · 4.0mm · 0.59mm/px · z∈[-36,+60]mm · 9 of 23 slices shown (1 of 2)]
[im 1/23]
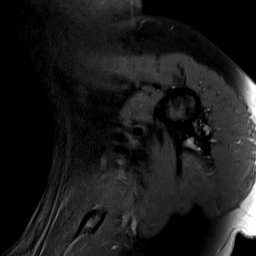
[im 3/23]
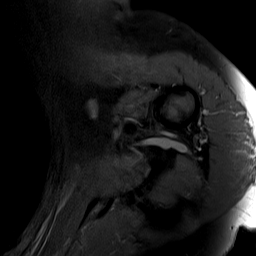
[im 6/23]
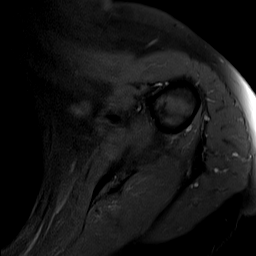
[im 9/23]
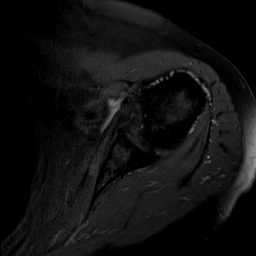
[im 12/23]
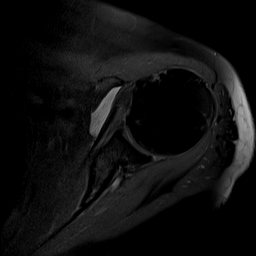
[im 14/23]
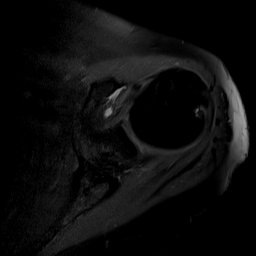
[im 17/23]
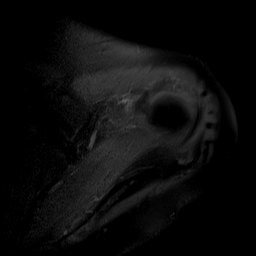
[im 20/23]
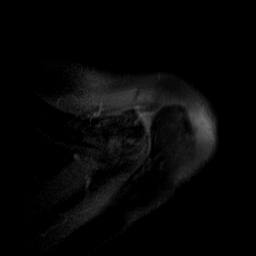
[im 23/23]
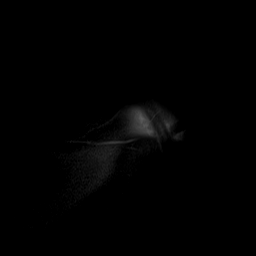

[Series 9: PD fat-sat · oblique · 4.0mm · 0.59mm/px · 7 of 19 slices shown (2 of 2)]
[im 1/19]
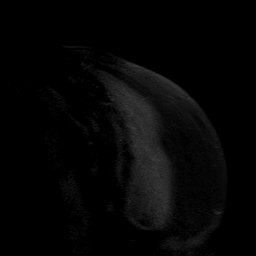
[im 4/19]
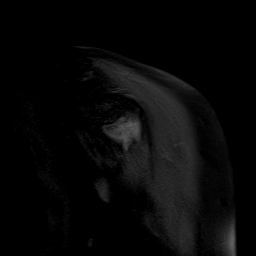
[im 7/19]
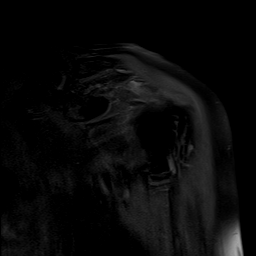
[im 10/19]
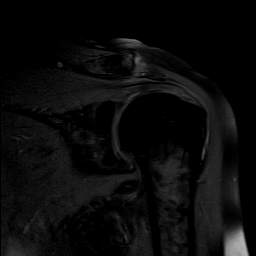
[im 13/19]
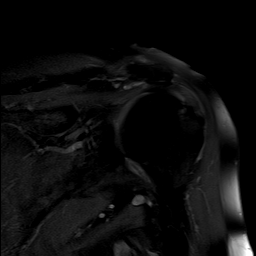
[im 16/19]
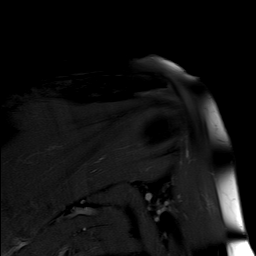
[im 19/19]
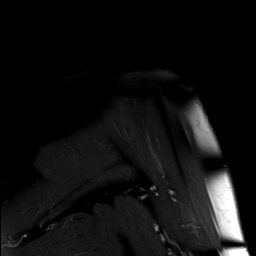

[Series 10: T2 fat-sat · oblique · 4.0mm · 0.59mm/px · 7 of 19 slices shown (2 of 2)]
[im 1/19]
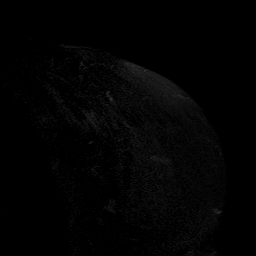
[im 4/19]
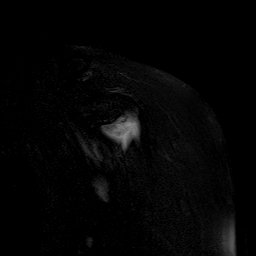
[im 7/19]
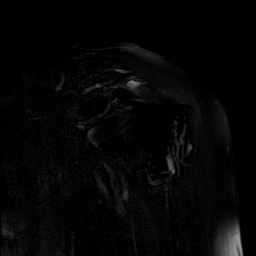
[im 10/19]
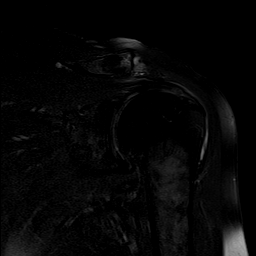
[im 13/19]
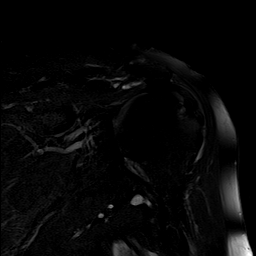
[im 16/19]
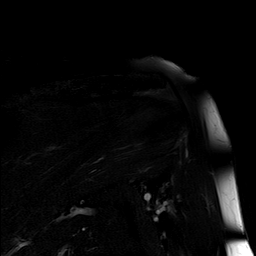
[im 19/19]
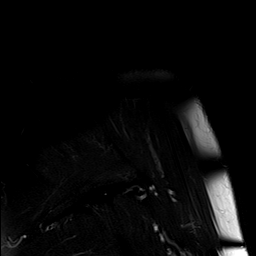

[40 of 40 positions shown; findings below may reference images not displayed]

FINDINGS: Rotator cuff: Heterogeneously increased T2 signal in the rotator
cuff tendons is worst in the subscapularis. The distal subscapularis
is markedly is thickened and irregular with interstitial tearing
present. No retracted tendon is identified.

Muscles:  Preserved without atrophy or focal lesion.

Biceps long head: The tendon is medially dislocated out of the
bicipital groove into the substance of the subscapularis tendon as
seen on image 11 of series 8 consistent with a type 3 biceps pulley
injury.

Acromioclavicular Joint: Moderate osteoarthritis. Type 1 acromion.
Small volume of subacromial/subdeltoid fluid.

Glenohumeral Joint: Mild degenerative change noted.

Labrum:  Intact.

Bones:  No fracture or worrisome lesion.

Other: None.
IMPRESSION: Rotator cuff tendinopathy is worst in the subscapularis where there
is an interstitial tear of the distal tendon. The biceps is medially
dislocated out of the bicipital groove and into the tear consistent
with a type 3 biceps pulley injury.

Moderate acromioclavicular osteoarthritis.

Small volume of subacromial/subdeltoid fluid compatible with
bursitis.

## 2019-08-25 ENCOUNTER — Encounter: Payer: Self-pay | Admitting: Sports Medicine

## 2019-08-25 DIAGNOSIS — Z23 Encounter for immunization: Secondary | ICD-10-CM | POA: Diagnosis not present

## 2019-08-25 DIAGNOSIS — M7552 Bursitis of left shoulder: Secondary | ICD-10-CM

## 2019-08-26 DIAGNOSIS — K219 Gastro-esophageal reflux disease without esophagitis: Secondary | ICD-10-CM | POA: Diagnosis not present

## 2019-08-26 DIAGNOSIS — K7469 Other cirrhosis of liver: Secondary | ICD-10-CM | POA: Diagnosis not present

## 2019-08-28 ENCOUNTER — Other Ambulatory Visit: Payer: Self-pay

## 2019-08-28 ENCOUNTER — Ambulatory Visit (INDEPENDENT_AMBULATORY_CARE_PROVIDER_SITE_OTHER): Payer: Medicare Other | Admitting: Physical Therapy

## 2019-08-28 ENCOUNTER — Encounter: Payer: Self-pay | Admitting: Physical Therapy

## 2019-08-28 DIAGNOSIS — R293 Abnormal posture: Secondary | ICD-10-CM | POA: Diagnosis not present

## 2019-08-28 DIAGNOSIS — M25512 Pain in left shoulder: Secondary | ICD-10-CM

## 2019-08-28 DIAGNOSIS — G8929 Other chronic pain: Secondary | ICD-10-CM

## 2019-08-28 DIAGNOSIS — M6281 Muscle weakness (generalized): Secondary | ICD-10-CM | POA: Diagnosis not present

## 2019-08-28 NOTE — Therapy (Signed)
[place], [place], [address] Phone: [phone]   Fax:  [phone]  Physical Therapy Evaluation  Patient Details  Name: Donna Ramsey MRN: [id number] Date of Birth: [birth year] Referring Provider (PT): Silverio Decamp, MD   Encounter Date: 08/28/2019  PT End of Session - 08/28/19 1159    Visit Number  1    Number of Visits  12    Date for PT Re-Evaluation  10/09/19    PT Start Time  1013    PT Stop Time  1056    PT Time Calculation (min)  43 min    Activity Tolerance  Patient tolerated treatment well    Behavior During Therapy  [facility] for tasks assessed/performed       Past Medical History:  Diagnosis Date  . Family history of breast cancer     Past Surgical History:  Procedure Laterality Date  . ABDOMINAL HYSTERECTOMY    . FOOT SURGERY    . SPLENECTOMY, TOTAL      There were no vitals filed for this visit.   Subjective Assessment - 08/28/19 1016    Subjective  Pt is a 68 y/o female who presents to OPPT for Lt shoulder pain.  Pt reports a fall on 12/16/18 falling onto Lt side (ankle fx), and continues to have chronic pain in shoulder.  Pt has had 2 cortisone injections which have been helpful (most recent 1-2 weeks ago).    Limitations  Lifting    Diagnostic tests  MRI: Rotator cuff tendinopathy, biceps tendinitis and displacement    Patient Stated Goals  improve shoulder pain, get shoulder to heal    Currently in Pain?  Yes    Pain Score  0-No pain   up to 2/10; prior to injection pain up to 5/10   Pain Location  Shoulder    Pain Orientation  Left;Lateral    Pain Descriptors / Indicators  Sharp    Pain Type  Chronic pain;Acute pain    Pain Onset  More than a month ago    Pain Frequency  Intermittent    Aggravating Factors   movement -abduction specifically with internal rotation    Pain Relieving Factors  injections, heat, repositioning         [facility] PT Assessment -  08/28/19 1020      Assessment   Medical Diagnosis  M75.52 (ICD-10-CM) - Acute shoulder bursitis, left    Referring Provider (PT)  Silverio Decamp, MD    Onset Date/Surgical Date  --   Jan 2020   Hand Dominance  Right    Next MD Visit  2-3 weeks    Prior Therapy  for neck pain/vocal cord infections      Precautions   Precautions  None      Restrictions   Weight Bearing Restrictions  No      Balance Screen   Has the patient fallen in the past 6 months  No    Has the patient had a decrease in activity level because of a fear of falling?   No    Is the patient reluctant to leave their home because of a fear of falling?   No      Home Environment   Living Environment  Private residence    Living Arrangements  Spouse/significant other    Additional Comments  I with ADLs      Prior Function   Level of Independence  Independent  Vocation  Retired    Biomedical scientist  retired Architect; travel; gardening, work outside; no regular exercise      Cognition   Overall Cognitive Status  Within Functional Limits for tasks assessed      Observation/Other Assessments   Focus on Therapeutic Outcomes (FOTO)   65 (35% limited; predicted 31% limited)      Posture/Postural Control   Posture/Postural Control  Postural limitations    Postural Limitations  Rounded Shoulders;Forward head;Increased thoracic kyphosis      ROM / Strength   AROM / PROM / Strength  AROM;Strength      AROM   Overall AROM Comments  bil shoulder WNL; mild discomfort with end ranges on Lt shoulder      Strength   Strength Assessment Site  Shoulder    Right/Left Shoulder  Left    Left Shoulder Flexion  3+/5    Left Shoulder ABduction  3+/5    Left Shoulder Internal Rotation  4/5    Left Shoulder External Rotation  3+/5      Palpation   Palpation comment  trigger points and tenderness noted in infraspinatus, teres minor and distal border of subscapularis                 Objective measurements completed on examination: See above findings.      [place] Adult PT Treatment/Exercise - 08/28/19 1020      Exercises   Exercises  Shoulder      Shoulder Exercises: Seated   Retraction  Both;5 reps    Row Limitations  instructed in home with red theraband    External Rotation  Both;5 reps      Shoulder Exercises: Stretch   Other Shoulder Stretches  mid level doorway stretch x 30 sec      Modalities   Modalities  Iontophoresis      Iontophoresis   Type of Iontophoresis  Dexamethasone    Location  Lt infraspinatus/teres minor    Dose  1.0 cc    Time  6 hour patch             PT Education - 08/28/19 1158    Education Details  HEP, ionto, DN    Person(s) Educated  Patient    Methods  Explanation;Demonstration;Handout    Comprehension  Verbalized understanding;Returned demonstration;Need further instruction          PT Long Term Goals - 08/28/19 1203      PT LONG TERM GOAL #1   Title  independent with HEP    Status  New    Target Date  10/09/19      PT LONG TERM GOAL #2   Title  FOTO score improved to </= 31% limited for improved function    Status  New    Target Date  10/09/19      PT LONG TERM GOAL #3   Title  demonstrate at least 4/5 Lt shoulder strength for improved function    Status  New    Target Date  10/09/19      PT LONG TERM GOAL #4   Title  report pain < 3/10 with lifting activities for improved function    Status  New    Target Date  10/09/19             Plan - 08/28/19 1159    Clinical Impression Statement  Pt is a 68 y/o female who presents to OPPT for Lt shoulder pain  with 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  Pt demonstrates decreased strength and postural abnormalities as well as active trigger points affecting function.  Pt will benefit from PT to address deficits listed.    Personal Factors and Comorbidities  Time since onset of injury/illness/exacerbation;Comorbidity 2     Comorbidities  OA, multiple tendinopathies    Examination-Activity Limitations  Lift;Reach Overhead    Examination-Participation Restrictions  Yard Work    Stability/Clinical Decision Making  Stable/Uncomplicated    Clinical Decision Making  Low    Rehab Potential  Good    PT Frequency  2x / week    PT Duration  6 weeks    PT Treatment/Interventions  ADLs/Self Care Home Management;Cryotherapy;Electrical Stimulation;Ultrasound;Moist Heat;Iontophoresis 4mg /ml Dexamethasone;Functional mobility training;Therapeutic activities;Therapeutic exercise;Patient/family education;Manual techniques;Passive range of motion;Vasopneumatic Device;Taping;Dry needling    PT Next Visit Plan  review HEP and progress posture exercises; manual/modalities/DN; assess response to ionto    PT Home Exercise Plan  Access Code: [email or web address]    Consulted and Agree with Plan of Care  Patient       Patient will benefit from skilled therapeutic intervention in order to improve the following deficits and impairments:  Increased fascial restricitons, Increased muscle spasms, Pain, Impaired UE functional use, Decreased strength, Postural dysfunction  Visit Diagnosis: Chronic left shoulder pain - Plan: PT plan of care cert/re-cert  Abnormal posture - Plan: PT plan of care cert/re-cert  Muscle weakness (generalized) - Plan: PT plan of care cert/re-cert     Problem List Patient Active Problem List   Diagnosis Date Noted  . Genetic testing 07/06/2019  . Family history of breast cancer   . Primary osteoarthritis of left hip 05/13/2019  . 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  . Osteopenia 05/13/2019  . Peroneal tendinitis, left 02/16/2019  . Acute shoulder bursitis, left 02/16/2019  . 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 Fracture of fibula, left, closed 12/24/2018       Laureen Abrahams, PT, DPT 08/28/19 12:06 PM      [place], [place], [address] Phone: [phone]   Fax:  [phone]  Name: Donna Ramsey MRN: [id number] Date of Birth: 1950-12-18

## 2019-08-28 NOTE — Patient Instructions (Addendum)
IONTOPHORESIS PATIENT PRECAUTIONS & CONTRAINDICATIONS:  . Redness under one or both electrodes can occur.  This characterized by a uniform redness that usually disappears within 12 hours of treatment. . Small pinhead size blisters may result in response to the drug.  Contact your physician if the problem persists more than 24 hours. . On rare occasions, iontophoresis therapy can result in temporary skin reactions such as rash, inflammation, irritation or burns.  The skin reactions may be the result of individual sensitivity to the ionic solution used, the condition of the skin at the start of treatment, reaction to the materials in the electrodes, allergies or sensitivity to dexamethasone, or a poor connection between the patch and your skin.  Discontinue using iontophoresis if you have any of these reactions and report to your therapist. . Remove the Patch or electrodes if you have any undue sensation of pain or burning during the treatment and report discomfort to your therapist. . Tell your Therapist if you have had known adverse reactions to the application of electrical current. . If using the Patch, the LED light will turn off when treatment is complete and the patch can be removed.  Approximate treatment time is 1-3 hours.  Remove the patch when light goes off or after 6 hours. . The Patch can be worn during normal activity, however excessive motion where the electrodes have been placed can cause poor contact between the skin and the electrode or uneven electrical current resulting in greater risk of skin irritation. Marland Kitchen Keep out of the reach of children.   . DO NOT use if you have a cardiac pacemaker or any other electrically sensitive implanted device. . DO NOT use if you have a known sensitivity to dexamethasone. . DO NOT use during Magnetic Resonance Imaging (MRI). . DO NOT use over broken or compromised skin (e.g. sunburn, cuts, or acne) due to the increased risk of skin reaction. . DO  NOT SHAVE over the area to be treated:  To establish good contact between the Patch and the skin, excessive hair may be clipped. . DO NOT place the Patch or electrodes on or over your eyes, directly over your heart, or brain. . DO NOT reuse the Patch or electrodes as this may cause burns to occur.  Access Code: [email or web address]  URL: [email or web address]  Date: 08/28/2019  Prepared by: Faustino Congress   Exercises  Doorway Pec Stretch at 90 Degrees Abduction - 3 reps - 1 sets - 30 sec hold - 1x daily - 7x weekly  Shoulder External Rotation and Scapular Retraction - 10 reps - 1 sets - 1-2 sec hold - 1x daily - 7x weekly  Seated Scapular Retraction - 10 reps - 1 sets - 5 sec hold - 2x daily - 7x weekly  Scapular Retraction with Resistance - 10 reps - 3 sets - 1x daily - 7x weekly  Patient Education  Trigger Point Dry Needling

## 2019-08-30 ENCOUNTER — Emergency Department (HOSPITAL_BASED_OUTPATIENT_CLINIC_OR_DEPARTMENT_OTHER)
Admission: EM | Admit: 2019-08-30 | Discharge: 2019-08-30 | Disposition: A | Discharge status: Home / Self Care | Payer: Medicare Other | Attending: Emergency Medicine | Admitting: Emergency Medicine

## 2019-08-30 ENCOUNTER — Encounter (HOSPITAL_BASED_OUTPATIENT_CLINIC_OR_DEPARTMENT_OTHER): Payer: Self-pay | Admitting: Emergency Medicine

## 2019-08-30 ENCOUNTER — Other Ambulatory Visit: Payer: Self-pay

## 2019-08-30 ENCOUNTER — Emergency Department (HOSPITAL_BASED_OUTPATIENT_CLINIC_OR_DEPARTMENT_OTHER): Payer: Medicare Other

## 2019-08-30 DIAGNOSIS — R079 Chest pain, unspecified: Secondary | ICD-10-CM

## 2019-08-30 DIAGNOSIS — R0789 Other chest pain: Secondary | ICD-10-CM | POA: Diagnosis not present

## 2019-08-30 DIAGNOSIS — R072 Precordial pain: Secondary | ICD-10-CM | POA: Diagnosis not present

## 2019-08-30 HISTORY — DX: Infarction of spleen: D73.5

## 2019-08-30 LAB — BASIC METABOLIC PANEL
Anion gap: 10 (ref 5–15)
BUN: 12 mg/dL (ref 8–23)
CO2: 21 mmol/L — ABNORMAL LOW (ref 22–32)
Calcium: 9.4 mg/dL (ref 8.9–10.3)
Chloride: 105 mmol/L (ref 98–111)
Creatinine, Ser: 0.45 mg/dL (ref 0.44–1.00)
GFR calc Af Amer: >60 mL/min (ref >60)
GFR calc non Af Amer: >60 mL/min (ref >60)
Glucose, Bld: 88 mg/dL (ref 70–99)
Potassium: 3.6 mmol/L (ref 3.5–5.1)
Sodium: 136 mmol/L (ref 135–145)

## 2019-08-30 LAB — CBC
HCT: 40.1 % (ref 36.0–46.0)
Hemoglobin: 13.9 g/dL (ref 12.0–15.0)
MCH: 34.4 pg — ABNORMAL HIGH (ref 26.0–34.0)
MCHC: 34.7 g/dL (ref 30.0–36.0)
MCV: 99.3 fL (ref 80.0–100.0)
Platelets: 299 10*3/uL (ref 150–400)
RBC: 4.04 MIL/uL (ref 3.87–5.11)
RDW: 14.1 % (ref 11.5–15.5)
WBC: 5.2 10*3/uL (ref 4.0–10.5)
nRBC: 0 % (ref 0.0–0.2)

## 2019-08-30 LAB — TROPONIN I (HIGH SENSITIVITY)
Troponin I (High Sensitivity): 3 ng/L (ref <18)
Troponin I (High Sensitivity): 4 ng/L (ref <18)

## 2019-08-30 IMAGING — DX DG CHEST 1V PORT
1 series · 1 of 1 positions shown · non-contrast
Comparison: Chest x-ray [DATE].

CLINICAL DATA: 67-year-old female with history of chest pain for
the past 3 hours. Nausea. No fever.

EXAM:
PORTABLE CHEST 1 VIEW

[chest ap]
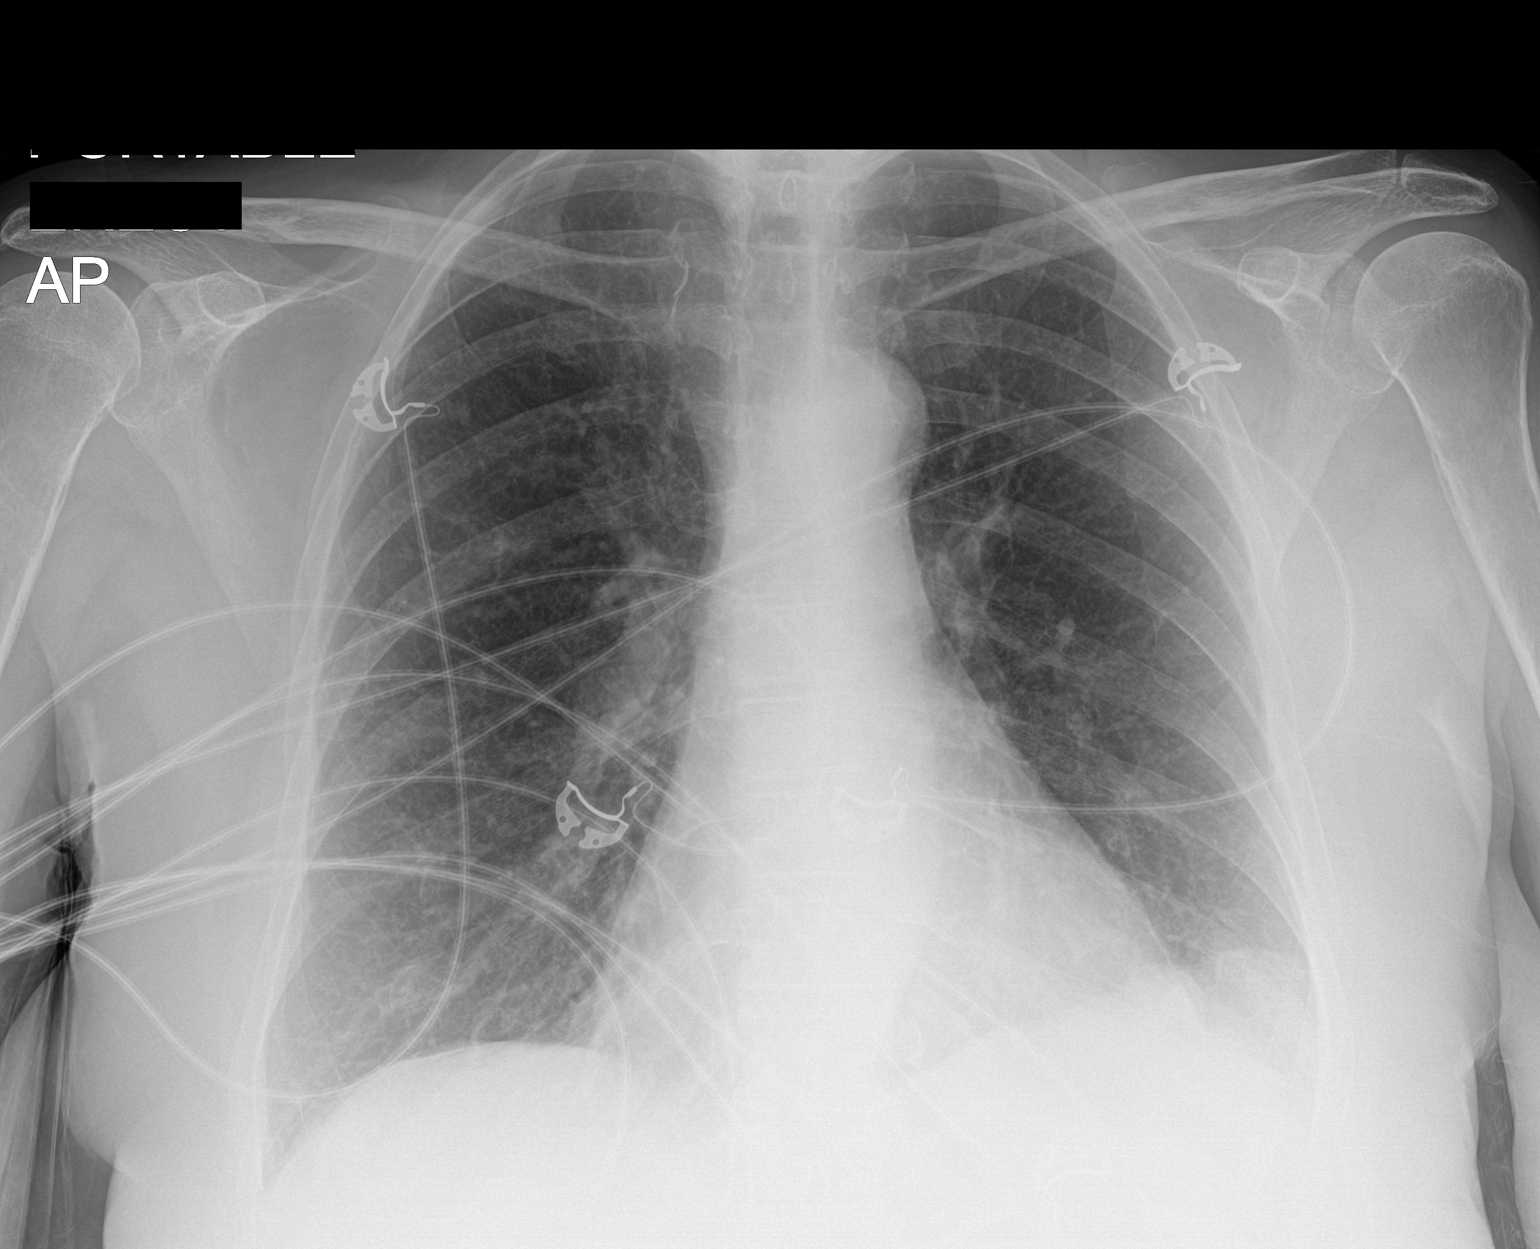

[1 of 1 positions shown; findings below may reference images not displayed]

FINDINGS: Lung volumes are normal. Mild chronic elevation of the left
hemidiaphragm. No acute consolidative airspace disease. No pleural
effusions. No pneumothorax. No suspicious appearing pulmonary
nodules or masses are noted. No evidence of pulmonary edema. Heart
size is normal. Upper mediastinal contours are within normal limits.
IMPRESSION: 1. No radiographic evidence of acute cardiopulmonary disease.

## 2019-08-30 MED ORDER — LIDOCAINE VISCOUS HCL 2 % MT SOLN
15.0000 mL | Freq: Once | OROMUCOSAL | Status: AC
  Administered 2019-08-30: 10:00:00 15 mL via ORAL
  Filled 2019-08-30: qty 15

## 2019-08-30 MED ORDER — ALUM & MAG HYDROXIDE-SIMETH 200-200-20 MG/5ML PO SUSP
30.0000 mL | Freq: Once | ORAL | Status: AC
  Administered 2019-08-30: 30 mL via ORAL
  Filled 2019-08-30: qty 30

## 2019-08-30 MED ORDER — ASPIRIN 81 MG PO CHEW
324.0000 mg | CHEWABLE_TABLET | Freq: Once | ORAL | Status: AC
  Administered 2019-08-30: 10:00:00 324 mg via ORAL
  Filled 2019-08-30: qty 4

## 2019-08-30 NOTE — ED Triage Notes (Signed)
Pt woke up this morning with midline chest pain and nausea. Hx of reflux, but this feels different. Hx of splenectomy

## 2019-08-30 NOTE — ED Notes (Signed)
Pt given water per RN approval.   Pt on cardiac monitor and auto VS

## 2019-08-30 NOTE — ED Provider Notes (Signed)
[facility] EMERGENCY DEPARTMENT Provider Note   CSN: [id number] Arrival date & time: 08/30/19  [id number]     History   Chief Complaint Chief Complaint  Patient presents with  . Chest Pain  . Nausea    HPI Donna Ramsey is a 68 y.o. female.  She has no prior cardiac disease.  She said she woke up around 7 AM with central chest pain pressure that radiated through to her back.  Was associated with nausea.  She says this is similar to her reflux but not exactly.  She tried hyoscyamine and some Pepcid AC without any improvement.  She rates the pain is 4 out of 10.  No shortness of breath no change with breathing no vomiting diarrhea abdominal pain leg swelling  HPI: A 68 year old patient presents for evaluation of chest pain. Initial onset of pain was approximately 3-6 hours ago. The patient's chest pain is described as heaviness/pressure/tightness and is not worse with exertion. The patient complains of nausea. The patient's chest pain is middle- or left-sided, is not well-localized, is not sharp and does not radiate to the arms/jaw/neck. The patient denies diaphoresis. The patient has no history of stroke, has no history of peripheral artery disease, has not smoked in the past 90 days, denies any history of treated diabetes, has no relevant family history of coronary artery disease (first degree relative at less than age 13), is not hypertensive, has no history of hypercholesterolemia and does not have an elevated BMI (>=30).   The history is provided by the patient.  Chest Pain Pain location:  Substernal area Pain quality: pressure   Pain radiates to:  Mid back Pain severity:  Moderate Onset quality:  Gradual Duration:  4 hours Timing:  Constant Progression:  Unchanged Chronicity:  New Context: at rest   Relieved by:  Nothing Worsened by:  Nothing Ineffective treatments:  Antacids Associated symptoms: back pain, heartburn and nausea   Associated symptoms: no abdominal pain,  no cough, no diaphoresis, no dysphagia, no fever, no headache, no lower extremity edema, no shortness of breath and no vomiting     Past Medical History:  Diagnosis Date  . Family history of breast cancer   . Splenic infarct     Patient Active Problem List   Diagnosis Date Noted  . Genetic testing 07/06/2019  . Family history of breast cancer   . Primary osteoarthritis of left hip 05/13/2019  . 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  . Osteopenia 05/13/2019  . Peroneal tendinitis, left 02/16/2019  . Acute shoulder bursitis, left 02/16/2019  . 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 Fracture of fibula, left, closed 12/24/2018    Past Surgical History:  Procedure Laterality Date  . ABDOMINAL HYSTERECTOMY    . FOOT SURGERY    . SPLENECTOMY, TOTAL       OB History   No obstetric history on file.      Home Medications    Prior to Admission medications   Medication Sig Start Date End Date Taking? Authorizing Provider  b complex vitamins capsule Take 1 capsule by mouth daily.    [provider]  Calcium Carbonate-Vitamin D 600-400 MG-UNIT tablet Take 1 tablet by mouth 2 (two) times daily. 05/13/19   Silverio Decamp, MD  cetirizine (ZYRTEC) 10 MG tablet Take 10 mg by mouth daily.    [provider]  famotidine-calcium carbonate-magnesium hydroxide (PEPCID COMPLETE) 10-800-165 MG chewable tablet Chew 1 tablet by mouth daily as  needed.    [provider]  Multiple Vitamins-Minerals (MULTIVITAMIN ADULT PO) Take by mouth.    [provider]    Family History Family History  Problem Relation Age of Onset  . Breast cancer Mother 16  . Breast cancer Paternal Aunt   . Breast cancer Paternal Grandmother        dx 82s    Social History Social History   Tobacco Use  . Smoking status: Never Smoker  . Smokeless tobacco: Never Used  Substance Use Topics  . Alcohol use: Yes    Comment:  occ  . Drug use: Never     Allergies   Cephalosporins, Codeine, Penicillins, Levofloxacin, and Sulfa antibiotics   Review of Systems Review of Systems  Constitutional: Negative for diaphoresis and fever.  HENT: Negative for sore throat and trouble swallowing.   Eyes: Negative for visual disturbance.  Respiratory: Negative for cough and shortness of breath.   Cardiovascular: Positive for chest pain.  Gastrointestinal: Positive for heartburn and nausea. Negative for abdominal pain and vomiting.  Genitourinary: Negative for dysuria.  Musculoskeletal: Positive for back pain.  Skin: Negative for rash.  Neurological: Negative for headaches.     Physical Exam Updated Vital Signs BP 138/84 (BP Location: Right Arm)   Pulse 72   Temp 97.8 F (36.6 C) (Oral)   Resp 19   Ht 5\' 10"  (1.778 m)   Wt 84.4 kg   SpO2 99%   BMI 26.69 kg/m   Physical Exam Vitals signs and nursing note reviewed.  Constitutional:      General: She is not in acute distress.    Appearance: She is well-developed.  HENT:     Head: Normocephalic and atraumatic.  Eyes:     Conjunctiva/sclera: Conjunctivae normal.  Neck:     Musculoskeletal: Neck supple.  Cardiovascular:     Rate and Rhythm: Normal rate and regular rhythm.     Pulses:          Radial pulses are 2+ on the right side and 2+ on the left side.     Heart sounds: Normal heart sounds. No murmur.  Pulmonary:     Effort: Pulmonary effort is normal. No respiratory distress.     Breath sounds: Normal breath sounds.  Abdominal:     Palpations: Abdomen is soft.     Tenderness: There is no abdominal tenderness.  Musculoskeletal: Normal range of motion.     Right lower leg: She exhibits no tenderness. No edema.     Left lower leg: She exhibits no tenderness. No edema.  Skin:    General: Skin is warm and dry.     Capillary Refill: Capillary refill takes less than 2 seconds.  Neurological:     General: No focal deficit present.     Mental Status:  She is alert.      ED Treatments / Results  Labs (all labs ordered are listed, but only abnormal results are displayed) Labs Reviewed  BASIC METABOLIC PANEL - Abnormal; Notable for the following components:      Result Value   CO2 21 (*)    All other components within normal limits  CBC - Abnormal; Notable for the following components:   MCH 34.4 (*)    All other components within normal limits  TROPONIN I (HIGH SENSITIVITY)  TROPONIN I (HIGH SENSITIVITY)    EKG EKG Interpretation  Date/Time:  Sunday August 30 2019 09:36:53 EDT Ventricular Rate:  67 PR Interval:    QRS Duration:  101 QT Interval:  415 QTC Calculation: 439 R Axis:   51 Text Interpretation:  Sinus rhythm Lateral infarct, old No old tracing to compare Confirmed by Aletta Edouard [phone]) on 08/30/2019 9:40:36 AM   Radiology Dg Chest Port 1 View  Result Date: 08/30/2019 CLINICAL DATA:  67 year old female with history of chest pain for the past 3 hours. Nausea. No fever. EXAM: PORTABLE CHEST 1 VIEW COMPARISON:  Chest x-ray 08/11/2014. FINDINGS: Lung volumes are normal. Mild chronic elevation of the left hemidiaphragm. No acute consolidative airspace disease. No pleural effusions. No pneumothorax. No suspicious appearing pulmonary nodules or masses are noted. No evidence of pulmonary edema. Heart size is normal. Upper mediastinal contours are within normal limits. IMPRESSION: 1. No radiographic evidence of acute cardiopulmonary disease. Electronically Signed   By: [name] M.D.   On: 08/30/2019 10:13    Procedures Procedures (including critical care time)  Medications Ordered in ED Medications  aspirin chewable tablet 324 mg (324 mg Oral Given 08/30/19 1012)  alum & mag hydroxide-simeth (MAALOX/MYLANTA) 200-200-20 MG/5ML suspension 30 mL (30 mLs Oral Given 08/30/19 1012)    And  lidocaine (XYLOCAINE) 2 % viscous mouth solution 15 mL (15 mLs Oral Given 08/30/19 1012)     Initial Impression /  Assessment and Plan / ED Course  I have reviewed the triage vital signs and the nursing notes.  Pertinent labs & imaging results that were available during my care of the patient were reviewed by me and considered in my medical decision making (see chart for details).  Clinical Course as of Aug 29 1654  Sun Aug 30, 4139  6919 68 year old female with no prior cardiac history here with chest pain that began around 7 AM.  Differential includes ACS, GERD, pneumonia, pneumothorax, vascular disease.  Getting aspirin EKG chest x-ray cardiac work-up.  Trying GI cocktail.   [MB]  1018 Patient chest x-ray reviewed by me and unremarkable.   [MB]  1330 Patient states her pain is down to about a 2.  Her delta troponin is been normal chest x-ray and lab work unremarkable.  She is comfortable going home and we talked about doing Maalox between meals and at bedtime and her restarting her PPI.  She understands to follow-up with her doctor and return if any worsening symptoms.   [MB]    Clinical Course User Index [MB] Hayden Rasmussen, MD    HEAR Score: 3   Final Clinical Impressions(s) / ED Diagnoses   Final diagnoses:  Nonspecific chest pain    ED Discharge Orders    None       Hayden Rasmussen, MD 08/30/19 1655

## 2019-08-30 NOTE — ED Notes (Signed)
ED Provider at bedside. 

## 2019-08-30 NOTE — Discharge Instructions (Addendum)
You were seen in the emergency department for chest pain.  You had an EKG chest x-ray and blood work that did not show any serious findings.  This is possibly related to acid reflux and you should continue your antacids with Maalox in between pills at at bedtime and restart your PPI.  Will be important for you to contact your primary care doctor for further evaluation and please return to the emergency department for any worsening symptoms.

## 2019-09-01 ENCOUNTER — Other Ambulatory Visit: Payer: Self-pay

## 2019-09-01 ENCOUNTER — Ambulatory Visit (INDEPENDENT_AMBULATORY_CARE_PROVIDER_SITE_OTHER): Payer: Medicare Other | Admitting: Physical Therapy

## 2019-09-01 ENCOUNTER — Encounter: Payer: Self-pay | Admitting: Physical Therapy

## 2019-09-01 DIAGNOSIS — M25512 Pain in left shoulder: Secondary | ICD-10-CM | POA: Diagnosis not present

## 2019-09-01 DIAGNOSIS — G8929 Other chronic pain: Secondary | ICD-10-CM

## 2019-09-01 DIAGNOSIS — R293 Abnormal posture: Secondary | ICD-10-CM

## 2019-09-01 DIAGNOSIS — M6281 Muscle weakness (generalized): Secondary | ICD-10-CM

## 2019-09-01 NOTE — Therapy (Signed)
[place], [place], [address] Phone: [phone]   Fax:  [phone]  Physical Therapy Treatment  Patient Details  Name: Donna Ramsey MRN: [id number] Date of Birth: 1951/01/06 Referring Provider (PT): Silverio Decamp, MD   Encounter Date: 09/01/2019  PT End of Session - 09/01/19 0926    Visit Number  2    Number of Visits  12    Date for PT Re-Evaluation  10/09/19    PT Start Time  0844    PT Stop Time  0925    PT Time Calculation (min)  41 min    Activity Tolerance  Patient tolerated treatment well    Behavior During Therapy  [facility] for tasks assessed/performed       Past Medical History:  Diagnosis Date  . Family history of breast cancer   . Splenic infarct     Past Surgical History:  Procedure Laterality Date  . ABDOMINAL HYSTERECTOMY    . FOOT SURGERY    . SPLENECTOMY, TOTAL      There were no vitals filed for this visit.  Subjective Assessment - 09/01/19 0841    Subjective  pain is very minimal; injection and dex seemed to be helpful    Limitations  Lifting    Diagnostic tests  MRI: Rotator cuff tendinopathy, biceps tendinitis and displacement    Patient Stated Goals  improve shoulder pain, get shoulder to heal    Currently in Pain?  No/denies    Pain Onset  More than a month ago                       [facility] Adult PT Treatment/Exercise - 09/01/19 0846      Shoulder Exercises: Standing   External Rotation  Both;15 reps;Theraband    Theraband Level (Shoulder External Rotation)  Level 2 (Red)    Row  Both;10 reps;Theraband    Theraband Level (Shoulder Row)  Level 2 (Red)      Shoulder Exercises: ROM/Strengthening   UBE (Upper Arm Bike)  L2 x 4 min (2' each direction)      Shoulder Exercises: Stretch   Other Shoulder Stretches  mid level doorway stretch 2 x 30 sec      Manual Therapy   Manual Therapy  Soft tissue mobilization    Soft tissue  mobilization  Lt upper trap into supraspinatus, infraspinatus and teres minor       Trigger Point Dry Needling - 09/01/19 1700    Consent Given?  Yes    Education Handout Provided  Yes    Muscles Treated Head and Neck  Upper trapezius    Muscles Treated Upper Quadrant  Subscapularis;Supraspinatus    Upper Trapezius Response  Twitch reponse elicited;Palpable increased muscle length    Supraspinatus Response  Twitch response elicited;Palpable increased muscle length    Subscapularis Response  Twitch response elicited;Palpable increased muscle length                PT Long Term Goals - 08/28/19 1203      PT LONG TERM GOAL #1   Title  independent with HEP    Status  New    Target Date  10/09/19      PT LONG TERM GOAL #2   Title  FOTO score improved to </= 31% limited for improved function    Status  New    Target Date  10/09/19      PT LONG TERM  GOAL #3   Title  demonstrate at least 4/5 Lt shoulder strength for improved function    Status  New    Target Date  10/09/19      PT LONG TERM GOAL #4   Title  report pain < 3/10 with lifting activities for improved function    Status  New    Target Date  10/09/19            Plan - 09/01/19 0926    Clinical Impression Statement  Pt tolerated DN and manual therapy well today, and overall doing well without increase in pain.  Held ionto today to see how pt responds without it.  Overall progressing well, no goals consistently met at this time.    Personal Factors and Comorbidities  Time since onset of injury/illness/exacerbation;Comorbidity 2    Comorbidities  OA, multiple tendinopathies    Examination-Activity Limitations  Lift;Reach Overhead    Examination-Participation Restrictions  Yard Work    Stability/Clinical Decision Making  Stable/Uncomplicated    Rehab Potential  Good    PT Frequency  2x / week    PT Duration  6 weeks    PT Treatment/Interventions  ADLs/Self Care Home Management;Cryotherapy;Electrical  Stimulation;Ultrasound;Moist Heat;Iontophoresis 53m/ml Dexamethasone;Functional mobility training;Therapeutic activities;Therapeutic exercise;Patient/family education;Manual techniques;Passive range of motion;Vasopneumatic Device;Taping;Dry needling    PT Next Visit Plan  review HEP and progress posture exercises; manual/modalities/DN; assess response to DN    PT Home Exercise Plan  Access Code: [email or web address]   Consulted and Agree with Plan of Care  Patient       Patient will benefit from skilled therapeutic intervention in order to improve the following deficits and impairments:  Increased fascial restricitons, Increased muscle spasms, Pain, Impaired UE functional use, Decreased strength, Postural dysfunction  Visit Diagnosis: Chronic left shoulder pain  Abnormal posture  Muscle weakness (generalized)     Problem List Patient Active Problem List   Diagnosis Date Noted  . Genetic testing 07/06/2019  . Family history of breast cancer   . Primary osteoarthritis of left hip 05/13/2019  . 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  . Osteopenia 05/13/2019  . Peroneal tendinitis, left 02/16/2019  . Acute shoulder bursitis, left 02/16/2019  . 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 Fracture of fibula, left, closed 12/24/2018      SLaureen Abrahams PT, DPT 09/01/19 9:27 AM     [facility] [address]Phone: [phone]  Fax:  [phone] Name: Donna FojtikMRN: [id number] of Birth: [birth year]

## 2019-09-04 ENCOUNTER — Ambulatory Visit (INDEPENDENT_AMBULATORY_CARE_PROVIDER_SITE_OTHER): Payer: Medicare Other | Admitting: Physical Therapy

## 2019-09-04 ENCOUNTER — Other Ambulatory Visit: Payer: Self-pay

## 2019-09-04 ENCOUNTER — Encounter: Payer: Self-pay | Admitting: Physical Therapy

## 2019-09-04 DIAGNOSIS — G8929 Other chronic pain: Secondary | ICD-10-CM

## 2019-09-04 DIAGNOSIS — R293 Abnormal posture: Secondary | ICD-10-CM | POA: Diagnosis not present

## 2019-09-04 DIAGNOSIS — M6281 Muscle weakness (generalized): Secondary | ICD-10-CM | POA: Diagnosis not present

## 2019-09-04 DIAGNOSIS — M25512 Pain in left shoulder: Secondary | ICD-10-CM

## 2019-09-04 NOTE — Therapy (Signed)
[place], [place], [address] Phone: [phone]   Fax:  [phone]  Physical Therapy Treatment  Patient Details  Name: Donna Ramsey MRN: [id number] Date of Birth: 1951/02/25 Referring Provider (PT): Silverio Decamp, MD   Encounter Date: 09/04/2019  PT End of Session - 09/04/19 0933    Visit Number  3    Number of Visits  12    Date for PT Re-Evaluation  10/09/19    PT Start Time  0931    PT Stop Time  1009    PT Time Calculation (min)  38 min    Activity Tolerance  Patient tolerated treatment well;No increased pain    Behavior During Therapy  WFL for tasks assessed/performed       Past Medical History:  Diagnosis Date  . Family history of breast cancer   . Splenic infarct     Past Surgical History:  Procedure Laterality Date  . ABDOMINAL HYSTERECTOMY    . FOOT SURGERY    . SPLENECTOMY, TOTAL      There were no vitals filed for this visit.  Subjective Assessment - 09/04/19 0933    Subjective  Shoulder is feeling pretty good.  Hasn't been able to do her exercises as much due to a GERD flare up.    Diagnostic tests  MRI: Rotator cuff tendinopathy, biceps tendinitis and displacement    Patient Stated Goals  improve shoulder pain, get shoulder to heal    Currently in Pain?  No/denies    Pain Score  0-No pain         [facility] PT Assessment - 09/04/19 0001      Assessment   Medical Diagnosis  M75.52 (ICD-10-CM) - Acute shoulder bursitis, left    Referring Provider (PT)  Silverio Decamp, MD    Onset Date/Surgical Date  --   Jan 2020   Hand Dominance  Right    Next MD Visit  2-3 weeks    Prior Therapy  for neck pain/vocal cord infections       [facility] Adult PT Treatment/Exercise - 09/04/19 0001      Shoulder Exercises: Standing   External Rotation  Both;15 reps;Theraband    Theraband Level (Shoulder External Rotation)  Level 2 (Red)    Flexion  Strengthening;Left;10  reps   to 90 deg   Shoulder Flexion Weight (lbs)  2    ABduction  Strengthening;Left;10 reps   scaption to 90 deg   Shoulder ABduction Weight (lbs)  2    ABduction Limitations  mirror for feedback    Extension  Both;10 reps    Theraband Level (Shoulder Extension)  Level 2 (Red)    Extension Limitations  3 sec hold    Row  Both;10 reps;Theraband    Theraband Level (Shoulder Row)  Level 2 (Red)    Row Limitations  3 sec hold    Other Standing Exercises  Lt shoulder flexion waist to overhead shelf x 10 with 3#      Shoulder Exercises: ROM/Strengthening   UBE (Upper Arm Bike)  L2 x 3 min (1.5' each direction)      Shoulder Exercises: Stretch   Other Shoulder Stretches  mid level doorway stretch 2 x 30 sec    Other Shoulder Stretches  bicep stretch at wall x 20 sec each, then bilat x 20 sec, high level doorway stretch x 15 sec x 2  PT Education - 09/04/19 1136    Education Details  HEP,  anatomy of shoulder / how posture affects function    Person(s) Educated  Patient    Methods  Explanation;Demonstration;Verbal cues;Handout;Tactile cues    Comprehension  Returned demonstration;Verbalized understanding          PT Long Term Goals - 08/28/19 1203      PT LONG TERM GOAL #1   Title  independent with HEP    Status  New    Target Date  10/09/19      PT LONG TERM GOAL #2   Title  FOTO score improved to </= 31% limited for improved function    Status  New    Target Date  10/09/19      PT LONG TERM GOAL #3   Title  demonstrate at least 4/5 Lt shoulder strength for improved function    Status  New    Target Date  10/09/19      PT LONG TERM GOAL #4   Title  report pain < 3/10 with lifting activities for improved function    Status  New    Target Date  10/09/19            Plan - 09/04/19 1126    Clinical Impression Statement  Pt reported good response with DN, noticeable reduction of tightness.  Pt tolerated exercises well, without increase in pain.   Pt progressing towards goals.    Personal Factors and Comorbidities  Time since onset of injury/illness/exacerbation;Comorbidity 2    Comorbidities  OA, multiple tendinopathies    Examination-Activity Limitations  Lift;Reach Overhead    Examination-Participation Restrictions  Yard Work    Stability/Clinical Decision Making  Stable/Uncomplicated    Rehab Potential  Good    PT Frequency  2x / week    PT Duration  6 weeks    PT Treatment/Interventions  ADLs/Self Care Home Management;Cryotherapy;Electrical Stimulation;Ultrasound;Moist Heat;Iontophoresis 4mg /ml Dexamethasone;Functional mobility training;Therapeutic activities;Therapeutic exercise;Patient/family education;Manual techniques;Passive range of motion;Vasopneumatic Device;Taping;Dry needling    PT Next Visit Plan  progress posture exercises; manual/modalities/DN. assess strength gains.    PT Home Exercise Plan  Access Code: [email or web address]    Consulted and Agree with Plan of Care  Patient       Patient will benefit from skilled therapeutic intervention in order to improve the following deficits and impairments:  Increased fascial restricitons, Increased muscle spasms, Pain, Impaired UE functional use, Decreased strength, Postural dysfunction  Visit Diagnosis: Chronic left shoulder pain  Abnormal posture  Muscle weakness (generalized)     Problem List Patient Active Problem List   Diagnosis Date Noted  . Genetic testing 07/06/2019  . Family history of breast cancer   . Primary osteoarthritis of left hip 05/13/2019  . 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  . Osteopenia 05/13/2019  . Peroneal tendinitis, left 02/16/2019  . Acute shoulder bursitis, left 02/16/2019  . 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 Fracture of fibula, left, closed 12/24/2018   Kerin Perna, PTA 09/04/19 11:38 AM  [place], [place], [address] Phone: [phone]   Fax:  [phone]  Name: Donna Ramsey MRN: [id number] Date of Birth: [birth year]

## 2019-09-08 ENCOUNTER — Other Ambulatory Visit: Payer: Self-pay

## 2019-09-08 ENCOUNTER — Encounter: Payer: Self-pay | Admitting: Physical Therapy

## 2019-09-08 ENCOUNTER — Ambulatory Visit (INDEPENDENT_AMBULATORY_CARE_PROVIDER_SITE_OTHER): Payer: Medicare Other | Admitting: Physical Therapy

## 2019-09-08 DIAGNOSIS — M6281 Muscle weakness (generalized): Secondary | ICD-10-CM

## 2019-09-08 DIAGNOSIS — M25512 Pain in left shoulder: Secondary | ICD-10-CM | POA: Diagnosis not present

## 2019-09-08 DIAGNOSIS — R293 Abnormal posture: Secondary | ICD-10-CM

## 2019-09-08 DIAGNOSIS — G8929 Other chronic pain: Secondary | ICD-10-CM

## 2019-09-08 NOTE — Therapy (Signed)
[place], [place], [address] Phone: [phone]   Fax:  [phone]  Physical Therapy Treatment  Patient Details  Name: Donna Ramsey MRN: [id number] Date of Birth: 1951/06/22 Referring Provider (PT): Silverio Decamp, MD   Encounter Date: 09/08/2019  PT End of Session - 09/08/19 0924    Visit Number  4    Number of Visits  12    Date for PT Re-Evaluation  10/09/19    PT Start Time  [id number]    PT Stop Time  0924    PT Time Calculation (min)  40 min    Activity Tolerance  Patient tolerated treatment well;No increased pain    Behavior During Therapy  WFL for tasks assessed/performed       Past Medical History:  Diagnosis Date  . Family history of breast cancer   . Splenic infarct     Past Surgical History:  Procedure Laterality Date  . ABDOMINAL HYSTERECTOMY    . FOOT SURGERY    . SPLENECTOMY, TOTAL      There were no vitals filed for this visit.  Subjective Assessment - 09/08/19 0844    Subjective  shoulder is dong well; Rt shoulder seems to be a little bit more symptomatic.    Diagnostic tests  MRI: Rotator cuff tendinopathy, biceps tendinitis and displacement    Patient Stated Goals  improve shoulder pain, get shoulder to heal    Currently in Pain?  No/denies         [facility] PT Assessment - 09/08/19 0916      Assessment   Medical Diagnosis  M75.52 (ICD-10-CM) - Acute shoulder bursitis, left    Referring Provider (PT)  Silverio Decamp, MD      Strength   Left Shoulder Flexion  4/5    Left Shoulder ABduction  3+/5   with pain   Left Shoulder Internal Rotation  5/5    Left Shoulder External Rotation  4/5                   [facility] Adult PT Treatment/Exercise - 09/08/19 0846      Shoulder Exercises: Supine   Protraction  Both;10 reps;Weights    Protraction Weight (lbs)  2    External Rotation  Left;15 reps;Weights    External Rotation Weight (lbs)  2     Flexion  Both;10 reps;Weights    Shoulder Flexion Weight (lbs)  2    ABduction  Left;15 reps;Weights    Shoulder ABduction Weight (lbs)  2      Shoulder Exercises: Standing   External Rotation  Both;15 reps;Theraband    Theraband Level (Shoulder External Rotation)  Level 3 (Green)    Flexion  Strengthening;Both;10 reps;Weights    Shoulder Flexion Weight (lbs)  2    ABduction  Strengthening;Both;10 reps;Weights    Shoulder ABduction Weight (lbs)  2    Extension  Both;15 reps    Theraband Level (Shoulder Extension)  Level 3 (Green)    Extension Limitations  5 sec hold    Row  Both;Theraband;15 reps    Theraband Level (Shoulder Row)  Level 3 (Green)    Row Limitations  5 sec hold    Diagonals  Both;10 reps;Theraband    Theraband Level (Shoulder Diagonals)  Level 3 (Green)    Other Standing Exercises  bil shoulder flexion waist to overhead shelf x 10 with 2#      Shoulder Exercises: ROM/Strengthening   UBE (Upper Arm  Bike)  L3 x 4 min (2' each direction)      Shoulder Exercises: Stretch   Other Shoulder Stretches  mid level doorway stretch 2 x 30 sec                  PT Long Term Goals - 08/28/19 1203      PT LONG TERM GOAL #1   Title  independent with HEP    Status  New    Target Date  10/09/19      PT LONG TERM GOAL #2   Title  FOTO score improved to </= 31% limited for improved function    Status  New    Target Date  10/09/19      PT LONG TERM GOAL #3   Title  demonstrate at least 4/5 Lt shoulder strength for improved function    Status  New    Target Date  10/09/19      PT LONG TERM GOAL #4   Title  report pain < 3/10 with lifting activities for improved function    Status  New    Target Date  10/09/19            Plan - 09/08/19 0925    Clinical Impression Statement  Pt tolerated strengthening session well today with mild reports of fatigue.  Progressing well towards goals.  Some improvement noted in Lt shoulder strength today.    Personal  Factors and Comorbidities  Time since onset of injury/illness/exacerbation;Comorbidity 2    Comorbidities  OA, multiple tendinopathies    Examination-Activity Limitations  Lift;Reach Overhead    Examination-Participation Restrictions  Yard Work    Stability/Clinical Decision Making  Stable/Uncomplicated    Rehab Potential  Good    PT Frequency  2x / week    PT Duration  6 weeks    PT Treatment/Interventions  ADLs/Self Care Home Management;Cryotherapy;Electrical Stimulation;Ultrasound;Moist Heat;Iontophoresis 4mg /ml Dexamethasone;Functional mobility training;Therapeutic activities;Therapeutic exercise;Patient/family education;Manual techniques;Passive range of motion;Vasopneumatic Device;Taping;Dry needling    PT Next Visit Plan  progress posture exercises; manual/modalities/DN. assess strength gains.    PT Home Exercise Plan  Access Code: [email or web address]    Consulted and Agree with Plan of Care  Patient       Patient will benefit from skilled therapeutic intervention in order to improve the following deficits and impairments:  Increased fascial restricitons, Increased muscle spasms, Pain, Impaired UE functional use, Decreased strength, Postural dysfunction  Visit Diagnosis: Abnormal posture  Chronic left shoulder pain  Muscle weakness (generalized)     Problem List Patient Active Problem List   Diagnosis Date Noted  . Genetic testing 07/06/2019  . Family history of breast cancer   . Primary osteoarthritis of left hip 05/13/2019  . 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  . Osteopenia 05/13/2019  . Peroneal tendinitis, left 02/16/2019  . Acute shoulder bursitis, left 02/16/2019  . 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 Fracture of fibula, left, closed 12/24/2018       Laureen Abrahams, PT, DPT 09/08/19 9:26 AM      [facility] [place], [place],  [address] Phone: [phone]   Fax:  [phone]  Name: Donna Ramsey MRN: [id number] Date of Birth: [birth year]

## 2019-09-11 ENCOUNTER — Encounter: Payer: Self-pay | Admitting: Physical Therapy

## 2019-09-11 ENCOUNTER — Ambulatory Visit (INDEPENDENT_AMBULATORY_CARE_PROVIDER_SITE_OTHER): Payer: Medicare Other | Admitting: Physical Therapy

## 2019-09-11 ENCOUNTER — Other Ambulatory Visit: Payer: Self-pay

## 2019-09-11 DIAGNOSIS — M6281 Muscle weakness (generalized): Secondary | ICD-10-CM

## 2019-09-11 DIAGNOSIS — G8929 Other chronic pain: Secondary | ICD-10-CM | POA: Diagnosis not present

## 2019-09-11 DIAGNOSIS — R293 Abnormal posture: Secondary | ICD-10-CM | POA: Diagnosis not present

## 2019-09-11 DIAGNOSIS — M25512 Pain in left shoulder: Secondary | ICD-10-CM | POA: Diagnosis not present

## 2019-09-11 NOTE — Therapy (Signed)
[place], [place], [address] Phone: [phone]   Fax:  [phone]  Physical Therapy Treatment  Patient Details  Name: Donna Ramsey MRN: [id number] Date of Birth: 07/14/1951 Referring Provider (PT): Silverio Decamp, MD   Encounter Date: 09/11/2019  PT End of Session - 09/11/19 0937    Visit Number  5    Number of Visits  12    Date for PT Re-Evaluation  10/09/19    PT Start Time  0935    PT Stop Time  1008    PT Time Calculation (min)  33 min       Past Medical History:  Diagnosis Date  . Family history of breast cancer   . Splenic infarct     Past Surgical History:  Procedure Laterality Date  . ABDOMINAL HYSTERECTOMY    . FOOT SURGERY    . SPLENECTOMY, TOTAL      There were no vitals filed for this visit.  Subjective Assessment - 09/11/19 0937    Subjective  Rt shoulder continues to be irritated.  She's doing HEP 1x/day; working a lot on the yard.    Patient Stated Goals  improve shoulder pain, get shoulder to heal    Currently in Pain?  Yes    Pain Score  1     Pain Location  Shoulder    Pain Orientation  Right    Pain Descriptors / Indicators  Sore    Aggravating Factors   ?    Pain Relieving Factors  rest         [facility] PT Assessment - 09/11/19 0001      Assessment   Medical Diagnosis  M75.52 (ICD-10-CM) - Acute shoulder bursitis, left    Referring Provider (PT)  Silverio Decamp, MD    Next MD Visit  not scheduled yet.        [facility] Adult PT Treatment/Exercise - 09/11/19 0001      Shoulder Exercises: Sidelying   External Rotation  Strengthening;Left;10 reps   2 sets   External Rotation Weight (lbs)  3    ABduction  Strengthening;Left;10 reps   2 sets   ABduction Weight (lbs)  3      Shoulder Exercises: Standing   Flexion  Strengthening;Right;Left;10 reps;Weights    Shoulder Flexion Weight (lbs)  3   mirror for feedback   Row  Both;Theraband;15  reps    Theraband Level (Shoulder Row)  Level 3 (Green)    Row Limitations  5 sec hold    Diagonals  Strengthening;Left;10 reps;Theraband    Theraband Level (Shoulder Diagonals)  Level 3 (Green)    Other Standing Exercises  Lt shoulder flexion waist to overhead shelf x 5 with 3#, 5 with 4#      Shoulder Exercises: ROM/Strengthening   UBE (Upper Arm Bike)  L3 x 3.5 min (1.5' each direction)    Wall Pushups  10 reps    Wall Pushups Limitations  and reverse wall push ups x 10       Shoulder Exercises: Stretch   Other Shoulder Stretches  mid level doorway stretch 2 x 30 sec    Other Shoulder Stretches  bicep stretch at wall x 20 sec each, then bilat x 20 sec, high level doorway stretch x 15 sec x 2            PT Long Term Goals - 08/28/19 1203      PT LONG TERM GOAL #1  Title  independent with HEP    Status  New    Target Date  10/09/19      PT LONG TERM GOAL #2   Title  FOTO score improved to </= 31% limited for improved function    Status  New    Target Date  10/09/19      PT LONG TERM GOAL #3   Title  demonstrate at least 4/5 Lt shoulder strength for improved function    Status  New    Target Date  10/09/19      PT LONG TERM GOAL #4   Title  report pain < 3/10 with lifting activities for improved function    Status  New    Target Date  10/09/19            Plan - 09/11/19 1011    Clinical Impression Statement  Pt tolerated exercises well, with continued soreness in Rt shoulder.  Strength gradually improving in Lt shoulder.  Making great progress towards goals.    Personal Factors and Comorbidities  Time since onset of injury/illness/exacerbation;Comorbidity 2    Comorbidities  OA, multiple tendinopathies    Examination-Activity Limitations  Lift;Reach Overhead    Examination-Participation Restrictions  Yard Work    Stability/Clinical Decision Making  Stable/Uncomplicated    Rehab Potential  Good    PT Frequency  2x / week    PT Duration  6 weeks    PT  Treatment/Interventions  ADLs/Self Care Home Management;Cryotherapy;Electrical Stimulation;Ultrasound;Moist Heat;Iontophoresis 4mg /ml Dexamethasone;Functional mobility training;Therapeutic activities;Therapeutic exercise;Patient/family education;Manual techniques;Passive range of motion;Vasopneumatic Device;Taping;Dry needling    PT Next Visit Plan  progress posture exercises;  assess goals - pt may go on hold after next visit. FOTO.   PT Home Exercise Plan  Access Code: [email or web address]    Consulted and Agree with Plan of Care  Patient       Patient will benefit from skilled therapeutic intervention in order to improve the following deficits and impairments:  Increased fascial restricitons, Increased muscle spasms, Pain, Impaired UE functional use, Decreased strength, Postural dysfunction  Visit Diagnosis: Abnormal posture  Chronic left shoulder pain  Muscle weakness (generalized)     Problem List Patient Active Problem List   Diagnosis Date Noted  . Genetic testing 07/06/2019  . Family history of breast cancer   . Primary osteoarthritis of left hip 05/13/2019  . 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  . Osteopenia 05/13/2019  . Peroneal tendinitis, left 02/16/2019  . Acute shoulder bursitis, left 02/16/2019  . 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 Fracture of fibula, left, closed 12/24/2018   Kerin Perna, PTA 09/11/19 10:15 AM  [place], [place], [address] Phone: [phone]   Fax:  [phone]  Name: Wendelin Frangipane MRN: [id number] Date of Birth: February 18, 1951

## 2019-09-16 ENCOUNTER — Encounter: Payer: Self-pay | Admitting: Sports Medicine

## 2019-09-16 ENCOUNTER — Other Ambulatory Visit: Payer: Self-pay

## 2019-09-16 ENCOUNTER — Ambulatory Visit (INDEPENDENT_AMBULATORY_CARE_PROVIDER_SITE_OTHER): Payer: Medicare Other | Admitting: Sports Medicine

## 2019-09-16 ENCOUNTER — Ambulatory Visit (INDEPENDENT_AMBULATORY_CARE_PROVIDER_SITE_OTHER): Payer: Medicare Other | Admitting: Physical Therapy

## 2019-09-16 DIAGNOSIS — M6281 Muscle weakness (generalized): Secondary | ICD-10-CM

## 2019-09-16 DIAGNOSIS — G8929 Other chronic pain: Secondary | ICD-10-CM | POA: Diagnosis not present

## 2019-09-16 DIAGNOSIS — M25512 Pain in left shoulder: Secondary | ICD-10-CM

## 2019-09-16 DIAGNOSIS — M1612 Unilateral primary osteoarthritis, left hip: Secondary | ICD-10-CM | POA: Diagnosis not present

## 2019-09-16 DIAGNOSIS — M7552 Bursitis of left shoulder: Secondary | ICD-10-CM | POA: Diagnosis not present

## 2019-09-16 DIAGNOSIS — R293 Abnormal posture: Secondary | ICD-10-CM

## 2019-09-16 NOTE — Assessment & Plan Note (Signed)
Noted bicipital tendinitis and dislocation out of the groove as well as rotator cuff tendinopathy on MRI. Fortunately symptoms have all resolved after subacromial injection and physical therapy, return as needed.

## 2019-09-16 NOTE — Therapy (Addendum)
[place], [place], [address] Phone: [phone]   Fax:  [phone]  Physical Therapy Treatment/Discharge Summary  Patient Details  Name: Donna Ramsey MRN: [id number] Date of Birth: 1951/08/24 Referring Provider (PT): Silverio Decamp, MD   Encounter Date: 09/16/2019  PT End of Session - 09/16/19 1131    Visit Number  6    Number of Visits  12    Date for PT Re-Evaluation  10/09/19    PT Start Time  1101    PT Stop Time  4270    PT Time Calculation (min)  30 min       Past Medical History:  Diagnosis Date  . Family history of breast cancer   . Splenic infarct     Past Surgical History:  Procedure Laterality Date  . ABDOMINAL HYSTERECTOMY    . FOOT SURGERY    . SPLENECTOMY, TOTAL      There were no vitals filed for this visit.  Subjective Assessment - 09/16/19 1137    Subjective  Pt reports she went to MD and she is pleased with progress.  Wants to know what exercises to do and when.    Diagnostic tests  MRI: Rotator cuff tendinopathy, biceps tendinitis and displacement    Patient Stated Goals  improve shoulder pain, get shoulder to heal    Currently in Pain?  No/denies    Pain Score  0-No pain         [facility] PT Assessment - 09/16/19 0001      Assessment   Medical Diagnosis  M75.52 (ICD-10-CM) - Acute shoulder bursitis, left    Referring Provider (PT)  Silverio Decamp, MD    Next MD Visit  not scheduled yet.       Observation/Other Assessments   Focus on Therapeutic Outcomes (FOTO)   35% limitation      Strength   Right/Left Shoulder  Left    Left Shoulder Flexion  5/5    Left Shoulder Extension  5/5    Left Shoulder ABduction  4+/5    Left Shoulder Internal Rotation  5/5    Left Shoulder External Rotation  --   5-/5       [facility] Adult PT Treatment/Exercise - 09/16/19 0001      Shoulder Exercises: Standing   External Rotation  Both;15 reps;Theraband     Theraband Level (Shoulder External Rotation)  Level 2 (Red)   5 sec pause in retraction   Flexion  Strengthening;Right;Left;10 reps;Weights    Theraband Level (Shoulder Flexion)  Level 2 (Red)    ABduction  Strengthening;Right;Left;10 reps;Theraband    Theraband Level (Shoulder ABduction)  Level 2 (Red)    Extension  Strengthening;Both;10 reps    Theraband Level (Shoulder Extension)  Level 2 (Red)    Row  10 reps;Theraband    Theraband Level (Shoulder Row)  Level 2 (Red)   5 sec hold in retraction   Diagonals  Strengthening;Right;Left;10 reps    Theraband Level (Shoulder Diagonals)  Level 2 (Red)      Shoulder Exercises: ROM/Strengthening   UBE (Upper Arm Bike)  L2 x 3 min (1.5' each direction)      Shoulder Exercises: Stretch   Other Shoulder Stretches  3 position stretch 20 sec x 2 reps each position; overhead stretch bilat x 20 sec        PT Long Term Goals - 09/16/19 1116      PT LONG TERM GOAL #1  Title  independent with HEP    Status  On-going      PT LONG TERM GOAL #2   Title  FOTO score improved to </= 31% limited for improved function    Status  Achieved      PT LONG TERM GOAL #3   Title  demonstrate at least 4/5 Lt shoulder strength for improved function    Status  Achieved      PT LONG TERM GOAL #4   Title  report pain < 3/10 with lifting activities for improved function    Status  Achieved            Plan - 09/16/19 1134    Clinical Impression Statement  Pt demonstrated improved Lt shoulder strength and had improved FOTO score. Pt tolerated all exercises well. Reviewed HEP. Pt verbalized desire to hold therapy while she continues HEP.  Pt has met most of her goals.    Personal Factors and Comorbidities  Time since onset of injury/illness/exacerbation;Comorbidity 2    Comorbidities  OA, multiple tendinopathies    Examination-Activity Limitations  Lift;Reach Overhead    Examination-Participation Restrictions  Yard Work    Stability/Clinical Decision  Making  Stable/Uncomplicated    Rehab Potential  Good    PT Frequency  2x / week    PT Duration  6 weeks    PT Treatment/Interventions  ADLs/Self Care Home Management;Cryotherapy;Electrical Stimulation;Ultrasound;Moist Heat;Iontophoresis 67m/ml Dexamethasone;Functional mobility training;Therapeutic activities;Therapeutic exercise;Patient/family education;Manual techniques;Passive range of motion;Vasopneumatic Device;Taping;Dry needling    PT Next Visit Plan  will hold until 10/30 per pt request.    PT Home Exercise Plan  Access Code: [email or web address]   Consulted and Agree with Plan of Care  Patient       Patient will benefit from skilled therapeutic intervention in order to improve the following deficits and impairments:  Increased fascial restricitons, Increased muscle spasms, Pain, Impaired UE functional use, Decreased strength, Postural dysfunction  Visit Diagnosis: Abnormal posture  Chronic left shoulder pain  Muscle weakness (generalized)     Problem List Patient Active Problem List   Diagnosis Date Noted  . Genetic testing 07/06/2019  . Family history of breast cancer   . Primary osteoarthritis of left hip 05/13/2019  . 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  . Osteopenia 05/13/2019  . Peroneal tendinitis, left 02/16/2019  . Acute shoulder bursitis, left 02/16/2019  . 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 Fracture of fibula, left, closed 12/24/2018   JKerin Perna PTA 09/16/19 11:40 AM  [place] [address]Phone: [phone]  Fax:  [phone] Name: Donna KuharMRN: [id number] of Birth: 911-27-1952    PHYSICAL THERAPY DISCHARGE SUMMARY  Visits from Start of Care: 6  Current functional level related to goals / functional outcomes: See above   Remaining deficits: See above   Education / Equipment: HEP   Plan: Patient agrees to discharge.  Patient goals were partially met. Patient is being discharged due to meeting the stated rehab goals.  ?????     SLaureen Abrahams PT, DPT 12/23/19 10:54 AM  [facility] [address] [address] [phone](office) [phone](fax)

## 2019-09-16 NOTE — Patient Instructions (Signed)
Access Code: [email or web address]  URL: [email or web address]  Date: 09/16/2019  Prepared by: Kerin Perna   Exercises  Added Standing Shoulder Single Arm PNF D2 Flexion with Resistance - 10 reps - 2 sets - 1x daily - 3x weekly

## 2019-09-16 NOTE — Assessment & Plan Note (Signed)
We did a hip joint injection back in May 2020, she has continued to do well, she has a bit of discomfort here and there with heavy work and occasional clicking. Should this return to the point where she feels as though she cannot live with it we would proceed with an MR arthrogram of the hip rather than just a steroid injection.

## 2019-09-16 NOTE — Progress Notes (Signed)
Subjective:    CC: Follow-up  HPI: Left shoulder pain: Resolved after injection and therapy.  I reviewed the past medical history, family history, social history, surgical history, and allergies today and no changes were needed.  Please see the problem list section below in epic for further details.  Past Medical History: Past Medical History:  Diagnosis Date  . Family history of breast cancer   . Splenic infarct    Past Surgical History: Past Surgical History:  Procedure Laterality Date  . ABDOMINAL HYSTERECTOMY    . FOOT SURGERY    . SPLENECTOMY, TOTAL     Social History: Social History   Socioeconomic History  . Marital status: Married    Spouse name: Not on file  . Number of children: Not on file  . Years of education: Not on file  . Highest education level: Not on file  Occupational History  . Not on file  Social Needs  . Financial resource strain: Not on file  . Food insecurity    Worry: Not on file    Inability: Not on file  . Transportation needs    Medical: Not on file    Non-medical: Not on file  Tobacco Use  . Smoking status: Never Smoker  . Smokeless tobacco: Never Used  Substance and Sexual Activity  . Alcohol use: Yes    Comment: occ  . Drug use: Never  . Sexual activity: Never  Lifestyle  . Physical activity    Days per week: Not on file    Minutes per session: Not on file  . Stress: Not on file  Relationships  . Social Herbalist on phone: Not on file    Gets together: Not on file    Attends religious service: Not on file    Active member of club or organization: Not on file    Attends meetings of clubs or organizations: Not on file    Relationship status: Not on file  Other Topics Concern  . Not on file  Social History Narrative  . Not on file   Family History: Family History  Problem Relation Age of Onset  . Breast cancer Mother 4  . Breast cancer Paternal Aunt   . Breast cancer Paternal Grandmother        dx 19s    Allergies: Allergies  Allergen Reactions  . Cephalosporins Hives  . Codeine Hives and Rash  . Penicillins Hives, Other (See Comments) and Rash    Hives   . Levofloxacin Rash and Other (See Comments)    tendonitis tendonitis unknown unknown   . Sulfa Antibiotics Rash   Medications: See med rec.  Review of Systems: No fevers, chills, night sweats, weight loss, chest pain, or shortness of breath.   Objective:    General: Well Developed, well nourished, and in no acute distress.  Neuro: Alert and oriented x3, extra-ocular muscles intact, sensation grossly intact.  HEENT: Normocephalic, atraumatic, pupils equal round reactive to light, neck supple, no masses, no lymphadenopathy, thyroid nonpalpable.  Skin: Warm and dry, no rashes. Cardiac: Regular rate and rhythm, no murmurs rubs or gallops, no lower extremity edema.  Respiratory: Clear to auscultation bilaterally. Not using accessory muscles, speaking in full sentences.  Impression and Recommendations:    Acute shoulder bursitis, left Noted bicipital tendinitis and dislocation out of the groove as well as rotator cuff tendinopathy on MRI. Fortunately symptoms have all resolved after subacromial injection and physical therapy, return as needed.  Primary osteoarthritis of  left hip We did a hip joint injection back in May 2020, she has continued to do well, she has a bit of discomfort here and there with heavy work and occasional clicking. Should this return to the point where she feels as though she cannot live with it we would proceed with an MR arthrogram of the hip rather than just a steroid injection.   ___________________________________________ Gwen Her. Dianah Field, M.D., ABFM., CAQSM. Primary Care and Sports Medicine [facility]  Adjunct Professor of [employer]

## 2019-09-28 ENCOUNTER — Encounter: Payer: Self-pay | Admitting: Sports Medicine

## 2019-09-28 DIAGNOSIS — M545 Low back pain, unspecified: Secondary | ICD-10-CM

## 2019-09-28 DIAGNOSIS — M47816 Spondylosis without myelopathy or radiculopathy, lumbar region: Secondary | ICD-10-CM | POA: Insufficient documentation

## 2019-09-28 MED ORDER — METHOCARBAMOL 500 MG PO TABS: 500.0000 mg | ORAL_TABLET | Freq: Three times a day (TID) | ORAL | 0 refills | Status: DC

## 2019-09-28 MED ORDER — PREDNISONE 50 MG PO TABS: ORAL_TABLET | ORAL | 0 refills | Status: DC

## 2019-10-20 DIAGNOSIS — L400 Psoriasis vulgaris: Secondary | ICD-10-CM | POA: Diagnosis not present

## 2019-10-20 DIAGNOSIS — D1801 Hemangioma of skin and subcutaneous tissue: Secondary | ICD-10-CM | POA: Diagnosis not present

## 2019-10-20 DIAGNOSIS — D224 Melanocytic nevi of scalp and neck: Secondary | ICD-10-CM | POA: Diagnosis not present

## 2019-10-20 DIAGNOSIS — L821 Other seborrheic keratosis: Secondary | ICD-10-CM | POA: Diagnosis not present

## 2019-10-28 DIAGNOSIS — Z20828 Contact with and (suspected) exposure to other viral communicable diseases: Secondary | ICD-10-CM | POA: Diagnosis not present

## 2019-10-28 DIAGNOSIS — J069 Acute upper respiratory infection, unspecified: Secondary | ICD-10-CM | POA: Diagnosis not present

## 2019-11-02 DIAGNOSIS — D472 Monoclonal gammopathy: Secondary | ICD-10-CM | POA: Diagnosis not present

## 2019-11-05 DIAGNOSIS — K766 Portal hypertension: Secondary | ICD-10-CM | POA: Diagnosis not present

## 2019-11-05 DIAGNOSIS — D472 Monoclonal gammopathy: Secondary | ICD-10-CM | POA: Diagnosis not present

## 2019-11-05 DIAGNOSIS — D704 Cyclic neutropenia: Secondary | ICD-10-CM | POA: Diagnosis not present

## 2019-11-24 DIAGNOSIS — N644 Mastodynia: Secondary | ICD-10-CM | POA: Diagnosis not present

## 2020-01-16 ENCOUNTER — Ambulatory Visit: Payer: Medicare Other

## 2020-01-18 ENCOUNTER — Ambulatory Visit: Payer: Medicare Other

## 2020-01-21 ENCOUNTER — Ambulatory Visit: Payer: Medicare Other

## 2020-02-23 DIAGNOSIS — L821 Other seborrheic keratosis: Secondary | ICD-10-CM | POA: Diagnosis not present

## 2020-02-23 DIAGNOSIS — L820 Inflamed seborrheic keratosis: Secondary | ICD-10-CM | POA: Diagnosis not present

## 2020-02-23 DIAGNOSIS — L308 Other specified dermatitis: Secondary | ICD-10-CM | POA: Diagnosis not present

## 2020-02-23 DIAGNOSIS — D485 Neoplasm of uncertain behavior of skin: Secondary | ICD-10-CM | POA: Diagnosis not present

## 2020-03-11 DIAGNOSIS — L659 Nonscarring hair loss, unspecified: Secondary | ICD-10-CM | POA: Diagnosis not present

## 2020-03-31 DIAGNOSIS — Z20822 Contact with and (suspected) exposure to covid-19: Secondary | ICD-10-CM | POA: Diagnosis not present

## 2020-03-31 DIAGNOSIS — K766 Portal hypertension: Secondary | ICD-10-CM | POA: Diagnosis not present

## 2020-03-31 DIAGNOSIS — Z01812 Encounter for preprocedural laboratory examination: Secondary | ICD-10-CM | POA: Diagnosis not present

## 2020-04-05 DIAGNOSIS — Z8719 Personal history of other diseases of the digestive system: Secondary | ICD-10-CM | POA: Diagnosis not present

## 2020-04-05 DIAGNOSIS — K766 Portal hypertension: Secondary | ICD-10-CM | POA: Diagnosis not present

## 2020-04-05 DIAGNOSIS — Z13810 Encounter for screening for upper gastrointestinal disorder: Secondary | ICD-10-CM | POA: Diagnosis not present

## 2020-04-05 DIAGNOSIS — I8500 Esophageal varices without bleeding: Secondary | ICD-10-CM | POA: Diagnosis not present

## 2020-04-07 DIAGNOSIS — K7469 Other cirrhosis of liver: Secondary | ICD-10-CM | POA: Diagnosis not present

## 2020-04-07 DIAGNOSIS — K7460 Unspecified cirrhosis of liver: Secondary | ICD-10-CM | POA: Diagnosis not present

## 2020-04-11 ENCOUNTER — Other Ambulatory Visit: Payer: Self-pay

## 2020-04-11 ENCOUNTER — Ambulatory Visit (INDEPENDENT_AMBULATORY_CARE_PROVIDER_SITE_OTHER): Payer: Medicare Other | Admitting: Sports Medicine

## 2020-04-11 ENCOUNTER — Ambulatory Visit (INDEPENDENT_AMBULATORY_CARE_PROVIDER_SITE_OTHER): Payer: Medicare Other

## 2020-04-11 ENCOUNTER — Encounter: Payer: Self-pay | Admitting: Sports Medicine

## 2020-04-11 DIAGNOSIS — M7552 Bursitis of left shoulder: Secondary | ICD-10-CM

## 2020-04-11 DIAGNOSIS — M79672 Pain in left foot: Secondary | ICD-10-CM

## 2020-04-11 DIAGNOSIS — M19072 Primary osteoarthritis, left ankle and foot: Secondary | ICD-10-CM | POA: Diagnosis not present

## 2020-04-11 IMAGING — DX DG FOOT COMPLETE 3+V*L*
3 series · 3 of 3 positions shown · non-contrast
Comparison: [DATE]

CLINICAL DATA: Tarsometatarsal pain worsening recently

EXAM:
LEFT FOOT - COMPLETE 3+ VIEW

[foot ap]
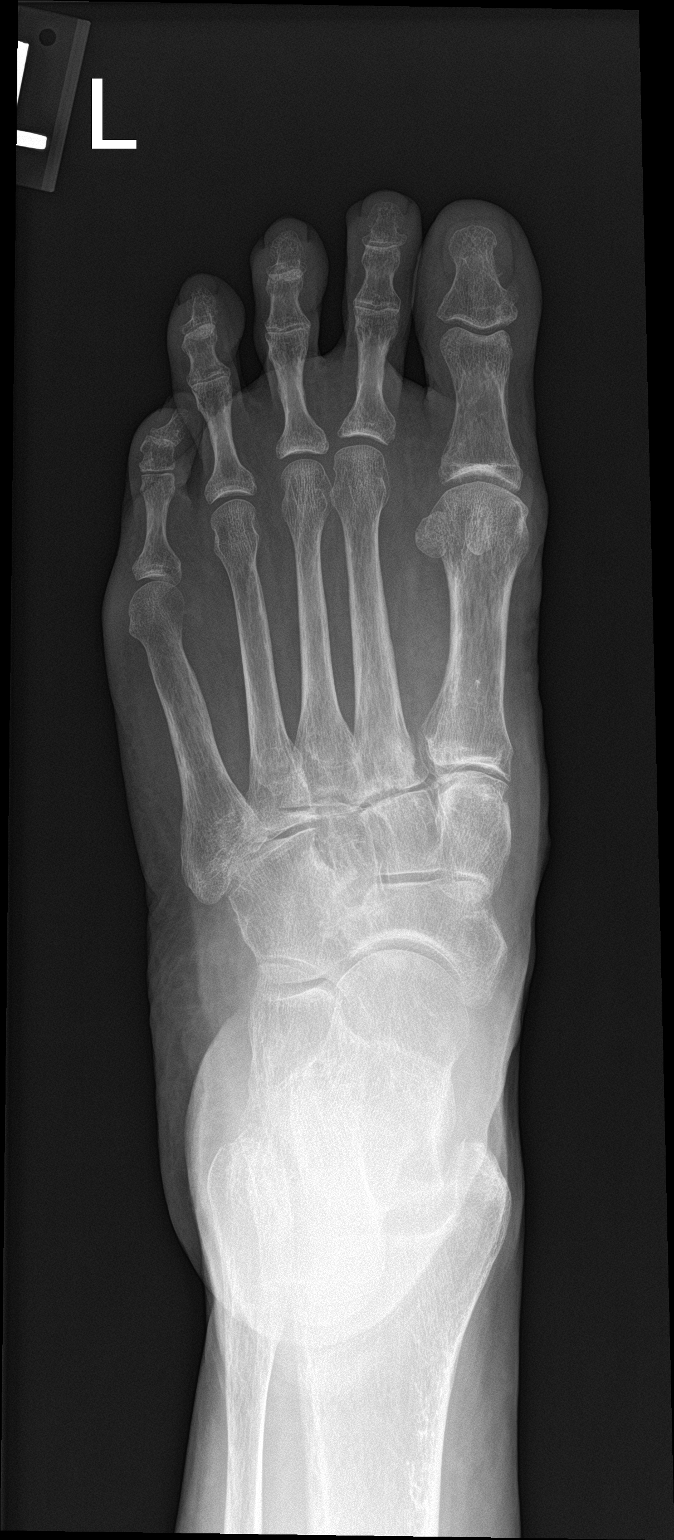

[foot obl]
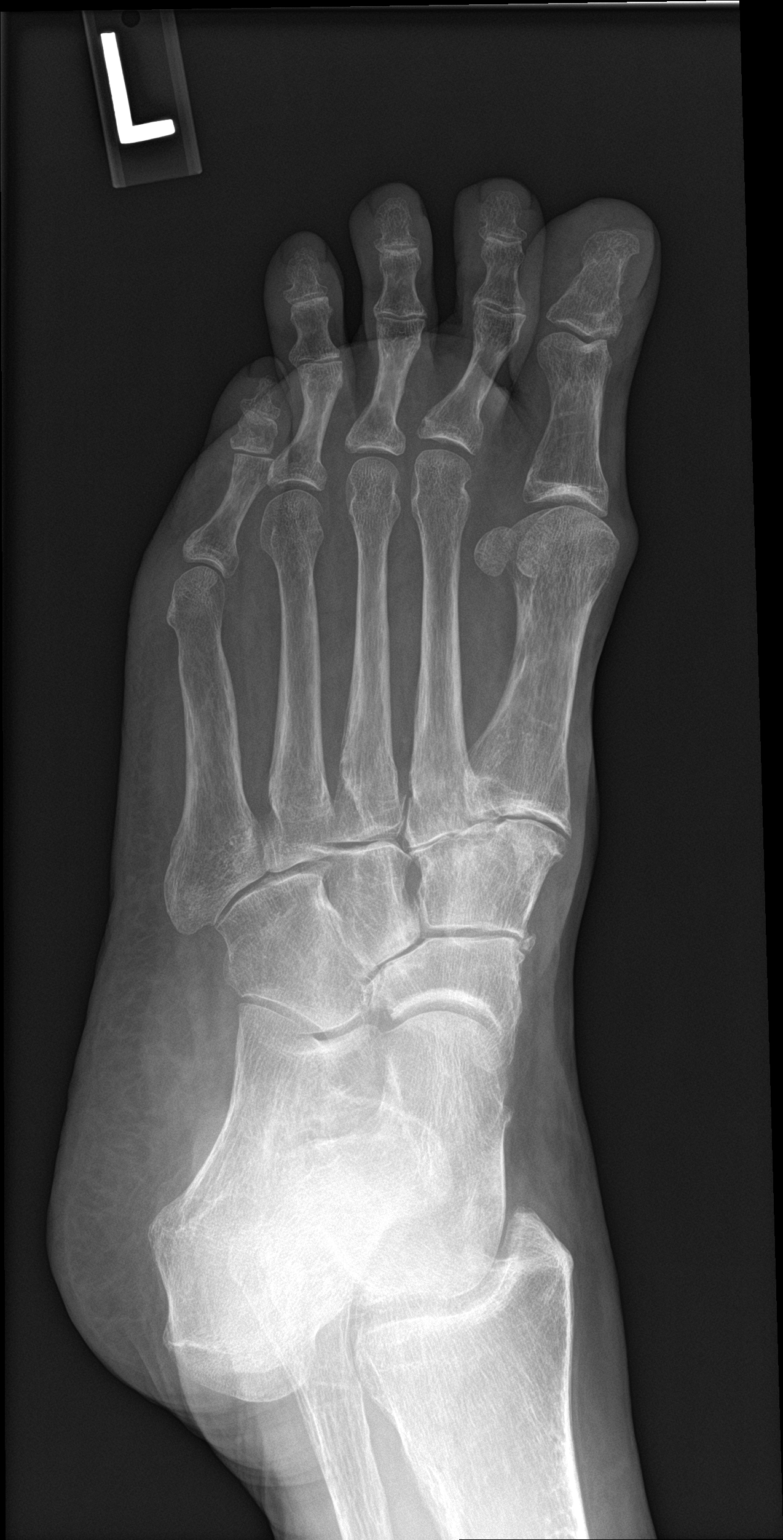

[foot lat]
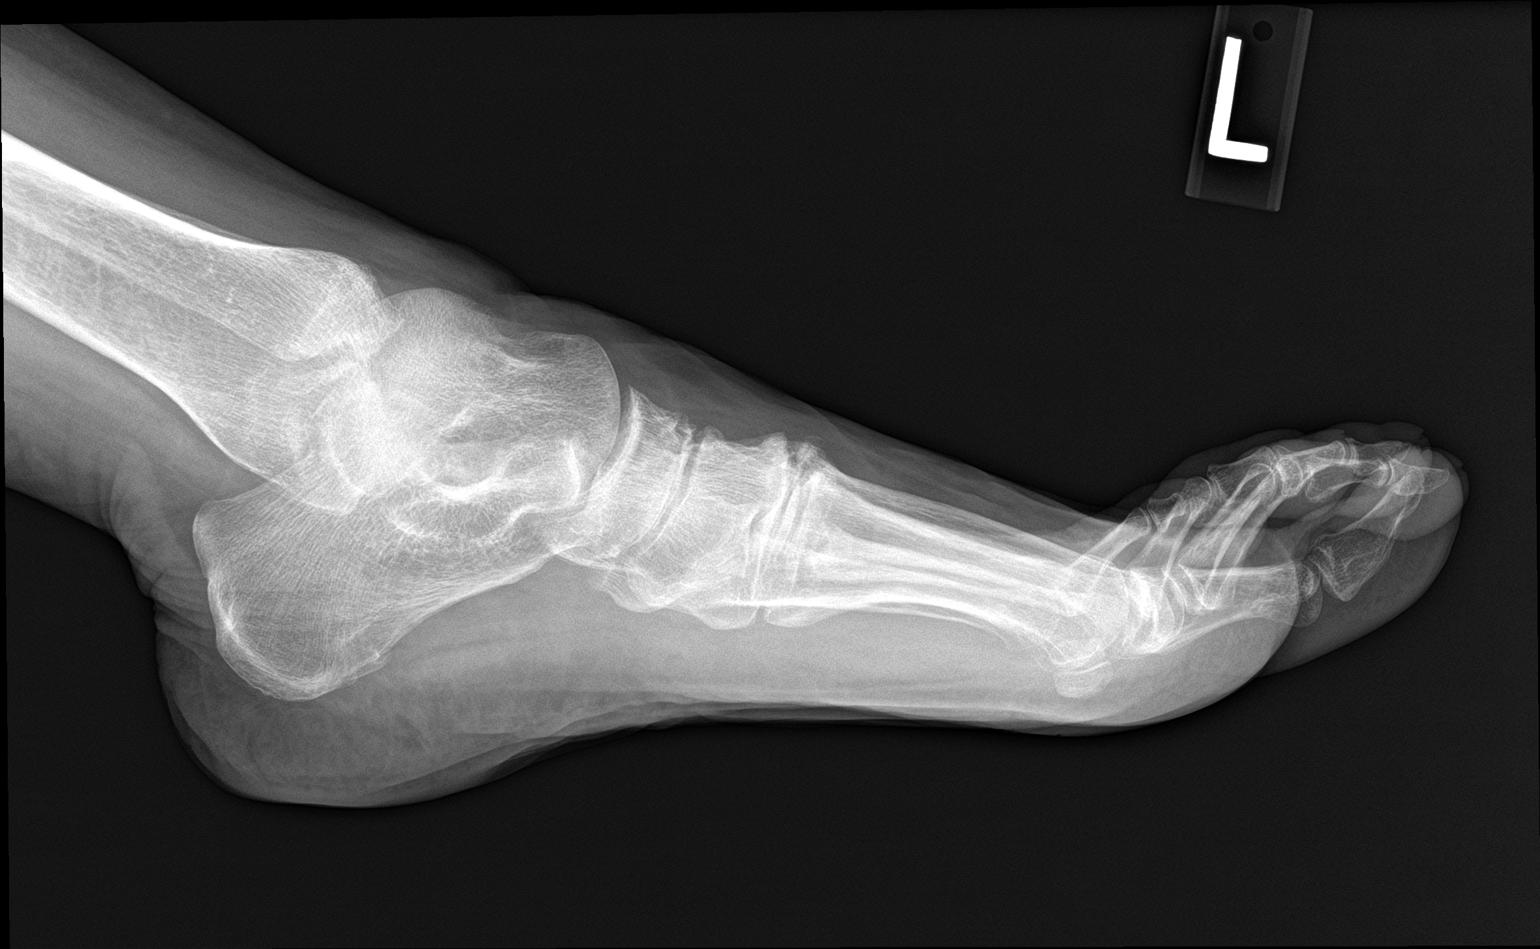

[3 of 3 positions shown; findings below may reference images not displayed]

FINDINGS: Frontal, oblique, and lateral views of the left foot demonstrate no
fractures. There is joint space narrowing and osteophyte formation
throughout the midfoot, greatest at the tarsometatarsal joints. This
is a stable finding. Soft tissues are unremarkable.
IMPRESSION: 1. Prominent midfoot osteoarthritis, stable.
2. No acute bony abnormality.

## 2020-04-11 NOTE — Assessment & Plan Note (Signed)
This pleasant 69 year old female returns, she has a known left shoulder bursitis, we injected it back in August of last year. She had some physical therapy and did extremely well. She does have an MRI on file. Unfortunately she is having recurrence of pain over the deltoid, worse with abduction and waking her from sleep. She has meloxicam to take as needed. We are going to start again with conservative treatment with some rehab exercises. Return to see me in 4 weeks, injection if no better.

## 2020-04-11 NOTE — Assessment & Plan Note (Signed)
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 She can use her meloxicam, getting foot x-rays, referral to Dr. Raeford Razor for custom molded orthotics, return to see me in 4 weeks for this, midfoot injection if no better.

## 2020-04-11 NOTE — Progress Notes (Signed)
    Procedures performed today:    None.  Independent interpretation of notes and tests performed by another provider:   None.  Brief History, Exam, Impression, and Recommendations:    Acute shoulder bursitis, left This pleasant 69 year old female returns, she has a known left shoulder bursitis, we injected it back in August of last year. She had some physical therapy and did extremely well. She does have an MRI on file. Unfortunately she is having recurrence of pain over the deltoid, worse with abduction and waking her from sleep. She has meloxicam to take as needed. We are going to start again with conservative treatment with some rehab exercises. Return to see me in 4 weeks, injection if no better.  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 She can use her meloxicam, getting foot x-rays, referral to Dr. Raeford Razor for custom molded orthotics, return to see me in 4 weeks for this, midfoot injection if no better.    ___________________________________________ Gwen Her. Dianah Field, M.D., ABFM., CAQSM. Primary Care and [place] Instructor of [place] of [facility] of Medicine

## 2020-04-15 ENCOUNTER — Ambulatory Visit (INDEPENDENT_AMBULATORY_CARE_PROVIDER_SITE_OTHER): Payer: Medicare Other | Admitting: Family Medicine

## 2020-04-15 ENCOUNTER — Other Ambulatory Visit: Payer: Self-pay

## 2020-04-15 ENCOUNTER — Encounter: Payer: Self-pay | Admitting: Family Medicine

## 2020-04-15 DIAGNOSIS — M79672 Pain in left foot: Secondary | ICD-10-CM | POA: Diagnosis not present

## 2020-04-15 NOTE — Progress Notes (Signed)
Donna Ramsey - 69 y.o. female MRN [id number]  Date of birth: 1951/03/25  SUBJECTIVE:  Including CC & ROS.  Chief Complaint  Patient presents with  . Foot Orthotics    Donna Ramsey is a 69 y.o. female that is presenting with bilateral foot pain.  The left is worse than the right.  The pain is worse with walking.  She feels the pain over the dorsum of the midfoot..   Review of Systems See HPI   HISTORY: Past Medical, Surgical, Social, and Family History Reviewed & Updated per EMR.   Pertinent Historical Findings include:  Past Medical History:  Diagnosis Date  . Family history of breast cancer   . Splenic infarct     Past Surgical History:  Procedure Laterality Date  . ABDOMINAL HYSTERECTOMY    . FOOT SURGERY    . SPLENECTOMY, TOTAL      Family History  Problem Relation Age of Onset  . Breast cancer Mother 55  . Breast cancer Paternal Aunt   . Breast cancer Paternal Grandmother        dx 11s    Social History   Socioeconomic History  . Marital status: Married    Spouse name: Not on file  . Number of children: Not on file  . Years of education: Not on file  . Highest education level: Not on file  Occupational History  . Not on file  Tobacco Use  . Smoking status: Never Smoker  . Smokeless tobacco: Never Used  Substance and Sexual Activity  . Alcohol use: Yes    Comment: occ  . Drug use: Never  . Sexual activity: Never  Other Topics Concern  . Not on file  Social History Narrative  . Not on file   Social Determinants of Health   Financial Resource Strain:   . Difficulty of Paying Living Expenses:   Food Insecurity:   . Worried About Charity fundraiser in the Last Year:   . Arboriculturist in the Last Year:   Transportation Needs:   . Film/video editor (Medical):   Marland Kitchen Lack of Transportation (Non-Medical):   Physical Activity:   . Days of Exercise per Week:   . Minutes of Exercise per Session:   Stress:   . Feeling of Stress :   Social  Connections:   . Frequency of Communication with Friends and Family:   . Frequency of Social Gatherings with Friends and Family:   . Attends Religious Services:   . Active Member of Clubs or Organizations:   . Attends Archivist Meetings:   Marland Kitchen Marital Status:   Intimate Partner Violence:   . Fear of Current or Ex-Partner:   . Emotionally Abused:   Marland Kitchen Physically Abused:   . Sexually Abused:      PHYSICAL EXAM:  VS: Ht 5\' 10"  (1.778 m)   Wt 185 lb (83.9 kg)   BMI 26.54 kg/m  Physical Exam Gen: NAD, alert, cooperative with exam, well-appearing MSK:  Right and left foot: Significant loss of the transverse arch. Has hammertoe in present. Bunionette worse on the right than the left. Midfoot tarsometatarsal bossing. Neurovascular intact  Patient was fitted for a standard, cushioned, semi-rigid orthotic. The orthotic was heated and afterward the patient stood on the orthotic blank positioned on the orthotic stand. The patient was positioned in subtalar neutral position and 10 degrees of ankle dorsiflexion in a weight bearing stance. After completion of molding, a stable base was applied to  the orthotic blank. The blank was ground to a stable position for weight bearing. Size: M10 Pairs: 2 Base: Blue EVA Additional Posting and Padding: None The patient ambulated these, and they were very comfortable.   ASSESSMENT & PLAN:   Left foot pain Has significant loss of the transverse arch bilaterally. -Orthotics. -Could consider adding metatarsal pads and scaphoid pads to help with structural changes.

## 2020-04-15 NOTE — Assessment & Plan Note (Signed)
Has significant loss of the transverse arch bilaterally. -Orthotics. -Could consider adding metatarsal pads and scaphoid pads to help with structural changes.

## 2020-05-02 DIAGNOSIS — R635 Abnormal weight gain: Secondary | ICD-10-CM | POA: Diagnosis not present

## 2020-05-02 DIAGNOSIS — J4520 Mild intermittent asthma, uncomplicated: Secondary | ICD-10-CM | POA: Diagnosis not present

## 2020-05-02 DIAGNOSIS — Q8901 Asplenia (congenital): Secondary | ICD-10-CM | POA: Diagnosis not present

## 2020-05-02 DIAGNOSIS — K766 Portal hypertension: Secondary | ICD-10-CM | POA: Diagnosis not present

## 2020-05-02 DIAGNOSIS — Z79899 Other long term (current) drug therapy: Secondary | ICD-10-CM | POA: Diagnosis not present

## 2020-05-02 DIAGNOSIS — E559 Vitamin D deficiency, unspecified: Secondary | ICD-10-CM | POA: Diagnosis not present

## 2020-05-02 DIAGNOSIS — D472 Monoclonal gammopathy: Secondary | ICD-10-CM | POA: Diagnosis not present

## 2020-05-03 DIAGNOSIS — K219 Gastro-esophageal reflux disease without esophagitis: Secondary | ICD-10-CM | POA: Diagnosis not present

## 2020-05-03 DIAGNOSIS — D472 Monoclonal gammopathy: Secondary | ICD-10-CM | POA: Diagnosis not present

## 2020-05-03 DIAGNOSIS — M67912 Unspecified disorder of synovium and tendon, left shoulder: Secondary | ICD-10-CM | POA: Diagnosis not present

## 2020-05-03 DIAGNOSIS — M85859 Other specified disorders of bone density and structure, unspecified thigh: Secondary | ICD-10-CM | POA: Diagnosis not present

## 2020-05-03 DIAGNOSIS — G479 Sleep disorder, unspecified: Secondary | ICD-10-CM | POA: Diagnosis not present

## 2020-05-03 DIAGNOSIS — K766 Portal hypertension: Secondary | ICD-10-CM | POA: Diagnosis not present

## 2020-05-03 DIAGNOSIS — Z0000 Encounter for general adult medical examination without abnormal findings: Secondary | ICD-10-CM | POA: Diagnosis not present

## 2020-05-03 DIAGNOSIS — J4520 Mild intermittent asthma, uncomplicated: Secondary | ICD-10-CM | POA: Diagnosis not present

## 2020-05-03 DIAGNOSIS — Q8901 Asplenia (congenital): Secondary | ICD-10-CM | POA: Diagnosis not present

## 2020-05-05 DIAGNOSIS — H35373 Puckering of macula, bilateral: Secondary | ICD-10-CM | POA: Diagnosis not present

## 2020-05-05 DIAGNOSIS — H2513 Age-related nuclear cataract, bilateral: Secondary | ICD-10-CM | POA: Diagnosis not present

## 2020-05-05 DIAGNOSIS — H0288B Meibomian gland dysfunction left eye, upper and lower eyelids: Secondary | ICD-10-CM | POA: Diagnosis not present

## 2020-05-05 DIAGNOSIS — H43813 Vitreous degeneration, bilateral: Secondary | ICD-10-CM | POA: Diagnosis not present

## 2020-05-05 DIAGNOSIS — Z83511 Family history of glaucoma: Secondary | ICD-10-CM | POA: Diagnosis not present

## 2020-05-05 DIAGNOSIS — H11123 Conjunctival concretions, bilateral: Secondary | ICD-10-CM | POA: Diagnosis not present

## 2020-05-05 DIAGNOSIS — H0288A Meibomian gland dysfunction right eye, upper and lower eyelids: Secondary | ICD-10-CM | POA: Diagnosis not present

## 2020-05-23 ENCOUNTER — Other Ambulatory Visit: Payer: Self-pay

## 2020-05-23 ENCOUNTER — Encounter: Payer: Self-pay | Admitting: Sports Medicine

## 2020-05-23 ENCOUNTER — Other Ambulatory Visit: Payer: Self-pay | Admitting: Surgery

## 2020-05-23 ENCOUNTER — Ambulatory Visit (INDEPENDENT_AMBULATORY_CARE_PROVIDER_SITE_OTHER): Payer: Medicare Other | Admitting: Sports Medicine

## 2020-05-23 DIAGNOSIS — M7552 Bursitis of left shoulder: Secondary | ICD-10-CM | POA: Diagnosis not present

## 2020-05-23 DIAGNOSIS — M1712 Unilateral primary osteoarthritis, left knee: Secondary | ICD-10-CM

## 2020-05-23 DIAGNOSIS — N644 Mastodynia: Secondary | ICD-10-CM

## 2020-05-23 DIAGNOSIS — M79672 Pain in left foot: Secondary | ICD-10-CM | POA: Diagnosis not present

## 2020-05-23 NOTE — Assessment & Plan Note (Signed)
This is a pleasant 69 year old female, known left shoulder bursitis, worsening of symptoms. Today we injected her subacromial bursa, she will continue her rehab exercises and return to see me in a month.

## 2020-05-23 NOTE — Assessment & Plan Note (Signed)
Increasing knee pain and orthotics, today we injected the knee. She has posterior lateral joint line pain. At the return visit if not feeling much better we will obtain some imaging.

## 2020-05-23 NOTE — Assessment & Plan Note (Signed)
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 Holding off on injection today.

## 2020-05-23 NOTE — Progress Notes (Signed)
° ° °  Procedures performed today:    Procedure: Real-time Ultrasound Guided injection of the left subacromial bursa Device: Samsung HS60  Verbal informed consent obtained.  Time-out conducted.  Noted no overlying erythema, induration, or other signs of local infection.  Skin prepped in a sterile fashion.  Local anesthesia: Topical Ethyl chloride.  With sterile technique and under real time ultrasound guidance: 1 cc Kenalog 40, 1 cc lidocaine, 1 cc bupivacaine injected easily Completed without difficulty  Pain immediately resolved suggesting accurate placement of the medication.  Advised to call if fevers/chills, erythema, induration, drainage, or persistent bleeding.  Images permanently stored and available for review in the ultrasound unit.  Impression: Technically successful ultrasound guided injection.  Procedure: Real-time Ultrasound Guided injection of the Left knee Device: Samsung HS60  Verbal informed consent obtained.  Time-out conducted.  Noted no overlying erythema, induration, or other signs of local infection.  Skin prepped in a sterile fashion.  Local anesthesia: Topical Ethyl chloride.  With sterile technique and under real time ultrasound guidance: 1 cc Kenalog 40, 2 cc lidocaine, 2 cc bupivacaine injected easily Completed without difficulty  Pain immediately resolved suggesting accurate placement of the medication.  Advised to call if fevers/chills, erythema, induration, drainage, or persistent bleeding.  Images permanently stored and available for review in the ultrasound unit.  Impression: Technically successful ultrasound guided injection.  Independent interpretation of notes and tests performed by another provider:   None.  Brief History, Exam, Impression, and Recommendations:    Acute shoulder bursitis, left This is a pleasant 69 year old female, known left shoulder bursitis, worsening of symptoms. Today we injected her subacromial bursa, she will continue  her rehab exercises and return to see me in a month.  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 Holding off on injection today.  Primary osteoarthritis of left knee Increasing knee pain and orthotics, today we injected the knee. She has posterior lateral joint line pain. At the return visit if not feeling much better we will obtain some imaging.    ___________________________________________ Gwen Her. Dianah Field, M.D., ABFM., CAQSM. Primary Care and [place] Instructor of [place] of [facility] of Medicine

## 2020-05-24 DIAGNOSIS — N644 Mastodynia: Secondary | ICD-10-CM | POA: Diagnosis not present

## 2020-05-30 ENCOUNTER — Encounter: Payer: Self-pay | Admitting: Sports Medicine

## 2020-05-30 ENCOUNTER — Ambulatory Visit: Payer: Medicare Other

## 2020-05-30 ENCOUNTER — Ambulatory Visit (INDEPENDENT_AMBULATORY_CARE_PROVIDER_SITE_OTHER): Payer: Medicare Other | Admitting: Sports Medicine

## 2020-05-30 ENCOUNTER — Other Ambulatory Visit: Payer: Self-pay

## 2020-05-30 ENCOUNTER — Ambulatory Visit (INDEPENDENT_AMBULATORY_CARE_PROVIDER_SITE_OTHER): Payer: Medicare Other

## 2020-05-30 DIAGNOSIS — M25562 Pain in left knee: Secondary | ICD-10-CM | POA: Diagnosis not present

## 2020-05-30 DIAGNOSIS — M1712 Unilateral primary osteoarthritis, left knee: Secondary | ICD-10-CM | POA: Diagnosis not present

## 2020-05-30 DIAGNOSIS — M7122 Synovial cyst of popliteal space [Baker], left knee: Secondary | ICD-10-CM | POA: Insufficient documentation

## 2020-05-30 DIAGNOSIS — M79662 Pain in left lower leg: Secondary | ICD-10-CM | POA: Diagnosis not present

## 2020-05-30 IMAGING — US US EXTREM LOW VENOUS*L*
1 series · 13 of 24 positions shown · non-contrast
Comparison: None

CLINICAL DATA: LEFT calf pain

EXAM:
LEFT LOWER EXTREMITY VENOUS DOPPLER ULTRASOUND
TECHNIQUE: Gray-scale sonography with compression, as well as color and duplex
ultrasound, were performed to evaluate the deep venous system(s)
from the level of the common femoral vein through the popliteal and
proximal calf veins.

[Series 1: us extrem low venous*left* · 0.07mm/px · 13 of 57 slices shown]
[im 1/57]
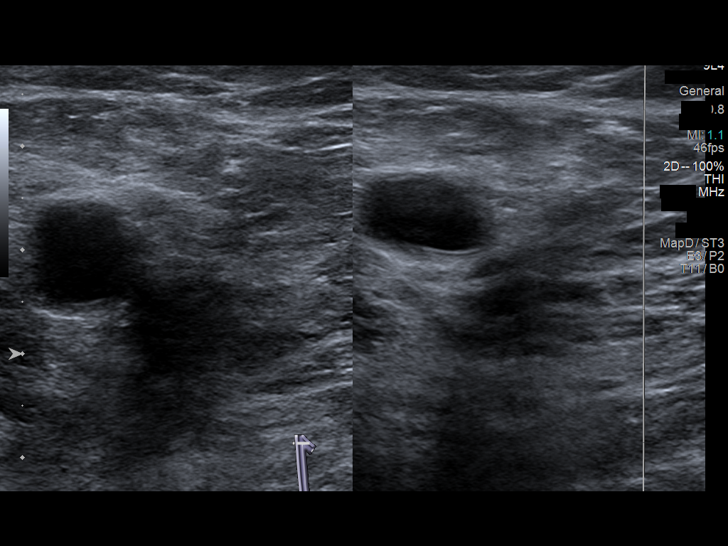
[im 5/57]
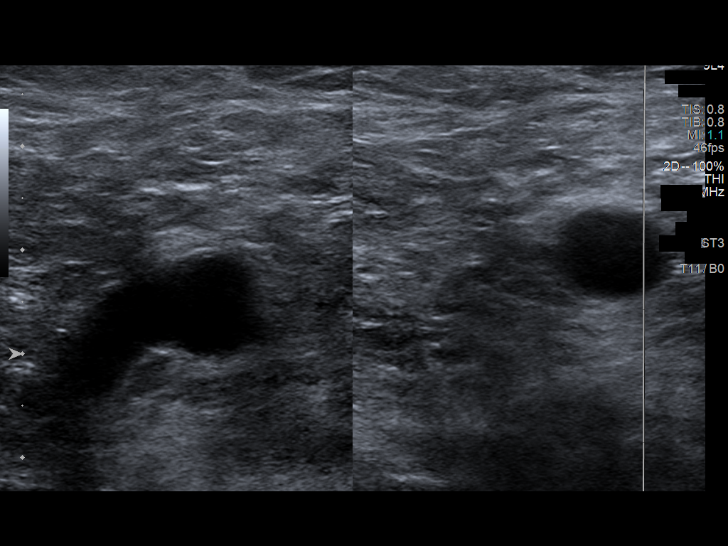
[im 10/57]
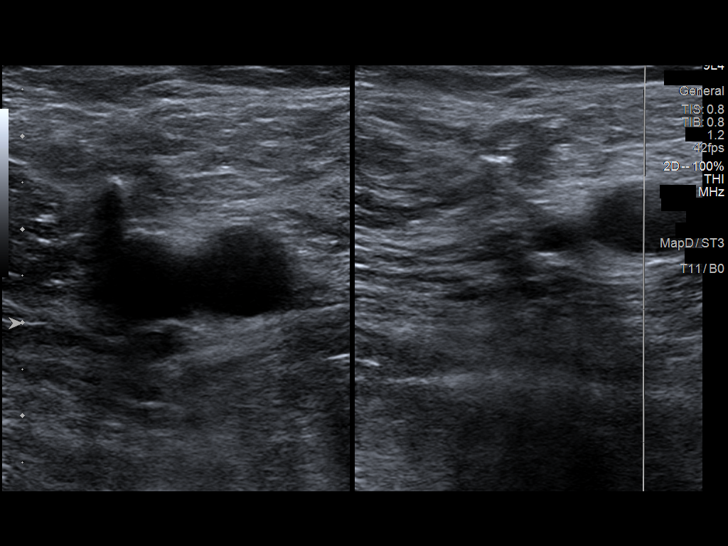
[im 15/57]
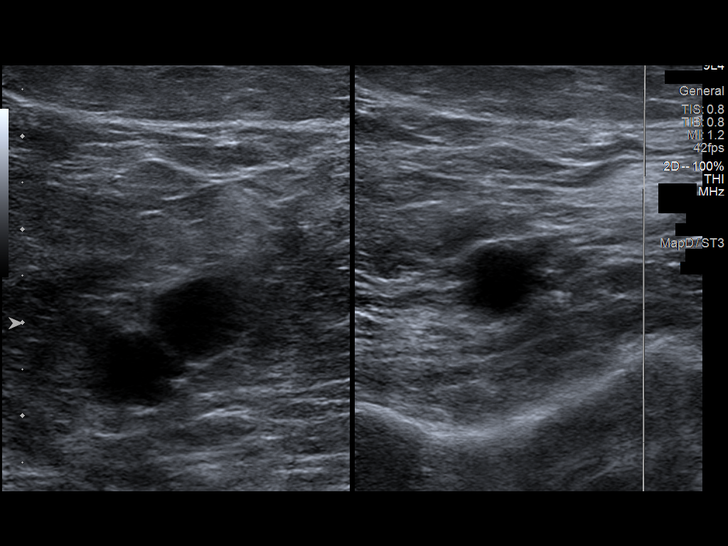
[im 20/57]
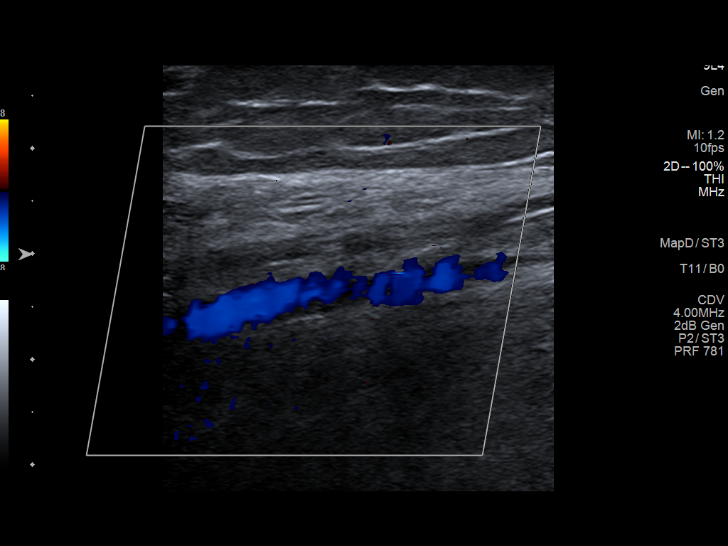
[im 25/57]
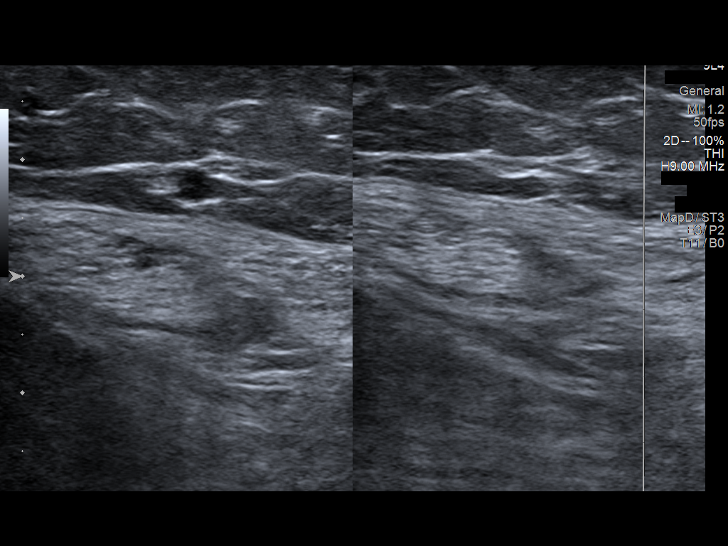
[im 30/57]
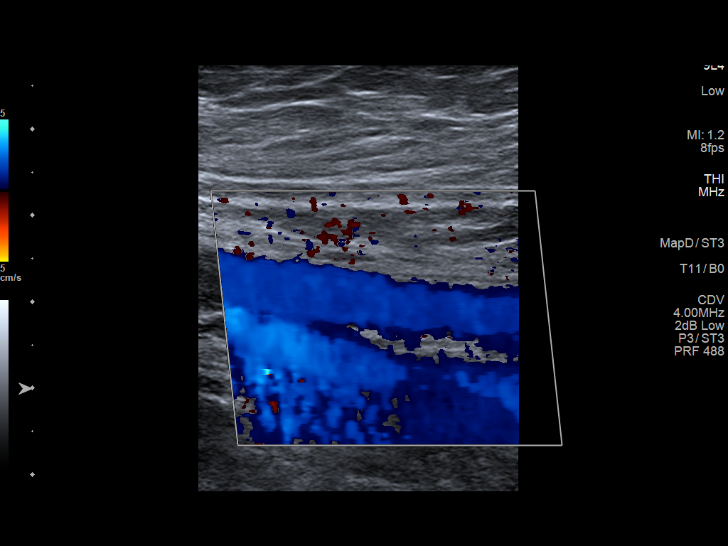
[im 32/57]
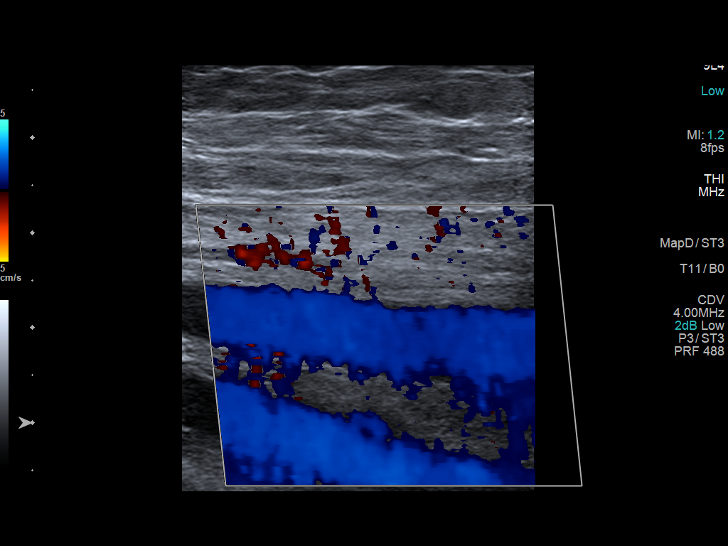
[im 37/57]
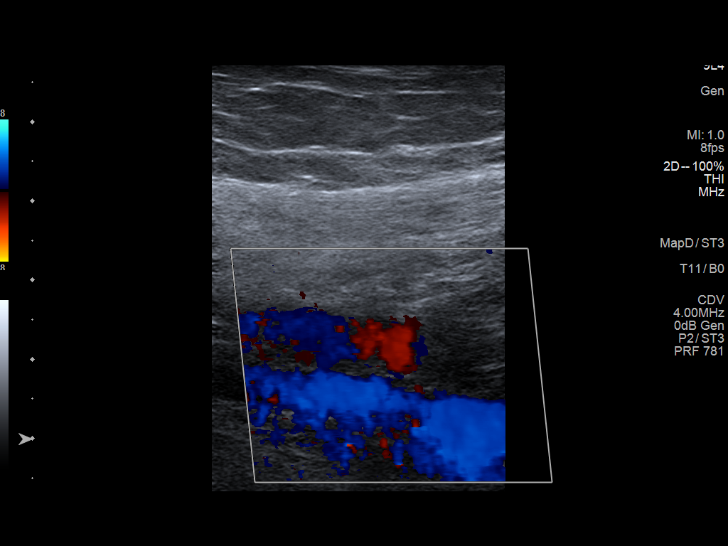
[im 42/57]
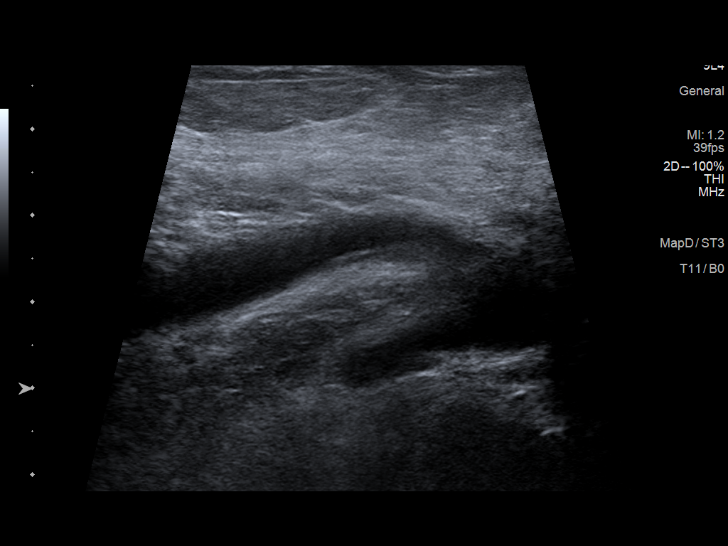
[im 47/57]
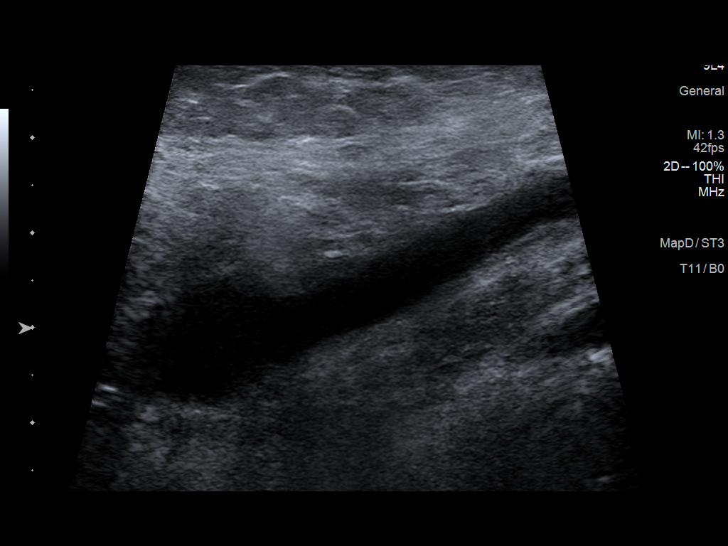
[im 52/57]
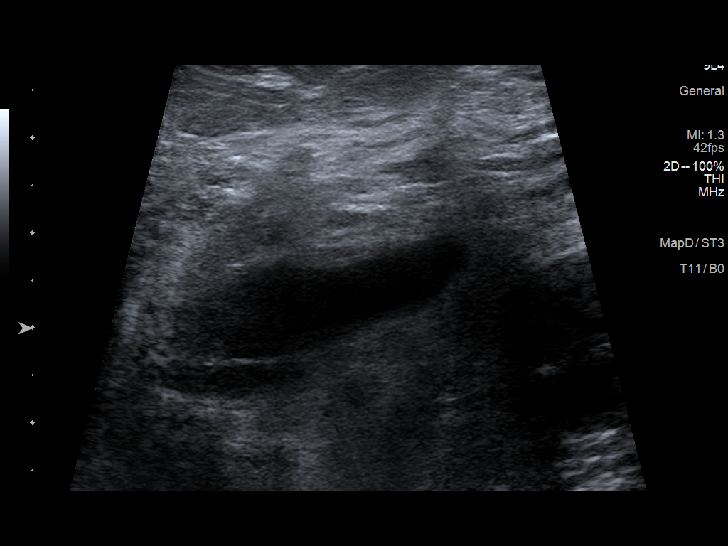
[im 57/57]
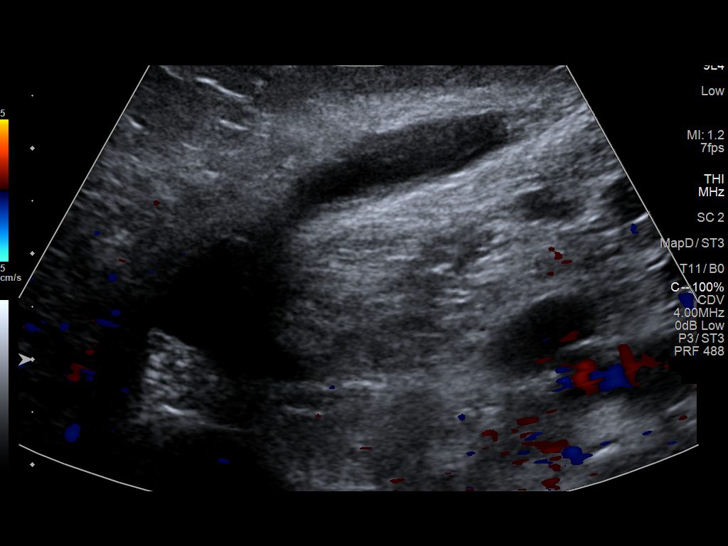

[13 of 24 positions shown; findings below may reference images not displayed]

FINDINGS: VENOUS

Normal compressibility of the common femoral, superficial femoral,
and popliteal veins, as well as the visualized calf veins.
Visualized portions of profunda femoral vein and great saphenous
vein unremarkable. No filling defects to suggest DVT on grayscale or
color Doppler imaging. Doppler waveforms show normal direction of
venous flow, normal respiratory plasticity and response to
augmentation.

Limited views of the contralateral common femoral vein are
unremarkable.

OTHER

Hypo to anechoic area in the LEFT popliteal fossa tracking along the
musculature. This measures 7.8 x 1.3 x 3.3 cm.

Limitations: none
IMPRESSION: 1. Negative for DVT in the LEFT lower extremity.
2. Possible ruptured Baker cyst though fluid in the LEFT popliteal
fossa about musculature is of uncertain significance. Correlation
with cross-sectional imaging and attention on upcoming MRI is
suggested. Correlate with any symptoms of infection.

## 2020-05-30 IMAGING — MR MR KNEE*L* W/O CM
7 series · 40 of 40 positions shown · non-contrast
Comparison: None.

CLINICAL DATA: Diffuse left knee pain for 2 weeks. No known injury.

EXAM:
MRI OF THE LEFT KNEE WITHOUT CONTRAST
TECHNIQUE: Multiplanar, multisequence MR imaging of the knee was performed. No
intravenous contrast was administered.

[Series 3: T2 fat-sat · axial · 4.0mm · 0.53mm/px · z∈[-101,+69]mm · 6 of 35 slices shown (1 of 3)]
[im 1/35]
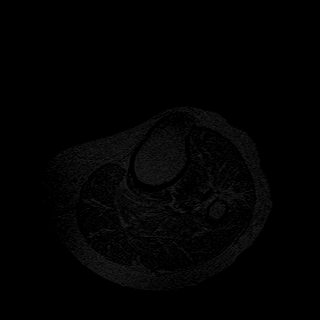
[im 7/35]
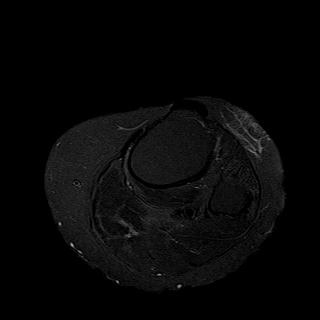
[im 14/35]
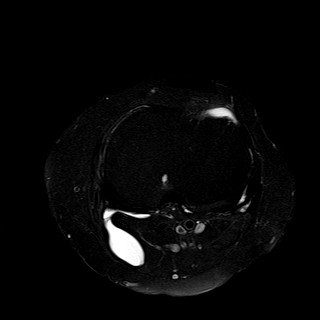
[im 21/35]
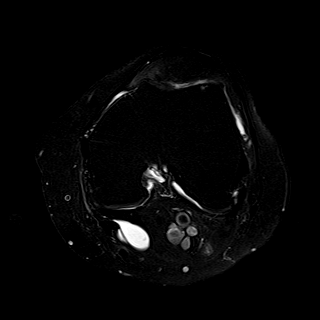
[im 28/35]
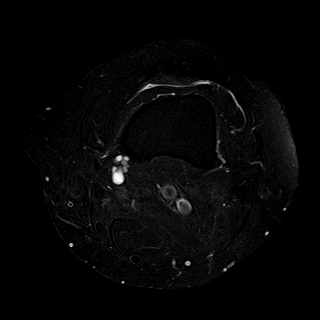
[im 35/35]
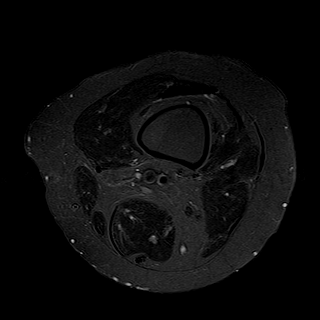

[Series 4: T1 · coronal · 4.0mm · 0.62mm/px · 5 of 29 slices shown]
[im 1/29]
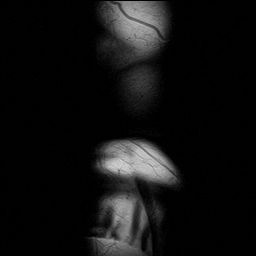
[im 8/29]
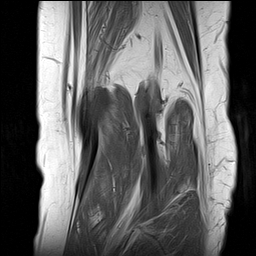
[im 15/29]
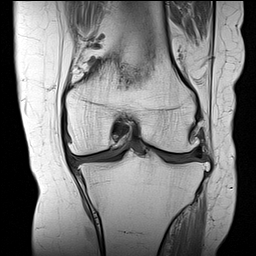
[im 22/29]
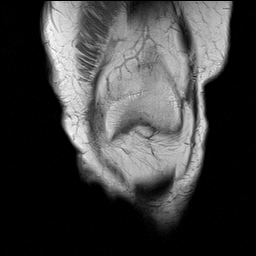
[im 29/29]
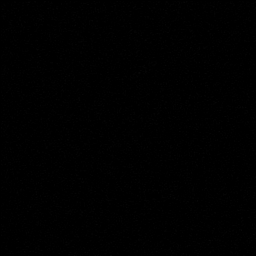

[Series 5: T2 fat-sat · coronal · 4.0mm · 0.62mm/px · 6 of 29 slices shown (2 of 3)]
[im 1/29]
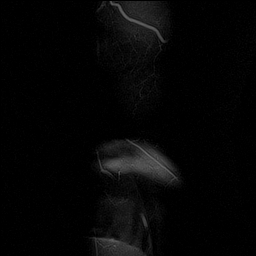
[im 6/29]
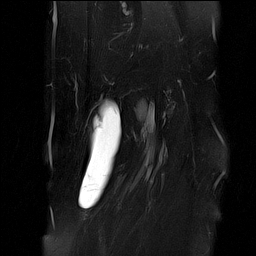
[im 12/29]
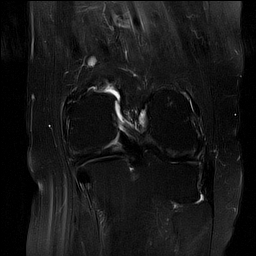
[im 17/29]
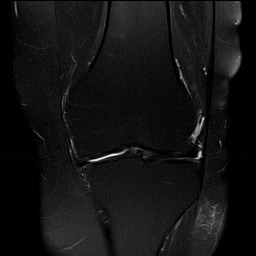
[im 23/29]
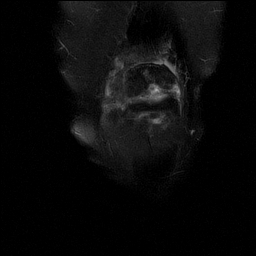
[im 29/29]
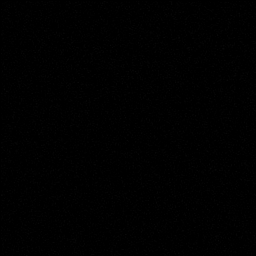

[Series 6: PD fat-sat · coronal · 4.0mm · 0.62mm/px · 6 of 29 slices shown (1 of 3)]
[im 1/29]
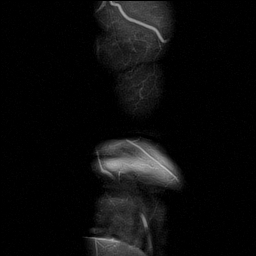
[im 6/29]
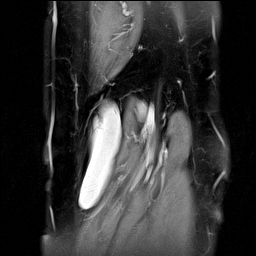
[im 12/29]
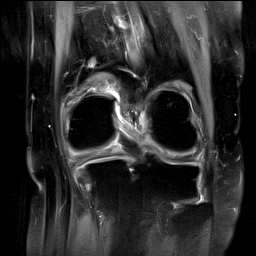
[im 17/29]
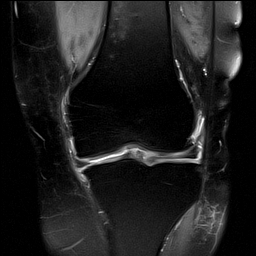
[im 23/29]
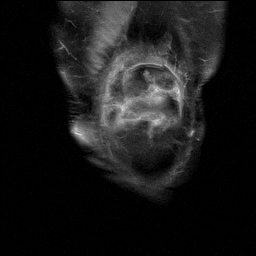
[im 29/29]
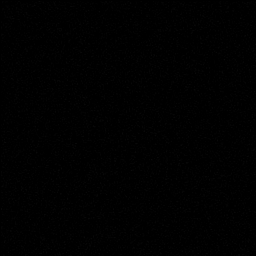

[Series 7: PD fat-sat · sagittal · 3.0mm · 0.62mm/px · 6 of 34 slices shown (2 of 3)]
[im 1/34]
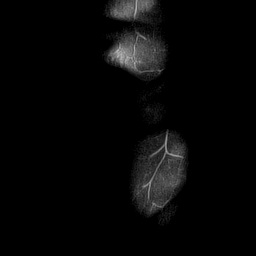
[im 7/34]
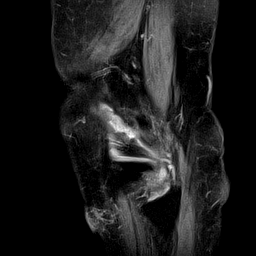
[im 14/34]
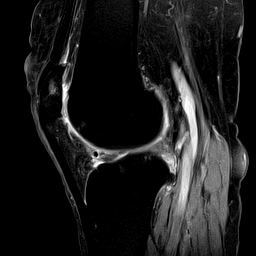
[im 20/34]
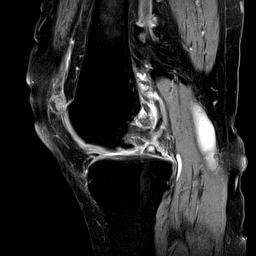
[im 27/34]
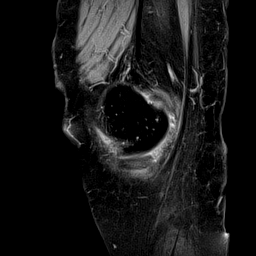
[im 34/34]
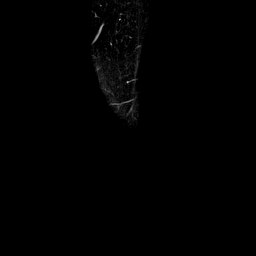

[Series 8: T2 fat-sat · sagittal · 3.0mm · 0.62mm/px · 7 of 35 slices shown (3 of 3)]
[im 1/35]
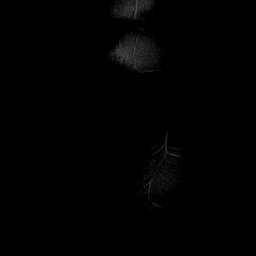
[im 6/35]
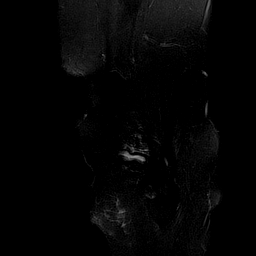
[im 12/35]
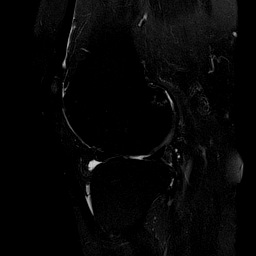
[im 18/35]
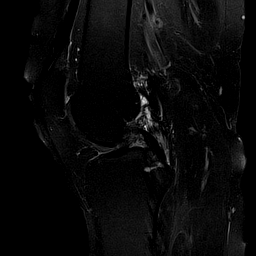
[im 23/35]
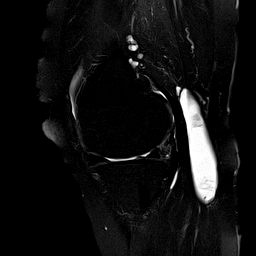
[im 29/35]
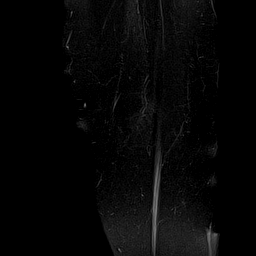
[im 35/35]
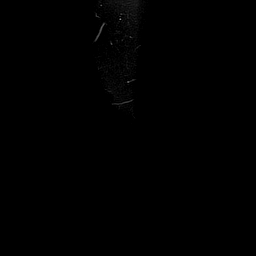

[Series 9: PD fat-sat · oblique · 2.0mm · 0.62mm/px · 4 of 19 slices shown (3 of 3)]
[im 1/19]
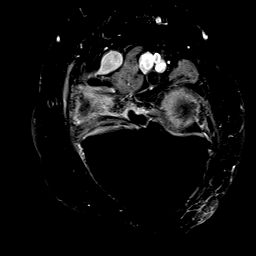
[im 7/19]
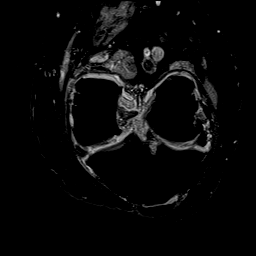
[im 13/19]
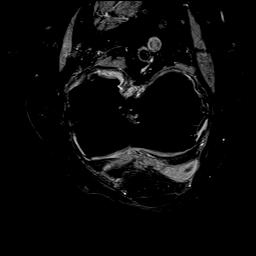
[im 19/19]
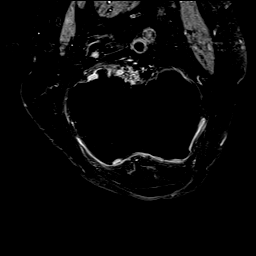

[40 of 40 positions shown; findings below may reference images not displayed]

FINDINGS: MENISCI

Medial meniscus: Complex tear in the posterior horn includes a
longitudinal component along the free edge centrally and a
horizontal component reaching the meniscal undersurface more
peripherally. The tear extends into the meniscal body. There is
marked fraying along the free edge of the body.

Lateral meniscus: Horizontal tear in the posterior horn reaches the
meniscal undersurface.

LIGAMENTS

Cruciates:  Intact.

Collaterals:  Intact.

CARTILAGE

Patellofemoral: Cartilage along the lateral patellar facet is
denuded.

Medial:  Thinned and irregular.

Lateral:  Thinned and irregular.

Joint:  Small joint effusion.

Popliteal Fossa: Baker's cyst measures 1.4 cm transverse by 3.1 cm
AP by 7.5 cm craniocaudal.

Extensor Mechanism:  Intact.

Bones: No fracture or worrisome lesion. There is tricompartmental
osteophytosis. Subchondral cysts are seen in the lateral femoral
trochlea and lateral patellar facet.

Other: None.
IMPRESSION: Dominant finding is tricompartmental osteoarthritis.

Tears of both the medial and lateral menisci as described above.

Moderately large Baker's cyst.

## 2020-05-30 MED ORDER — TRAMADOL HCL 50 MG PO TABS: 50.0000 mg | ORAL_TABLET | Freq: Three times a day (TID) | ORAL | 0 refills | Status: DC | PRN

## 2020-05-30 NOTE — Progress Notes (Addendum)
    Procedures performed today:    None.  Independent interpretation of notes and tests performed by another provider:   None.  Brief History, Exam, Impression, and Recommendations:    Primary osteoarthritis of left knee This is a very pleasant 69 year old female, we saw her about a week ago, she had an injection, and her knee was doing wonderful. She recalls getting out of the car, and feeling severe pain at the posterior/medial joint line, she cannot fully flex her knee right now. She has a positive McMurray sign, locking, ligaments are stable, no effusion. Because of failure of conservative measures and mechanical symptoms we are going to proceed with an MRI today. She did recently have a plane flight, and because she has some pain at the medial muscle belly of the gastrocnemius with a minimally positive Bevelyn Buckles' sign I am also going to get a lower extremity ultrasound. She understands that the next step is likely arthroscopy, adding some tramadol for pain in the meantime.  Baker's cyst of knee, left There is complex tearing of the posterior horn of the medial meniscus, this is likely contributing to pain, another dominant finding is a large Baker's cyst, this is also highly contributory, the treatment for the meniscal tear at this point is an operation so my advice to her is that she returns, we do a drainage of this Baker's cyst, to see if this gives her sufficient relief.  I am happy to double book her tomorrow some time for the quick procedure.    ___________________________________________ Gwen Her. Dianah Field, M.D., ABFM., CAQSM. Primary Care and [place] Instructor of [place] of [facility] of Medicine

## 2020-05-30 NOTE — Assessment & Plan Note (Signed)
This is a very pleasant 69 year old female, we saw her about a week ago, she had an injection, and her knee was doing wonderful. She recalls getting out of the car, and feeling severe pain at the posterior/medial joint line, she cannot fully flex her knee right now. She has a positive McMurray sign, locking, ligaments are stable, no effusion. Because of failure of conservative measures and mechanical symptoms we are going to proceed with an MRI today. She did recently have a plane flight, and because she has some pain at the medial muscle belly of the gastrocnemius with a minimally positive Bevelyn Buckles' sign I am also going to get a lower extremity ultrasound. She understands that the next step is likely arthroscopy, adding some tramadol for pain in the meantime.

## 2020-05-30 NOTE — Assessment & Plan Note (Signed)
There is complex tearing of the posterior horn of the medial meniscus, this is likely contributing to pain, another dominant finding is a large Baker's cyst, this is also highly contributory, the treatment for the meniscal tear at this point is an operation so my advice to her is that she returns, we do a drainage of this Baker's cyst, to see if this gives her sufficient relief.  I am happy to double book her tomorrow some time for the quick procedure.

## 2020-05-31 ENCOUNTER — Ambulatory Visit (INDEPENDENT_AMBULATORY_CARE_PROVIDER_SITE_OTHER): Payer: Medicare Other | Admitting: Sports Medicine

## 2020-05-31 DIAGNOSIS — M7122 Synovial cyst of popliteal space [Baker], left knee: Secondary | ICD-10-CM | POA: Diagnosis not present

## 2020-05-31 NOTE — Assessment & Plan Note (Signed)
Persistent pain in the back of the knee, MRI showed a Baker's cyst, today we made the decision to drain the cyst, if she still has persistent pain she will need arthroscopy to address her meniscus.

## 2020-05-31 NOTE — Progress Notes (Addendum)
    Procedures performed today:    Procedure: Real-time Ultrasound Guided  aspiration/injection of left knee Baker's cyst Device: Samsung HS60  Verbal informed consent obtained.  Time-out conducted.  Noted no overlying erythema, induration, or other signs of local infection.  Skin prepped in a sterile fashion.  Local anesthesia: Topical Ethyl chloride.  With sterile technique and under real time ultrasound guidance:  Using 18-gauge needle aspirated 17 cc of clear, straw-colored fluid, syringe switched and 1 cc lidocaine, 1 cc Kenalog 40 injected easily Completed without difficulty  Pain immediately resolved suggesting accurate placement of the medication.  Advised to call if fevers/chills, erythema, induration, drainage, or persistent bleeding.  Images permanently stored and available for review in the ultrasound unit.  Impression: Technically successful ultrasound guided injection.  Independent interpretation of notes and tests performed by another provider:   MRI personally reviewed, there is osteoarthritis, maceration of the posterior/medial meniscus, and a large Baker's cyst.  Brief History, Exam, Impression, and Recommendations:    Baker's cyst of knee, left Persistent pain in the back of the knee, MRI showed a Baker's cyst, today we made the decision to drain the cyst, if she still has persistent pain she will need arthroscopy to address her meniscus.    ___________________________________________ Gwen Her. Dianah Field, M.D., ABFM., CAQSM. Primary Care and [place] Instructor of [place] of [facility] of Medicine

## 2020-06-06 ENCOUNTER — Ambulatory Visit
Admission: RE | Admit: 2020-06-06 | Discharge: 2020-06-06 | Disposition: A | Discharge status: Home / Self Care | Payer: Medicare Other | Source: Ambulatory Visit | Attending: Surgery | Admitting: Surgery

## 2020-06-06 ENCOUNTER — Other Ambulatory Visit: Payer: Self-pay

## 2020-06-06 ENCOUNTER — Ambulatory Visit: Payer: Medicare Other

## 2020-06-06 DIAGNOSIS — N644 Mastodynia: Secondary | ICD-10-CM

## 2020-06-06 IMAGING — MG DIGITAL DIAGNOSTIC BILAT W/ TOMO W/ CAD
8 series · 8 of 24 positions shown · non-contrast
Comparison: Previous exam(s).

CLINICAL DATA: 68-year-old female with diffuse, intermittent left
breast pain for over a year.

EXAM:
DIGITAL DIAGNOSTIC BILATERAL MAMMOGRAM WITH TOMO AND CAD

[L MLO synth-2D]
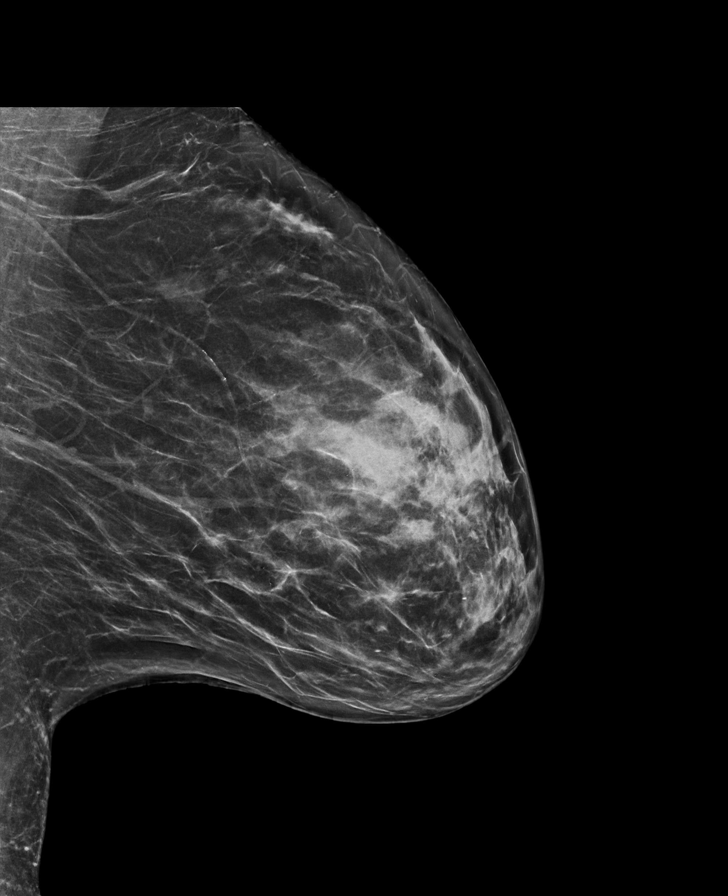

[R MLO synth-2D]
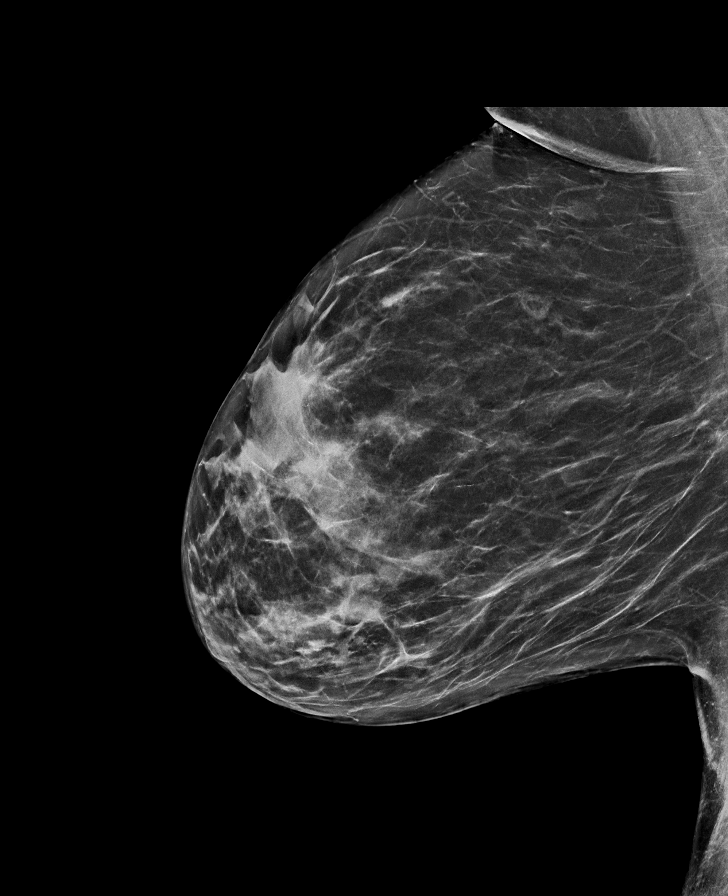

[L CC synth-2D]
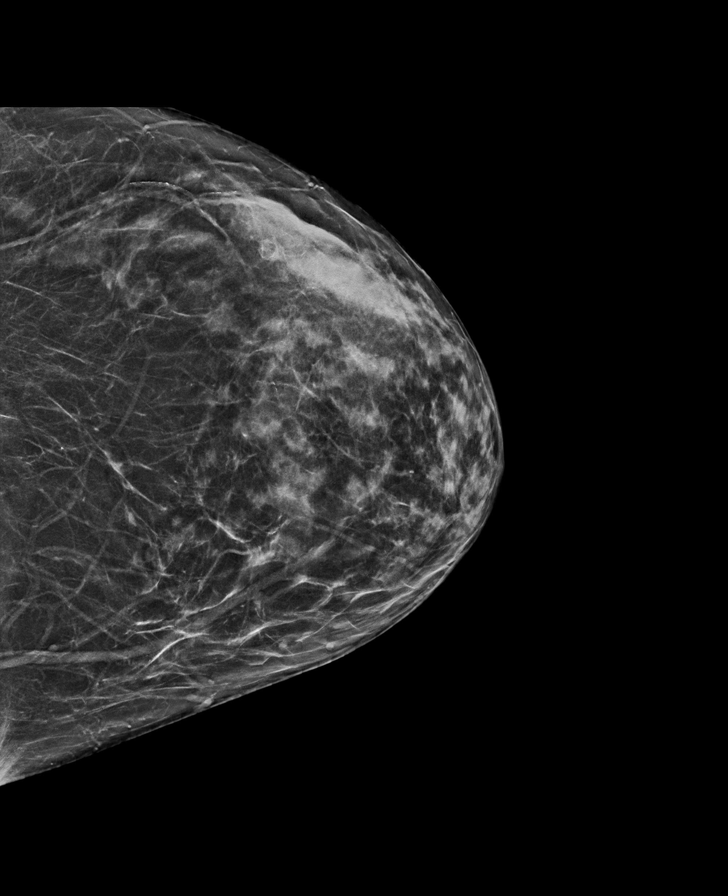

[R CC synth-2D]
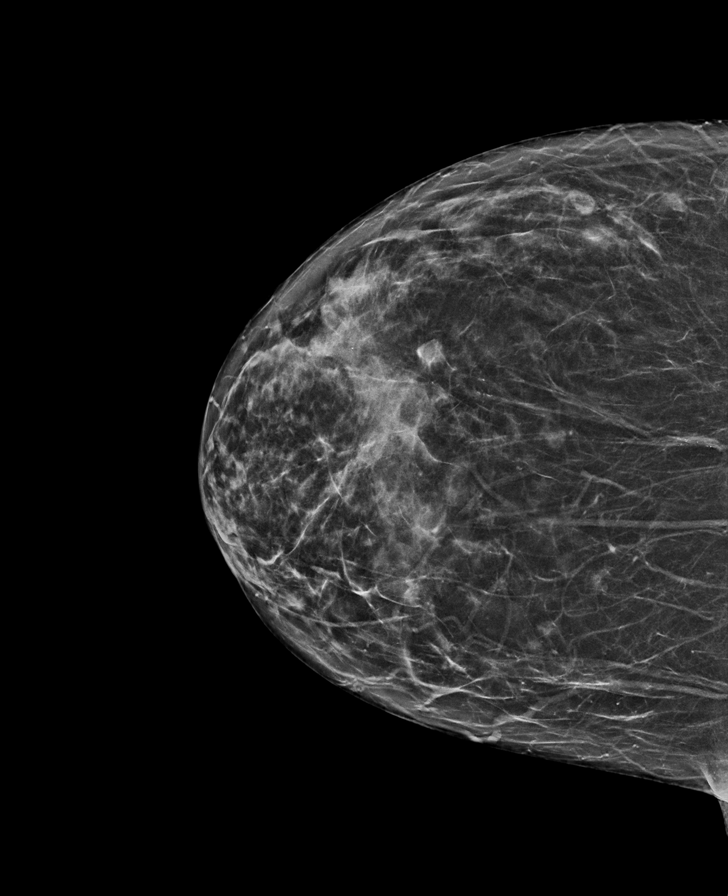

[L MLO tomo · tomo slice 35/70.0]
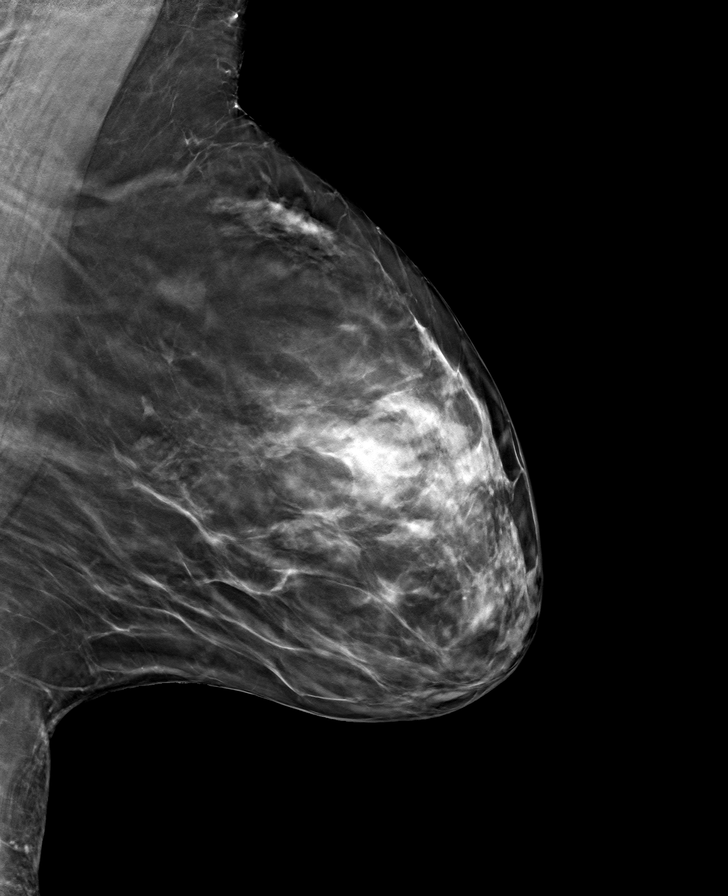

[R MLO tomo · tomo slice 37/74.0]
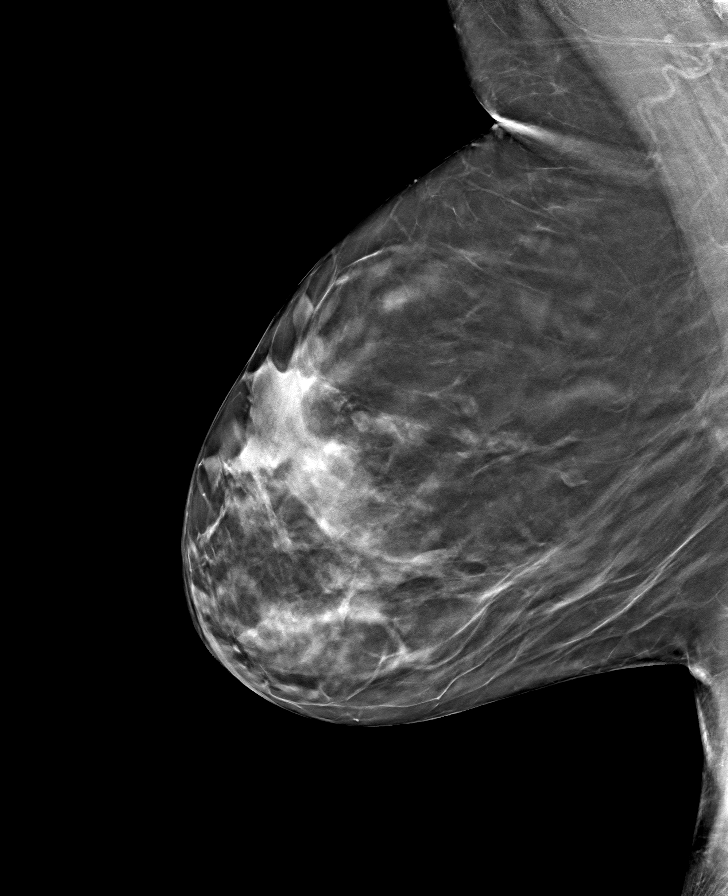

[L CC tomo · tomo slice 33/66.0]
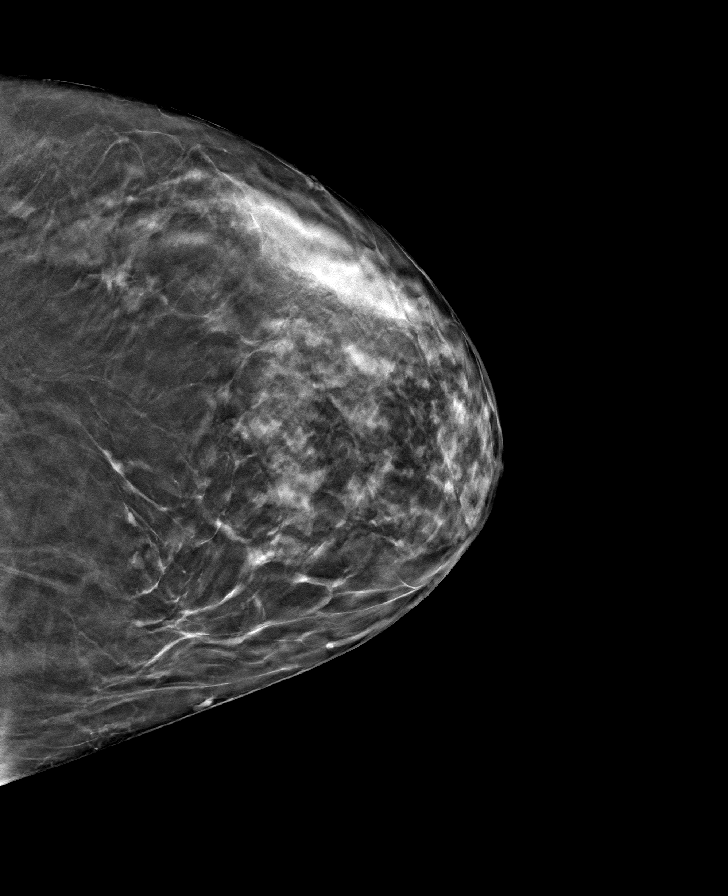

[R CC tomo · tomo slice 33/65.0]
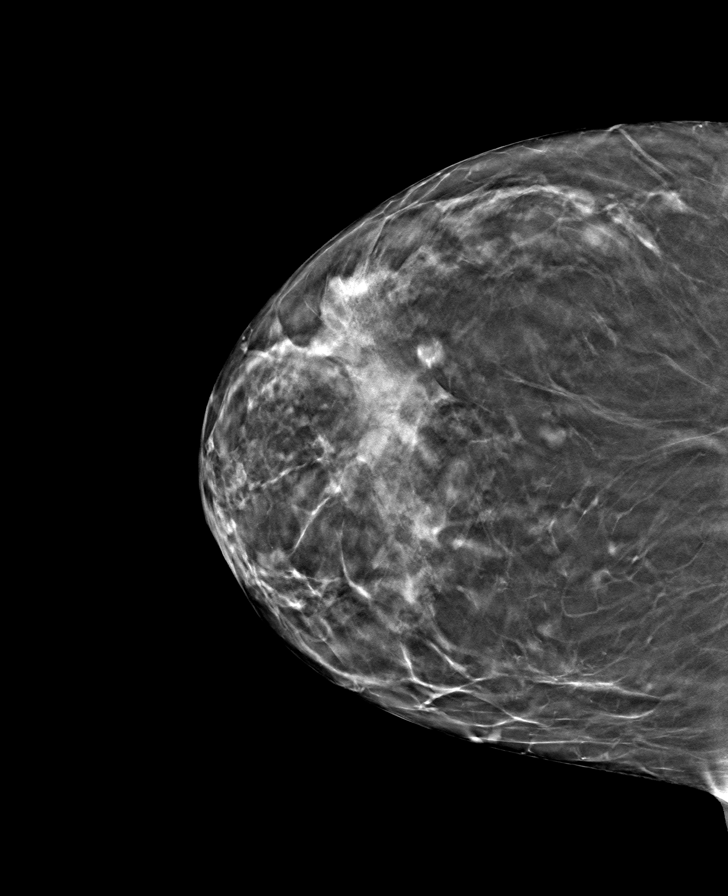

[8 of 24 positions shown; findings below may reference images not displayed]

ACR Breast Density Category c: The breast tissue is heterogeneously
dense, which may obscure small masses.
FINDINGS: No suspicious mammographic findings are identified in either breast.
The parenchymal pattern is stable.

Mammographic images were processed with CAD.
IMPRESSION: No mammographic evidence of malignancy.

RECOMMENDATION:
1. Clinical follow-up recommended for the diffusely painful area of
concern in the left breast. Any further workup should be based on
clinical grounds. Benign causes of breast pain, and possible
remedies, were discussed with the patient. Patient was encouraged to
follow-up with referring physician if pain became localized and
persistent or if a palpable lump/mass developed.
2.  Screening mammogram in one year.(Code:[TY])

I have discussed the findings and recommendations with the patient.
If applicable, a reminder letter will be sent to the patient
regarding the next appointment.

BI-RADS CATEGORY  1: Negative.

## 2020-06-21 ENCOUNTER — Encounter: Payer: Self-pay | Admitting: Sports Medicine

## 2020-06-21 ENCOUNTER — Other Ambulatory Visit: Payer: Self-pay

## 2020-06-21 ENCOUNTER — Ambulatory Visit (INDEPENDENT_AMBULATORY_CARE_PROVIDER_SITE_OTHER): Payer: Medicare Other | Admitting: Sports Medicine

## 2020-06-21 DIAGNOSIS — M1712 Unilateral primary osteoarthritis, left knee: Secondary | ICD-10-CM

## 2020-06-21 NOTE — Progress Notes (Signed)
    Procedures performed today:    None.  Independent interpretation of notes and tests performed by another provider:   None.  Brief History, Exam, Impression, and Recommendations:    Primary osteoarthritis of left knee This is a pleasant 69 year old female, she has a long history of left knee pain. We have injected her knee joint, she has had good relief, and good resolution of her anterior, lateral, and medial knee pain, she continues to have posterior knee pain. MRI at the time showed osteoarthritis but also medial and lateral meniscal tearing and a Baker's cyst, I drained the Baker's cyst with ultrasound guidance, and she got another 2 weeks of relief, but now has recurrence of Baker's cyst. I do think her meniscal tears need to be addressed surgically at this point, we discussed the limitation of arthroscopy in the setting of underlying osteoarthritis. I think I do not have much else to offer from a nonsurgical perspective so I would like her to touch base with Dr. Berenice Primas.    ___________________________________________ Gwen Her. Dianah Field, M.D., ABFM., CAQSM. Primary Care and [place] Instructor of [place] of [facility] of Medicine

## 2020-06-21 NOTE — Assessment & Plan Note (Signed)
This is a pleasant 69 year old female, she has a long history of left knee pain. We have injected her knee joint, she has had good relief, and good resolution of her anterior, lateral, and medial knee pain, she continues to have posterior knee pain. MRI at the time showed osteoarthritis but also medial and lateral meniscal tearing and a Baker's cyst, I drained the Baker's cyst with ultrasound guidance, and she got another 2 weeks of relief, but now has recurrence of Baker's cyst. I do think her meniscal tears need to be addressed surgically at this point, we discussed the limitation of arthroscopy in the setting of underlying osteoarthritis. I think I do not have much else to offer from a nonsurgical perspective so I would like her to touch base with Dr. Berenice Primas.

## 2020-06-22 DIAGNOSIS — M1712 Unilateral primary osteoarthritis, left knee: Secondary | ICD-10-CM

## 2020-06-23 DIAGNOSIS — M25562 Pain in left knee: Secondary | ICD-10-CM | POA: Diagnosis not present

## 2020-06-24 ENCOUNTER — Telehealth: Payer: Self-pay | Admitting: *Deleted

## 2020-06-24 NOTE — Telephone Encounter (Signed)
Pt is going to be having surgery with Dr. Griffin Basil and will need an EKG at her establish care appointment with you on Monday July 12th.

## 2020-06-27 ENCOUNTER — Ambulatory Visit (INDEPENDENT_AMBULATORY_CARE_PROVIDER_SITE_OTHER): Payer: Medicare Other | Admitting: Family Medicine

## 2020-06-27 ENCOUNTER — Encounter: Payer: Self-pay | Admitting: Family Medicine

## 2020-06-27 VITALS — BP 126/82 | HR 62 | Temp 97.8°F | Ht 68.9 in | Wt 189.0 lb

## 2020-06-27 DIAGNOSIS — D472 Monoclonal gammopathy: Secondary | ICD-10-CM | POA: Diagnosis not present

## 2020-06-27 DIAGNOSIS — Z01818 Encounter for other preprocedural examination: Secondary | ICD-10-CM | POA: Insufficient documentation

## 2020-06-27 DIAGNOSIS — Z9081 Acquired absence of spleen: Secondary | ICD-10-CM | POA: Diagnosis not present

## 2020-06-27 DIAGNOSIS — Z862 Personal history of diseases of the blood and blood-forming organs and certain disorders involving the immune mechanism: Secondary | ICD-10-CM | POA: Diagnosis not present

## 2020-06-27 LAB — CBC WITH DIFFERENTIAL/PLATELET
Absolute Monocytes: 693 cells/uL (ref 200–950)
Basophils Absolute: 39 cells/uL (ref 0–200)
Basophils Relative: 0.7 %
Eosinophils Absolute: 72 cells/uL (ref 15–500)
Eosinophils Relative: 1.3 %
HCT: 40.7 % (ref 35.0–45.0)
Hemoglobin: 14.2 g/dL (ref 11.7–15.5)
Lymphs Abs: 1832 cells/uL (ref 850–3900)
MCH: 34.6 pg — ABNORMAL HIGH (ref 27.0–33.0)
MCHC: 34.9 g/dL (ref 32.0–36.0)
MCV: 99.3 fL (ref 80.0–100.0)
MPV: 10.4 fL (ref 7.5–12.5)
Monocytes Relative: 12.6 %
Neutro Abs: 2866 cells/uL (ref 1500–7800)
Neutrophils Relative %: 52.1 %
Platelets: 355 10*3/uL (ref 140–400)
RBC: 4.1 10*6/uL (ref 3.80–5.10)
RDW: 13.3 % (ref 11.0–15.0)
Total Lymphocyte: 33.3 %
WBC: 5.5 10*3/uL (ref 3.8–10.8)

## 2020-06-27 NOTE — Patient Instructions (Addendum)
Great to meet you today! Stop downstairs and have labs completed. EKG looks fine.  I think you should be ok to proceed with surgery.   Let me know if gabapentin is not  Helpful for your sleep.   We'll plan to follow up in the Spring when you are due for your annual or as needed.

## 2020-06-27 NOTE — Progress Notes (Signed)
Donna Ramsey - 69 y.o. female MRN [id number]  Date of birth: 05/01/1951  Subjective Chief Complaint  Patient presents with  . Establish Care    HPI Donna Ramsey is a 69 y.o. female here today for initial exam and pre-operative clearance.  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  She is followed by heme/onc and GI specialists.  She is being scheduled for knee arthroscopy by Dr. Griffin Basil, hopefully in August.  They are planning on using general anesthesia.  She has not had any problems with anesthesia in the past.  She is up to date on appropriate vaccines related to her splenectomy.  Blood counts stable since splenectomy.    ROS:  A comprehensive ROS was completed and negative except as noted per HPI  Allergies  Allergen Reactions  . Cephalosporins Hives  . Cephapirin Hives  . Codeine Hives and Rash  . Penicillins Hives, Other (See Comments) and Rash    Hives   . Clindamycin Hives  . Pentazocine Other (See Comments)    Other reaction(s): Other (see comments) unknown unknown unknown unknown unknown unknown unknown unknown   . Quinolones Other (See Comments)    Achilles Pain  . Levofloxacin Rash and Other (See Comments)    tendonitis tendonitis unknown unknown   . Sulfa Antibiotics Rash    Past Medical History:  Diagnosis Date  . Asplenia   . Family history of breast cancer   . GERD (gastroesophageal reflux disease)   . Liver disease, unspecified    heterogenous echotexture  . Portal hypertension ([place])   . Splenic infarct     Past Surgical History:  Procedure Laterality Date  . ABDOMINAL HYSTERECTOMY    . BREAST BIOPSY Bilateral 50 yrs ago   benign  . FOOT SURGERY    . HERNIA REPAIR    . SPLENECTOMY, TOTAL      Social History   Socioeconomic History  . Marital status: Married    Spouse name: Not on file  . Number of children: Not on file  . Years of education: Not on file  . Highest  education level: Not on file  Occupational History  . Not on file  Tobacco Use  . Smoking status: Never Smoker  . Smokeless tobacco: Never Used  Substance and Sexual Activity  . Alcohol use: Yes    Comment: occ  . Drug use: Never  . Sexual activity: Never  Other Topics Concern  . Not on file  Social History Narrative  . Not on file   Social Determinants of Health   Financial Resource Strain:   . Difficulty of Paying Living Expenses:   Food Insecurity:   . Worried About Charity fundraiser in the Last Year:   . Arboriculturist in the Last Year:   Transportation Needs:   . Film/video editor (Medical):   Marland Kitchen Lack of Transportation (Non-Medical):   Physical Activity:   . Days of Exercise per Week:   . Minutes of Exercise per Session:   Stress:   . Feeling of Stress :   Social Connections:   . Frequency of Communication with Friends and Family:   . Frequency of Social Gatherings with Friends and Family:   . Attends Religious Services:   . Active Member of Clubs or Organizations:   . Attends Archivist Meetings:   Marland Kitchen Marital Status:     Family History  Problem Relation Age of Onset  .  Breast cancer Mother 32  . Stroke Mother   . Hypertension Father   . Heart attack Father   . Diabetes Sister   . Breast cancer Paternal Aunt   . Breast cancer Paternal Grandmother        dx 59s  . Rheum arthritis Daughter     Health Maintenance  Topic Date Due  . Hepatitis C Screening  Never done  . Meningococcal B Vaccine (1 of 4 - Increased Risk Bexsero 2-dose series) Never done  . COLONOSCOPY  Never done  . DEXA SCAN  Never done  . PNA vac Low Risk Adult (2 of 2 - PPSV23) 09/14/2018  . INFLUENZA VACCINE  07/17/2020  . TETANUS/TDAP  03/29/2021  . MAMMOGRAM  06/06/2022  . COVID-19 Vaccine  Completed      ----------------------------------------------------------------------------------------------------------------------------------------------------------------------------------------------------------------- Physical Exam BP 126/82 (BP Location: Right Arm, Patient Position: Sitting, Cuff Size: Normal)   Pulse 62   Temp 97.8 F (36.6 C)   Ht 5' 8.9" (1.75 m)   Wt 189 lb (85.7 kg)   SpO2 96%   BMI 27.99 kg/m   Physical Exam Constitutional:      Appearance: Normal appearance.  HENT:     Head: Normocephalic.  Eyes:     General: No scleral icterus. Cardiovascular:     Rate and Rhythm: Normal rate and regular rhythm.  Pulmonary:     Effort: Pulmonary effort is normal.     Breath sounds: Normal breath sounds.  Musculoskeletal:     Cervical back: Neck supple.  Skin:    General: Skin is warm.  Neurological:     General: No focal deficit present.     Mental Status: She is alert.  Psychiatric:        Mood and Affect: Mood normal.        Behavior: Behavior normal.    EKG: NSR with rate of 65.  No ST-T wave changes.   ------------------------------------------------------------------------------------------------------------------------------------------------------------------------------------------------------------------- Assessment and Plan  MGUS (monoclonal gammopathy of unknown significance) Followed by heme/onc, stable at this time.   History of splenectomy Stable at this time.   Up to date on immunizations.   Pre-operative clearance EKG complete and normal Check CBC Revised cardiac risk index: Class I risk Reasonable to proceed with surgery.       No orders of the defined types were placed in this encounter.   No follow-ups on file.    This visit occurred during the SARS-CoV-2 public health emergency.  Safety protocols were in place, including screening questions prior to the visit, additional usage of staff PPE, and extensive cleaning of exam room  while observing appropriate contact time as indicated for disinfecting solutions.

## 2020-06-27 NOTE — Assessment & Plan Note (Signed)
Followed by heme/onc, stable at this time.

## 2020-06-27 NOTE — Assessment & Plan Note (Signed)
Stable at this time.   Up to date on immunizations.

## 2020-06-27 NOTE — Assessment & Plan Note (Signed)
EKG complete and normal Check CBC Revised cardiac risk index: Class I risk Reasonable to proceed with surgery.

## 2020-07-05 ENCOUNTER — Encounter: Payer: Self-pay | Admitting: Family Medicine

## 2020-07-21 ENCOUNTER — Other Ambulatory Visit: Payer: Self-pay

## 2020-07-21 ENCOUNTER — Encounter (HOSPITAL_BASED_OUTPATIENT_CLINIC_OR_DEPARTMENT_OTHER): Payer: Self-pay | Admitting: Orthopaedic Surgery

## 2020-07-25 ENCOUNTER — Other Ambulatory Visit (HOSPITAL_COMMUNITY)
Admission: RE | Admit: 2020-07-25 | Discharge: 2020-07-25 | Disposition: A | Discharge status: Home / Self Care | Payer: Medicare Other | Source: Ambulatory Visit | Attending: Orthopaedic Surgery | Admitting: Orthopaedic Surgery

## 2020-07-25 DIAGNOSIS — Z01812 Encounter for preprocedural laboratory examination: Secondary | ICD-10-CM | POA: Insufficient documentation

## 2020-07-25 DIAGNOSIS — Z20822 Contact with and (suspected) exposure to covid-19: Secondary | ICD-10-CM | POA: Insufficient documentation

## 2020-07-25 LAB — SARS CORONAVIRUS 2 (TAT 6-24 HRS): SARS Coronavirus 2: NEGATIVE

## 2020-07-25 NOTE — Progress Notes (Signed)

## 2020-07-25 NOTE — H&P (Signed)
PREOPERATIVE H&P  Chief Complaint: LEFT KNEE CHONDROMALACIA PATELLA, MEDIAL MENISCUS TEAR, LATERAL MENISCUS TEAR  HPI: Donna Ramsey is a 69 y.o. female who is scheduled for: LEFT KNEE ARTHROSCOPY WITH MEDIAL MENISECTOMY LEFT KNEE ARTHROSCOPY WITH LATERAL MENISECTOMY CHONDROPLASTY LEFT KNEE.   Patient has a past medical history significant for Chronic neutropenia, MGUS and portal hypertension.   Patient is a 69 year-old female with a history of left knee pain for many months.  She has had a Bakers cyst.  She has had pain in the knee after injection.  She hasn't made much progress.  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  She has catching and locking in the knee.  She is unable to with her activities as they are currently.    Her symptoms are rated as moderate to severe, and have been worsening.  This is significantly impairing activities of daily living.    Please see clinic note for further details on this patient's care.    She has elected for surgical management.   Past Medical History:  Diagnosis Date  . Asplenia   . Family history of breast cancer   . GERD (gastroesophageal reflux disease)   . Liver disease, unspecified    heterogenous echotexture  . Portal hypertension ([place])   . Splenic infarct    Past Surgical History:  Procedure Laterality Date  . ABDOMINAL HYSTERECTOMY    . BREAST BIOPSY Bilateral 50 yrs ago   benign  . FOOT SURGERY    . HERNIA REPAIR    . SPLENECTOMY, TOTAL     Social History   Socioeconomic History  . Marital status: Married    Spouse name: Not on file  . Number of children: Not on file  . Years of education: Not on file  . Highest education level: Not on file  Occupational History  . Not on file  Tobacco Use  . Smoking status: Never Smoker  . Smokeless tobacco: Never Used  Substance and Sexual Activity  . Alcohol use: Yes    Comment: occ  . Drug use: Never  . Sexual activity: Never  Other  Topics Concern  . Not on file  Social History Narrative  . Not on file   Social Determinants of Health   Financial Resource Strain:   . Difficulty of Paying Living Expenses:   Food Insecurity:   . Worried About Charity fundraiser in the Last Year:   . Arboriculturist in the Last Year:   Transportation Needs:   . Film/video editor (Medical):   Marland Kitchen Lack of Transportation (Non-Medical):   Physical Activity:   . Days of Exercise per Week:   . Minutes of Exercise per Session:   Stress:   . Feeling of Stress :   Social Connections:   . Frequency of Communication with Friends and Family:   . Frequency of Social Gatherings with Friends and Family:   . Attends Religious Services:   . Active Member of Clubs or Organizations:   . Attends Archivist Meetings:   Marland Kitchen Marital Status:    Family History  Problem Relation Age of Onset  . Breast cancer Mother 8  . Stroke Mother   . Hypertension Father   . Heart attack Father   . Diabetes Sister   . Breast cancer Paternal Aunt   . Breast cancer Paternal Grandmother        dx 46s  . Rheum arthritis  Daughter    Allergies  Allergen Reactions  . Cephalosporins Hives  . Cephapirin Hives  . Neomycin Anaphylaxis  . Codeine Hives and Rash  . Penicillins Hives, Other (See Comments) and Rash    Hives   . Clindamycin Hives  . Pentazocine Other (See Comments)    Other reaction(s): Other (see comments) unknown unknown unknown unknown unknown unknown unknown unknown   . Quinolones Other (See Comments)    Achilles Pain  . Levofloxacin Rash and Other (See Comments)    tendonitis tendonitis unknown unknown   . Sulfa Antibiotics Rash   Prior to Admission medications   Medication Sig Start Date End Date Taking? Authorizing Provider  ALPRAZolam Duanne Moron) 0.25 MG tablet Take by mouth. 05/03/20  Yes [provider]  b complex vitamins capsule Take 1 capsule by mouth daily.   Yes [provider]    CALCIUM-VITAMIN D PO Take by mouth.   Yes [provider]  cetirizine (ZYRTEC) 10 MG tablet Take 10 mg by mouth daily.   Yes [provider]  famotidine-calcium carbonate-magnesium hydroxide (PEPCID COMPLETE) 10-800-165 MG chewable tablet Chew 1 tablet by mouth daily as needed.   Yes [provider]  Hyoscyamine Sulfate SL 0.125 MG SUBL Take by mouth. 04/10/18  Yes [provider]  meloxicam (MOBIC) 15 MG tablet Take 15 mg by mouth daily.   Yes [provider]  MILK THISTLE EXTRACT PO Take 400 mg by mouth in the morning and at bedtime.    Yes [provider]  Probiotic Product (PRO-BIOTIC BLEND PO)    Yes [provider]  triamcinolone cream (KENALOG) 0.1 % Apply 1 application topically as needed.   Yes [provider]  Zinc Sulfate (ZINC-220 PO) Take by mouth.   Yes [provider]  albuterol (VENTOLIN HFA) 108 (90 Base) MCG/ACT inhaler Inhale into the lungs. Patient not taking: Reported on 06/27/2020 05/03/20   [provider]  gabapentin (NEURONTIN) 100 MG capsule Take by mouth. Patient not taking: Reported on 06/27/2020 06/23/20   [provider]    ROS: All other systems have been reviewed and were otherwise negative with the exception of those mentioned in the HPI and as above.  Physical Exam: General: Alert, no acute distress Cardiovascular: No pedal edema Respiratory: No cyanosis, no use of accessory musculature GI: No organomegaly, abdomen is soft and non-tender Skin: No lesions in the area of chief complaint Neurologic: Sensation intact distally Psychiatric: Patient is competent for consent with normal mood and affect Lymphatic: No axillary or cervical lymphadenopathy  MUSCULOSKELETAL:  Examination of the left knee demonstrates tenderness to palpation medially and laterally at the joint, but mostly on the medial side.  Moderate effusion.  Ligamentous exam is normal.     Imaging: X-rays demonstrate mild joint space narrowing, but really good preserved space on most views. MRI demonstrates both medial and lateral meniscus tears and chondral changes throughout the knee.    Assessment: Left knee medial and lateral meniscus tears in the setting of mild to moderate arthritis.  Plan: Plan for Procedure(s): LEFT KNEE ARTHROSCOPY WITH MEDIAL MENISECTOMY LEFT KNEE ARTHROSCOPY WITH LATERAL MENISECTOMY CHONDROPLASTY LEFT KNEE  The risks benefits and alternatives were discussed with the patient including but not limited to the risks of nonoperative treatment, versus surgical intervention including infection, bleeding, nerve injury,  blood clots, cardiopulmonary complications, morbidity, mortality, among others, and they were willing to proceed.   The patient acknowledged the explanation, agreed to proceed with the plan and consent  was signed.   Operative Plan: Left knee scope with medial and lateral meniscectomy with chondroplasty Discharge Medications: Meloxicam 15mg , Oxycodone, Zofran (avoid Tylenol due to medical history) DVT Prophylaxis: Aspirin Physical Therapy: +/- outpatient PT Special Discharge needs: Knee immobilizer if receives a block   Ethelda Chick, PA-C  07/25/2020 11:17 AM

## 2020-07-28 ENCOUNTER — Ambulatory Visit (HOSPITAL_BASED_OUTPATIENT_CLINIC_OR_DEPARTMENT_OTHER): Payer: Medicare Other | Admitting: Certified Registered"

## 2020-07-28 ENCOUNTER — Encounter (HOSPITAL_BASED_OUTPATIENT_CLINIC_OR_DEPARTMENT_OTHER): Payer: Self-pay | Admitting: Orthopaedic Surgery

## 2020-07-28 ENCOUNTER — Encounter (HOSPITAL_BASED_OUTPATIENT_CLINIC_OR_DEPARTMENT_OTHER)
Admission: RE | Disposition: A | Discharge status: Home / Self Care | Payer: Self-pay | Source: Home / Self Care | Attending: Orthopaedic Surgery

## 2020-07-28 ENCOUNTER — Ambulatory Visit (HOSPITAL_BASED_OUTPATIENT_CLINIC_OR_DEPARTMENT_OTHER)
Admission: RE | Admit: 2020-07-28 | Discharge: 2020-07-28 | Disposition: A | Discharge status: Home / Self Care | Payer: Medicare Other | Attending: Orthopaedic Surgery | Admitting: Orthopaedic Surgery

## 2020-07-28 ENCOUNTER — Other Ambulatory Visit: Payer: Self-pay

## 2020-07-28 DIAGNOSIS — K766 Portal hypertension: Secondary | ICD-10-CM | POA: Insufficient documentation

## 2020-07-28 DIAGNOSIS — M23232 Derangement of other medial meniscus due to old tear or injury, left knee: Secondary | ICD-10-CM | POA: Diagnosis not present

## 2020-07-28 DIAGNOSIS — D472 Monoclonal gammopathy: Secondary | ICD-10-CM | POA: Diagnosis not present

## 2020-07-28 DIAGNOSIS — M23262 Derangement of other lateral meniscus due to old tear or injury, left knee: Secondary | ICD-10-CM | POA: Diagnosis not present

## 2020-07-28 DIAGNOSIS — M1712 Unilateral primary osteoarthritis, left knee: Secondary | ICD-10-CM | POA: Diagnosis not present

## 2020-07-28 DIAGNOSIS — Z79899 Other long term (current) drug therapy: Secondary | ICD-10-CM | POA: Insufficient documentation

## 2020-07-28 DIAGNOSIS — K219 Gastro-esophageal reflux disease without esophagitis: Secondary | ICD-10-CM | POA: Insufficient documentation

## 2020-07-28 DIAGNOSIS — S83282A Other tear of lateral meniscus, current injury, left knee, initial encounter: Secondary | ICD-10-CM | POA: Diagnosis not present

## 2020-07-28 DIAGNOSIS — Z791 Long term (current) use of non-steroidal anti-inflammatories (NSAID): Secondary | ICD-10-CM | POA: Diagnosis not present

## 2020-07-28 DIAGNOSIS — S83242A Other tear of medial meniscus, current injury, left knee, initial encounter: Secondary | ICD-10-CM | POA: Diagnosis not present

## 2020-07-28 HISTORY — PX: CHONDROPLASTY: SHX5177

## 2020-07-28 HISTORY — PX: KNEE ARTHROSCOPY WITH MEDIAL MENISECTOMY: SHX5651

## 2020-07-28 HISTORY — PX: KNEE ARTHROSCOPY WITH LATERAL MENISECTOMY: SHX6193

## 2020-07-28 SURGERY — ARTHROSCOPY, KNEE, WITH MEDIAL MENISCECTOMY
Anesthesia: General | Site: Knee | Laterality: Left

## 2020-07-28 MED ORDER — SODIUM CHLORIDE 0.9 % IR SOLN
Status: DC | PRN
  Administered 2020-07-28: 3000 mL

## 2020-07-28 MED ORDER — VANCOMYCIN HCL IN DEXTROSE 1-5 GM/200ML-% IV SOLN
1000.0000 mg | INTRAVENOUS | Status: AC
  Administered 2020-07-28: 1000 mg via INTRAVENOUS

## 2020-07-28 MED ORDER — FENTANYL CITRATE (PF) 100 MCG/2ML IJ SOLN
INTRAMUSCULAR | Status: AC
  Filled 2020-07-28: qty 2

## 2020-07-28 MED ORDER — AMISULPRIDE (ANTIEMETIC) 5 MG/2ML IV SOLN: 10.0000 mg | Freq: Once | INTRAVENOUS | Status: DC | PRN

## 2020-07-28 MED ORDER — PROPOFOL 10 MG/ML IV BOLUS
INTRAVENOUS | Status: AC
  Filled 2020-07-28: qty 20

## 2020-07-28 MED ORDER — DEXAMETHASONE SODIUM PHOSPHATE 10 MG/ML IJ SOLN
INTRAMUSCULAR | Status: DC | PRN
  Administered 2020-07-28: 5 mg via INTRAVENOUS

## 2020-07-28 MED ORDER — ASPIRIN 81 MG PO CHEW
81.0000 mg | CHEWABLE_TABLET | Freq: Two times a day (BID) | ORAL | 0 refills | Status: AC
Start: 2020-07-28 — End: 2020-09-08

## 2020-07-28 MED ORDER — OXYCODONE HCL 5 MG PO TABS: ORAL_TABLET | ORAL | 0 refills | Status: AC

## 2020-07-28 MED ORDER — BUPIVACAINE HCL (PF) 0.25 % IJ SOLN
INTRAMUSCULAR | Status: DC | PRN
  Administered 2020-07-28: 20 mL via INTRA_ARTICULAR

## 2020-07-28 MED ORDER — OXYCODONE HCL 5 MG PO TABS: 5.0000 mg | ORAL_TABLET | Freq: Once | ORAL | Status: DC | PRN

## 2020-07-28 MED ORDER — KETOROLAC TROMETHAMINE 30 MG/ML IJ SOLN
INTRAMUSCULAR | Status: AC
  Filled 2020-07-28: qty 1

## 2020-07-28 MED ORDER — MIDAZOLAM HCL 5 MG/5ML IJ SOLN
INTRAMUSCULAR | Status: DC | PRN
  Administered 2020-07-28: 2 mg via INTRAVENOUS

## 2020-07-28 MED ORDER — ONDANSETRON HCL 4 MG/2ML IJ SOLN
INTRAMUSCULAR | Status: DC | PRN
  Administered 2020-07-28: 4 mg via INTRAVENOUS

## 2020-07-28 MED ORDER — ACETAMINOPHEN 500 MG PO TABS
1000.0000 mg | ORAL_TABLET | Freq: Three times a day (TID) | ORAL | 0 refills | Status: AC
Start: 2020-07-28 — End: 2020-08-11

## 2020-07-28 MED ORDER — HYDROMORPHONE HCL 1 MG/ML IJ SOLN
0.2500 mg | INTRAMUSCULAR | Status: DC | PRN
  Administered 2020-07-28: 0.5 mg via INTRAVENOUS

## 2020-07-28 MED ORDER — FENTANYL CITRATE (PF) 100 MCG/2ML IJ SOLN
INTRAMUSCULAR | Status: DC | PRN
  Administered 2020-07-28: 50 ug via INTRAVENOUS
  Administered 2020-07-28: 25 ug via INTRAVENOUS

## 2020-07-28 MED ORDER — HYDROMORPHONE HCL 1 MG/ML IJ SOLN
INTRAMUSCULAR | Status: AC
  Filled 2020-07-28: qty 0.5

## 2020-07-28 MED ORDER — DEXAMETHASONE SODIUM PHOSPHATE 10 MG/ML IJ SOLN
INTRAMUSCULAR | Status: AC
  Filled 2020-07-28: qty 1

## 2020-07-28 MED ORDER — LACTATED RINGERS IV SOLN: INTRAVENOUS | Status: DC

## 2020-07-28 MED ORDER — MEPERIDINE HCL 25 MG/ML IJ SOLN: 6.2500 mg | INTRAMUSCULAR | Status: DC | PRN

## 2020-07-28 MED ORDER — VANCOMYCIN HCL IN DEXTROSE 1-5 GM/200ML-% IV SOLN
INTRAVENOUS | Status: AC
  Filled 2020-07-28: qty 200

## 2020-07-28 MED ORDER — LIDOCAINE 2% (20 MG/ML) 5 ML SYRINGE
INTRAMUSCULAR | Status: DC | PRN
  Administered 2020-07-28: 60 mg via INTRAVENOUS

## 2020-07-28 MED ORDER — OXYCODONE HCL 5 MG/5ML PO SOLN: 5.0000 mg | Freq: Once | ORAL | Status: DC | PRN

## 2020-07-28 MED ORDER — PROMETHAZINE HCL 25 MG/ML IJ SOLN: 6.2500 mg | INTRAMUSCULAR | Status: DC | PRN

## 2020-07-28 MED ORDER — ONDANSETRON HCL 4 MG PO TABS: 4.0000 mg | ORAL_TABLET | Freq: Three times a day (TID) | ORAL | 1 refills | Status: AC | PRN

## 2020-07-28 MED ORDER — IBUPROFEN 800 MG PO TABS
800.0000 mg | ORAL_TABLET | Freq: Three times a day (TID) | ORAL | 0 refills | Status: AC
Start: 2020-07-28 — End: 2020-08-11

## 2020-07-28 MED ORDER — ONDANSETRON HCL 4 MG/2ML IJ SOLN
INTRAMUSCULAR | Status: AC
  Filled 2020-07-28: qty 2

## 2020-07-28 MED ORDER — PROPOFOL 10 MG/ML IV BOLUS
INTRAVENOUS | Status: DC | PRN
  Administered 2020-07-28: 160 mg via INTRAVENOUS

## 2020-07-28 MED ORDER — KETOROLAC TROMETHAMINE 30 MG/ML IJ SOLN
INTRAMUSCULAR | Status: DC | PRN
  Administered 2020-07-28: 30 mg via INTRAVENOUS

## 2020-07-28 MED ORDER — MIDAZOLAM HCL 2 MG/2ML IJ SOLN
INTRAMUSCULAR | Status: AC
  Filled 2020-07-28: qty 2

## 2020-07-28 SURGICAL SUPPLY — 41 items
APL PRP STRL LF DISP 70% ISPRP (MISCELLANEOUS) ×1
BANDAGE ESMARK 6X9 LF (GAUZE/BANDAGES/DRESSINGS) IMPLANT
BLADE CLIPPER SURG (BLADE) IMPLANT
BNDG CMPR 9X6 STRL LF SNTH (GAUZE/BANDAGES/DRESSINGS)
BNDG ELASTIC 6X5.8 VLCR STR LF (GAUZE/BANDAGES/DRESSINGS) ×2 IMPLANT
BNDG ESMARK 6X9 LF (GAUZE/BANDAGES/DRESSINGS)
CHLORAPREP W/TINT 26 (MISCELLANEOUS) ×2 IMPLANT
CLSR STERI-STRIP ANTIMIC 1/2X4 (GAUZE/BANDAGES/DRESSINGS) ×2 IMPLANT
CUFF TOURN SGL QUICK 34 (TOURNIQUET CUFF)
CUFF TRNQT CYL 34X4.125X (TOURNIQUET CUFF) IMPLANT
DISSECTOR 3.5MM X 13CM CVD (MISCELLANEOUS) IMPLANT
DISSECTOR 4.0MMX13CM CVD (MISCELLANEOUS) ×2 IMPLANT
DRAPE ARTHROSCOPY W/POUCH 90 (DRAPES) ×2 IMPLANT
DRAPE IMP U-DRAPE 54X76 (DRAPES) ×2 IMPLANT
DRAPE U-SHAPE 47X51 STRL (DRAPES) ×2 IMPLANT
GAUZE SPONGE 4X4 12PLY STRL (GAUZE/BANDAGES/DRESSINGS) ×2 IMPLANT
GLOVE BIO SURGEON STRL SZ 6.5 (GLOVE) ×2 IMPLANT
GLOVE BIOGEL PI IND STRL 6.5 (GLOVE) ×3 IMPLANT
GLOVE BIOGEL PI IND STRL 8 (GLOVE) ×1 IMPLANT
GLOVE BIOGEL PI INDICATOR 6.5 (GLOVE) ×3
GLOVE BIOGEL PI INDICATOR 8 (GLOVE) ×1
GLOVE ECLIPSE 6.5 STRL STRAW (GLOVE) ×2 IMPLANT
GLOVE ECLIPSE 8.0 STRL XLNG CF (GLOVE) ×4 IMPLANT
GOWN STRL REUS W/ TWL LRG LVL3 (GOWN DISPOSABLE) ×2 IMPLANT
GOWN STRL REUS W/TWL LRG LVL3 (GOWN DISPOSABLE) ×4
GOWN STRL REUS W/TWL XL LVL3 (GOWN DISPOSABLE) ×2 IMPLANT
IV NS IRRIG 3000ML ARTHROMATIC (IV SOLUTION) ×2 IMPLANT
KIT TURNOVER KIT B (KITS) ×2 IMPLANT
MANIFOLD NEPTUNE II (INSTRUMENTS) ×2 IMPLANT
NDL SAFETY ECLIPSE 18X1.5 (NEEDLE) IMPLANT
NEEDLE HYPO 18GX1.5 SHARP (NEEDLE)
NS IRRIG 1000ML POUR BTL (IV SOLUTION) IMPLANT
PACK ARTHROSCOPY DSU (CUSTOM PROCEDURE TRAY) ×2 IMPLANT
PORT APPOLLO RF 90DEGREE MULTI (SURGICAL WAND) IMPLANT
SLEEVE SCD COMPRESS KNEE MED (MISCELLANEOUS) ×2 IMPLANT
SUT MNCRL AB 4-0 PS2 18 (SUTURE) ×2 IMPLANT
SYR 5ML LUER SLIP (SYRINGE) IMPLANT
TOWEL GREEN STERILE FF (TOWEL DISPOSABLE) ×4 IMPLANT
TUBE CONNECTING 20X1/4 (TUBING) ×2 IMPLANT
TUBING ARTHROSCOPY IRRIG 16FT (MISCELLANEOUS) ×2 IMPLANT
WATER STERILE IRR 1000ML POUR (IV SOLUTION) IMPLANT

## 2020-07-28 NOTE — Interval H&P Note (Signed)
History and Physical Interval Note:  07/28/2020 7:17 AM  Donna Ramsey  has presented today for surgery, with the diagnosis of LEFT KNEE CHONDROMALACIA PATELLA, MEDIAL MENISCUS TEAR, LATERAL MENISCUS TEAR.  The various methods of treatment have been discussed with the patient and family. After consideration of risks, benefits and other options for treatment, the patient has consented to  Procedure(s): LEFT KNEE ARTHROSCOPY WITH MEDIAL MENISECTOMY (Left) LEFT KNEE ARTHROSCOPY WITH LATERAL MENISECTOMY (Left) CHONDROPLASTY LEFT KNEE (Left) as a surgical intervention.  The patient's history has been reviewed, patient examined, no change in status, stable for surgery.  I have reviewed the patient's chart and labs.  Questions were answered to the patient's satisfaction.     Hiram Gash

## 2020-07-28 NOTE — Transfer of Care (Signed)
Immediate Anesthesia Transfer of Care Note  Patient: Donna Ramsey  Procedure(s) Performed: LEFT KNEE ARTHROSCOPY WITH MEDIAL MENISECTOMY (Left Knee) LEFT KNEE ARTHROSCOPY WITH LATERAL MENISECTOMY (Left Knee) CHONDROPLASTY LEFT KNEE (Left Knee)  Patient Location: PACU  Anesthesia Type:General  Level of Consciousness: drowsy and patient cooperative  Airway & Oxygen Therapy: Patient Spontanous Breathing and Patient connected to face mask oxygen  Post-op Assessment: Report given to RN and Post -op Vital signs reviewed and stable  Post vital signs: Reviewed and stable  Last Vitals:  Vitals Value Taken Time  BP 106/66 07/28/20 0858  Temp    Pulse 76 07/28/20 0900  Resp 15 07/28/20 0900  SpO2 98 % 07/28/20 0900  Vitals shown include unvalidated device data.  Last Pain:  Vitals:   07/28/20 0706  TempSrc: Oral  PainSc: 0-No pain      Patients Stated Pain Goal: 5 (15/18/34 3735)  Complications: No complications documented.

## 2020-07-28 NOTE — Anesthesia Procedure Notes (Signed)
Procedure Name: LMA Insertion Date/Time: 07/28/2020 8:21 AM Performed by: Gwyndolyn Saxon, CRNA Pre-anesthesia Checklist: Patient identified, Emergency Drugs available, Suction available and Patient being monitored Patient Re-evaluated:Patient Re-evaluated prior to induction Oxygen Delivery Method: Circle system utilized Preoxygenation: Pre-oxygenation with 100% oxygen Induction Type: IV induction Ventilation: Mask ventilation without difficulty LMA: LMA inserted LMA Size: 4.0 Number of attempts: 1 Placement Confirmation: positive ETCO2 and breath sounds checked- equal and bilateral Tube secured with: Tape Dental Injury: Teeth and Oropharynx as per pre-operative assessment

## 2020-07-28 NOTE — Anesthesia Postprocedure Evaluation (Signed)
Anesthesia Post Note  Patient: Donna Ramsey  Procedure(s) Performed: LEFT KNEE ARTHROSCOPY WITH MEDIAL MENISECTOMY (Left Knee) LEFT KNEE ARTHROSCOPY WITH LATERAL MENISECTOMY (Left Knee) CHONDROPLASTY LEFT KNEE (Left Knee)     Patient location during evaluation: PACU Anesthesia Type: General Level of consciousness: awake and alert Pain management: pain level controlled Vital Signs Assessment: post-procedure vital signs reviewed and stable Respiratory status: spontaneous breathing, nonlabored ventilation and respiratory function stable Cardiovascular status: blood pressure returned to baseline and stable Postop Assessment: no apparent nausea or vomiting Anesthetic complications: no   No complications documented.  Last Vitals:  Vitals:   07/28/20 0945 07/28/20 1006  BP: 119/79 119/79  Pulse: 68 68  Resp:  16  Temp:  36.5 C  SpO2: 97% 97%    Last Pain:  Vitals:   07/28/20 1006  TempSrc: Oral  PainSc: 2                  Lynda Rainwater

## 2020-07-28 NOTE — Discharge Instructions (Signed)
  Post Anesthesia Home Care Instructions  Activity: Get plenty of rest for the remainder of the day. A responsible individual must stay with you for 24 hours following the procedure.  For the next 24 hours, DO NOT: -Drive a car -Paediatric nurse -Drink alcoholic beverages -Take any medication unless instructed by your physician -Make any legal decisions or sign important papers.  Meals: Start with liquid foods such as gelatin or soup. Progress to regular foods as tolerated. Avoid greasy, spicy, heavy foods. If nausea and/or vomiting occur, drink only clear liquids until the nausea and/or vomiting subsides. Call your physician if vomiting continues.  Special Instructions/Symptoms: Your throat may feel dry or sore from the anesthesia or the breathing tube placed in your throat during surgery. If this causes discomfort, gargle with warm salt water. The discomfort should disappear within 24 hours.  *May have Ibuprofen at 4:45pm today

## 2020-07-28 NOTE — Op Note (Signed)
Orthopaedic Surgery Operative Note (CSN: [id number])  Donna Ramsey  [birth year] Date of Surgery: 07/28/2020   Diagnoses:  Left knee medial lateral meniscus tears with moderate arthritis  Procedure: Left medial lateral meniscectomies and medial and lateral chondroplasty   Operative Finding Exam under anesthesia: Full motion no limitation no instability Suprapatellar pouch: No loose bodies Patellofemoral Compartment: Grade 4 cartilage loss on the inferior and medial lateral portions of the inferior patella, grade 3 changes in the trochlea Medial Compartment: Diffuse areas of grade 4 medial femoral condyle cartilage loss, degenerative posterior medial meniscus tear noted, grade 3 changes in the tibia Lateral Compartment: Grade 2 changes on the tibia, area of grade 3 changes in the lateral femoral condyle, degenerative posterior lateral meniscus tear debrided back to stable base. Intercondylar Notch: Normal though there are osteophytes present  Successful completion of the planned procedure.  Patient had more arthritis than I was expecting but she did have mechanical symptoms preop.  If she does not make progress at this should be a good candidate for total knee arthroplasty for viscosupplementation as we do not feel that there is additional arthroscopic needs in this patient.  We worry that she will have stiffness and pain and we may proceed with therapy if the patient will allow.  She does have a high risk of going on to a total knee arthroplasty.  Post-operative plan: The patient will be weightbearing to tolerance.  The patient will be discharged home.  DVT prophylaxis Aspirin 81 mg twice daily for 6 weeks.  Pain control with PRN pain medication preferring oral medicines.  Follow up plan will be scheduled in approximately 7 days for incision check.  Post-Op Diagnosis: Same Surgeons:Primary: Hiram Gash, MD Assistants:Caroline McBane PA-C Location: [place] OR ROOM 6 Anesthesia: General with  local Antibiotics: Vancomycin Tourniquet time: * No tourniquets in log * Estimated Blood Loss: Minimal Complications: None Specimens: None Implants: * No implants in log *  Indications for Surgery:   Donna Ramsey is a 69 y.o. female with continued mechanical symptoms and left knee pain in the setting of known mild to moderate arthritis based on MRI and x-ray.  She still had good joint space on x-ray.  We did discuss that if she had failure her arthritis may be contributing to her symptoms.  Benefits and risks of operative and nonoperative management were discussed prior to surgery with patient/guardian(s) and informed consent form was completed.  Specific risks including infection, need for additional surgery, continued pain, post meniscectomy syndrome, continued arthrosis amongst others   Procedure:   The patient was identified properly. Informed consent was obtained and the surgical site was marked. The patient was taken up to suite where general anesthesia was induced. The patient was placed in the supine position with a post against the surgical leg and a nonsterile tourniquet applied. The surgical leg was then prepped and draped usual sterile fashion.  A standard surgical timeout was performed.  2 standard anterior portals were made and diagnostic arthroscopy performed. Please note the findings as noted above.  We began by clearing the joint as above.  Gentle chondroplasty performed of the medial and lateral femoral condyles using a shaver back to a stable base.  We then identified medial lateral meniscus tears as above and performed a gentle meniscectomy back to stable base removing about 40% of the meniscal volume on the medial side about 30% on the lateral side.  Patient significant arthritic changes but the meniscus appear to be stable at this point.  Incisions closed with absorbable suture. The patient was awoken from general anesthesia and taken to the PACU in stable condition without  complication.   Noemi Chapel, PA-C, present and scrubbed throughout the case, critical for completion in a timely fashion, and for retraction, instrumentation, closure.

## 2020-07-28 NOTE — Anesthesia Preprocedure Evaluation (Signed)
Anesthesia Evaluation  Patient identified by MRN, date of birth, ID band Patient awake    Reviewed: Allergy & Precautions, NPO status , Patient's Chart, lab work & pertinent test results  Airway Mallampati: II  TM Distance: >3 FB Neck ROM: Full    Dental no notable dental hx.    Pulmonary neg pulmonary ROS,    Pulmonary exam normal breath sounds clear to auscultation       Cardiovascular negative cardio ROS Normal cardiovascular exam Rhythm:Regular Rate:Normal     Neuro/Psych negative neurological ROS  negative psych ROS   GI/Hepatic Neg liver ROS, GERD  ,  Endo/Other  negative endocrine ROS  Renal/GU negative Renal ROS  negative genitourinary   Musculoskeletal  (+) Arthritis , Osteoarthritis,    Abdominal   Peds negative pediatric ROS (+)  Hematology negative hematology ROS (+)   Anesthesia Other Findings Monoclonal Gammopathy Mild Portal Hypertension  Reproductive/Obstetrics negative OB ROS                             Anesthesia Physical Anesthesia Plan  ASA: III  Anesthesia Plan: General   Post-op Pain Management:    Induction: Intravenous  PONV Risk Score and Plan: 3 and Ondansetron, Dexamethasone, Midazolam and Treatment may vary due to age or medical condition  Airway Management Planned: LMA  Additional Equipment:   Intra-op Plan:   Post-operative Plan: Extubation in OR  Informed Consent: I have reviewed the patients History and Physical, chart, labs and discussed the procedure including the risks, benefits and alternatives for the proposed anesthesia with the patient or authorized representative who has indicated his/her understanding and acceptance.     Dental advisory given  Plan Discussed with: CRNA  Anesthesia Plan Comments:         Anesthesia Quick Evaluation

## 2020-07-29 ENCOUNTER — Encounter (HOSPITAL_BASED_OUTPATIENT_CLINIC_OR_DEPARTMENT_OTHER): Payer: Self-pay | Admitting: Orthopaedic Surgery

## 2020-08-06 DIAGNOSIS — Z23 Encounter for immunization: Secondary | ICD-10-CM | POA: Diagnosis not present

## 2020-09-09 DIAGNOSIS — M170 Bilateral primary osteoarthritis of knee: Secondary | ICD-10-CM | POA: Diagnosis not present

## 2020-09-10 DIAGNOSIS — Z23 Encounter for immunization: Secondary | ICD-10-CM | POA: Diagnosis not present

## 2020-10-19 DIAGNOSIS — N812 Incomplete uterovaginal prolapse: Secondary | ICD-10-CM | POA: Diagnosis not present

## 2020-11-01 DIAGNOSIS — K7469 Other cirrhosis of liver: Secondary | ICD-10-CM | POA: Diagnosis not present

## 2020-11-01 DIAGNOSIS — Z9081 Acquired absence of spleen: Secondary | ICD-10-CM | POA: Diagnosis not present

## 2020-11-01 DIAGNOSIS — D472 Monoclonal gammopathy: Secondary | ICD-10-CM | POA: Diagnosis not present

## 2020-11-01 DIAGNOSIS — K7460 Unspecified cirrhosis of liver: Secondary | ICD-10-CM | POA: Diagnosis not present

## 2020-11-01 DIAGNOSIS — Q8909 Congenital malformations of spleen: Secondary | ICD-10-CM | POA: Diagnosis not present

## 2020-11-04 DIAGNOSIS — Z9081 Acquired absence of spleen: Secondary | ICD-10-CM | POA: Diagnosis not present

## 2020-11-04 DIAGNOSIS — D472 Monoclonal gammopathy: Secondary | ICD-10-CM | POA: Diagnosis not present

## 2020-11-04 DIAGNOSIS — K766 Portal hypertension: Secondary | ICD-10-CM | POA: Diagnosis not present

## 2020-11-04 DIAGNOSIS — Q8901 Asplenia (congenital): Secondary | ICD-10-CM | POA: Diagnosis not present

## 2020-11-15 ENCOUNTER — Other Ambulatory Visit: Payer: Self-pay

## 2020-11-15 ENCOUNTER — Ambulatory Visit (INDEPENDENT_AMBULATORY_CARE_PROVIDER_SITE_OTHER): Payer: Medicare Other | Admitting: Sports Medicine

## 2020-11-15 DIAGNOSIS — M79672 Pain in left foot: Secondary | ICD-10-CM | POA: Diagnosis not present

## 2020-11-15 NOTE — Assessment & Plan Note (Signed)
This is a very pleasant 69 year old female, she has been having pain in her left foot at the 4th MTP for several weeks now, she walks about 3 miles a day. On exam she has swelling, synovitis, pain at the 4th MTP on the left, she has been applying topical diclofenac, so she is getting somewhat of a rash on her skin from this. We are going to start conservatively, I buddy taped her 3rd and 4th toes together, she has some meloxicam that she will go ahead and start taking daily with food for 2 weeks, and cut her mileage back to approximately 1-1/2. Continue custom orthotics. Return to see me in about 2 weeks, if persistent discomfort we will consider injection into the left 4th MTP.

## 2020-11-15 NOTE — Progress Notes (Signed)
    Procedures performed today:    None.  Independent interpretation of notes and tests performed by another provider:   X-rays personally reviewed, there is widespread osteoarthritis.  Brief History, Exam, Impression, and Recommendations:    Left foot pain This is a very pleasant 69 year old female, she has been having pain in her left foot at the 4th MTP for several weeks now, she walks about 3 miles a day. On exam she has swelling, synovitis, pain at the 4th MTP on the left, she has been applying topical diclofenac, so she is getting somewhat of a rash on her skin from this. We are going to start conservatively, I buddy taped her 3rd and 4th toes together, she has some meloxicam that she will go ahead and start taking daily with food for 2 weeks, and cut her mileage back to approximately 1-1/2. Continue custom orthotics. Return to see me in about 2 weeks, if persistent discomfort we will consider injection into the left 4th MTP.    ___________________________________________ Gwen Her. Dianah Field, M.D., ABFM., CAQSM. Primary Care and [place] Instructor of [place] of [facility] of Medicine

## 2020-11-16 DIAGNOSIS — N812 Incomplete uterovaginal prolapse: Secondary | ICD-10-CM | POA: Diagnosis not present

## 2020-12-08 ENCOUNTER — Ambulatory Visit (INDEPENDENT_AMBULATORY_CARE_PROVIDER_SITE_OTHER): Payer: Medicare Other

## 2020-12-08 ENCOUNTER — Other Ambulatory Visit: Payer: Self-pay

## 2020-12-08 ENCOUNTER — Ambulatory Visit (INDEPENDENT_AMBULATORY_CARE_PROVIDER_SITE_OTHER): Payer: Medicare Other | Admitting: Sports Medicine

## 2020-12-08 DIAGNOSIS — M79672 Pain in left foot: Secondary | ICD-10-CM

## 2020-12-08 NOTE — Assessment & Plan Note (Signed)
Persistent pain and synovitis of the left fourth MTP, orthotics not effective, today we injected her left fourth MTP. X-rays historically just shown degenerative changes. Return to see me in a month.

## 2020-12-08 NOTE — Progress Notes (Signed)
° ° °  Procedures performed today:    Procedure: Real-time Ultrasound Guided injection of the left fourth MTP Device: Samsung HS60  Verbal informed consent obtained.  Time-out conducted.  Noted no overlying erythema, induration, or other signs of local infection.  Skin prepped in a sterile fashion.  Local anesthesia: Topical Ethyl chloride.  With sterile technique and under real time ultrasound guidance:  1 of cc lidocaine, 1/2 cc kenalog 40 injected easily  completed without difficulty  Advised to call if fevers/chills, erythema, induration, drainage, or persistent bleeding.  Images permanently stored and available for review in PACS.  Impression: Technically successful ultrasound guided injection.  Independent interpretation of notes and tests performed by another provider:   None.  Brief History, Exam, Impression, and Recommendations:    Left foot pain Persistent pain and synovitis of the left fourth MTP, orthotics not effective, today we injected her left fourth MTP. X-rays historically just shown degenerative changes. Return to see me in a month.    ___________________________________________ Gwen Her. Dianah Field, M.D., ABFM., CAQSM. Primary Care and [place] Instructor of [place] of [facility] of Medicine

## 2020-12-14 DIAGNOSIS — K739 Chronic hepatitis, unspecified: Secondary | ICD-10-CM | POA: Diagnosis not present

## 2020-12-14 DIAGNOSIS — K738 Other chronic hepatitis, not elsewhere classified: Secondary | ICD-10-CM | POA: Diagnosis not present

## 2020-12-14 DIAGNOSIS — K7460 Unspecified cirrhosis of liver: Secondary | ICD-10-CM | POA: Diagnosis not present

## 2020-12-14 DIAGNOSIS — K7400 Hepatic fibrosis, unspecified: Secondary | ICD-10-CM | POA: Diagnosis not present

## 2020-12-15 ENCOUNTER — Encounter: Payer: Self-pay | Admitting: Family Medicine

## 2020-12-15 DIAGNOSIS — Z889 Allergy status to unspecified drugs, medicaments and biological substances status: Secondary | ICD-10-CM

## 2020-12-20 ENCOUNTER — Other Ambulatory Visit: Payer: Self-pay

## 2021-01-31 ENCOUNTER — Telehealth: Payer: Self-pay | Admitting: Sports Medicine

## 2021-01-31 DIAGNOSIS — M25562 Pain in left knee: Secondary | ICD-10-CM | POA: Diagnosis not present

## 2021-01-31 NOTE — Telephone Encounter (Signed)
Lets work on [name], she has had multiple injections, arthroscopic surgery.  Left knee.  X-ray confirmed.

## 2021-02-02 NOTE — Telephone Encounter (Signed)
Submitted Orthovisc 02/01/21 waiting on BID and to see if Pa is required. - CF

## 2021-02-02 NOTE — Telephone Encounter (Signed)
Received BID patient is covered 100% I called her and she is scheduled starting next week for Left Knee Orthovisc - CF

## 2021-02-08 ENCOUNTER — Ambulatory Visit (INDEPENDENT_AMBULATORY_CARE_PROVIDER_SITE_OTHER): Payer: Medicare Other

## 2021-02-08 ENCOUNTER — Ambulatory Visit (INDEPENDENT_AMBULATORY_CARE_PROVIDER_SITE_OTHER): Payer: Medicare Other | Admitting: Sports Medicine

## 2021-02-08 ENCOUNTER — Other Ambulatory Visit: Payer: Self-pay

## 2021-02-08 DIAGNOSIS — M1712 Unilateral primary osteoarthritis, left knee: Secondary | ICD-10-CM | POA: Diagnosis not present

## 2021-02-08 NOTE — Addendum Note (Signed)
Addended by: Dema Severin on: 02/08/2021 11:22 AM   Modules accepted: Orders

## 2021-02-08 NOTE — Progress Notes (Signed)
    Procedures performed today:    Procedure: Real-time Ultrasound Guided injection of the left knee Device: Samsung HS60  Verbal informed consent obtained.  Time-out conducted.  Noted no overlying erythema, induration, or other signs of local infection.  Skin prepped in a sterile fashion.  Local anesthesia: Topical Ethyl chloride.  With sterile technique and under real time ultrasound guidance:  Noted minimal effusion, 30 mg/2 mL of OrthoVisc (sodium hyaluronate) in a prefilled syringe was injected easily into the knee through a 22-gauge needle.   Completed without difficulty  Advised to call if fevers/chills, erythema, induration, drainage, or persistent bleeding.  Images permanently stored and available for review in PACS.  Impression: Technically successful ultrasound guided injection.  Independent interpretation of notes and tests performed by another provider:   None.  Brief History, Exam, Impression, and Recommendations:    Primary osteoarthritis of left knee This pleasant 70 year old female returns, she has had multiple modalities of treatment for her knee osteoarthritis including steroid injections, activity modification. She has had some draining and injections of her Baker's cyst. We are going to proceed out with viscosupplementation, Orthovisc No. 1 of 4. Return to see me in 1 week for #2 of 4.     ___________________________________________ Gwen Her. Dianah Field, M.D., ABFM., CAQSM. Primary Care and [place] Instructor of [place] of [facility] of Medicine

## 2021-02-08 NOTE — Assessment & Plan Note (Signed)
This pleasant 70 year old female returns, she has had multiple modalities of treatment for her knee osteoarthritis including steroid injections, activity modification. She has had some draining and injections of her Baker's cyst. We are going to proceed out with viscosupplementation, Orthovisc No. 1 of 4. Return to see me in 1 week for #2 of 4.

## 2021-02-15 ENCOUNTER — Other Ambulatory Visit: Payer: Self-pay

## 2021-02-15 ENCOUNTER — Ambulatory Visit (INDEPENDENT_AMBULATORY_CARE_PROVIDER_SITE_OTHER): Payer: Medicare Other

## 2021-02-15 ENCOUNTER — Ambulatory Visit (INDEPENDENT_AMBULATORY_CARE_PROVIDER_SITE_OTHER): Payer: Medicare Other | Admitting: Sports Medicine

## 2021-02-15 DIAGNOSIS — M1712 Unilateral primary osteoarthritis, left knee: Secondary | ICD-10-CM | POA: Diagnosis not present

## 2021-02-15 NOTE — Assessment & Plan Note (Signed)
Orthovisc No. 2 of four into the left knee, return in 1 week for #3 of four.

## 2021-02-15 NOTE — Progress Notes (Signed)
    Procedures performed today:    Procedure: Real-time Ultrasound Guidedinjection of the left knee Device: Samsung HS60  Verbal informed consent obtained.  Time-out conducted.  Noted no overlying erythema, induration, or other signs of local infection.  Skin prepped in a sterile fashion.  Local anesthesia: Topical Ethyl chloride.  With sterile technique and under real time ultrasound guidance: Noted minimal effusion, 30 mg/2 mL of OrthoVisc (sodium hyaluronate) in a prefilled syringe was injected easily into the knee through a 22-gauge needle.   Completed without difficulty  Advised to call if fevers/chills, erythema, induration, drainage, or persistent bleeding.  Images permanently stored and available for review in PACS.  Impression: Technically successful ultrasound guided injection.  Independent interpretation of notes and tests performed by another provider:   None.  Brief History, Exam, Impression, and Recommendations:    Primary osteoarthritis of left knee Orthovisc No. 2 of four into the left knee, return in 1 week for #3 of four.    ___________________________________________ Gwen Her. Dianah Field, M.D., ABFM., CAQSM. Primary Care and [place] Instructor of [place] of [facility] of Medicine

## 2021-02-22 ENCOUNTER — Ambulatory Visit (INDEPENDENT_AMBULATORY_CARE_PROVIDER_SITE_OTHER): Payer: Medicare Other

## 2021-02-22 ENCOUNTER — Ambulatory Visit (INDEPENDENT_AMBULATORY_CARE_PROVIDER_SITE_OTHER): Payer: Medicare Other | Admitting: Family Medicine

## 2021-02-22 ENCOUNTER — Encounter: Payer: Self-pay | Admitting: Family Medicine

## 2021-02-22 ENCOUNTER — Ambulatory Visit (INDEPENDENT_AMBULATORY_CARE_PROVIDER_SITE_OTHER): Payer: Medicare Other | Admitting: Sports Medicine

## 2021-02-22 ENCOUNTER — Other Ambulatory Visit: Payer: Self-pay

## 2021-02-22 VITALS — BP 126/82 | HR 64 | Wt 165.4 lb

## 2021-02-22 DIAGNOSIS — N3281 Overactive bladder: Secondary | ICD-10-CM | POA: Diagnosis not present

## 2021-02-22 DIAGNOSIS — M1712 Unilateral primary osteoarthritis, left knee: Secondary | ICD-10-CM | POA: Diagnosis not present

## 2021-02-22 LAB — POCT URINALYSIS DIPSTICK
Bilirubin, UA: NEGATIVE
Blood, UA: NEGATIVE
Glucose, UA: NEGATIVE
Ketones, UA: NEGATIVE
Leukocytes, UA: NEGATIVE
Nitrite, UA: NEGATIVE
Protein, UA: NEGATIVE
Spec Grav, UA: 1.01 (ref 1.010–1.025)
Urobilinogen, UA: 0.2 E.U./dL
pH, UA: 7 (ref 5.0–8.0)

## 2021-02-22 MED ORDER — MIRABEGRON ER 25 MG PO TB24: 25.0000 mg | ORAL_TABLET | Freq: Every day | ORAL | 6 refills | Status: DC

## 2021-02-22 NOTE — Progress Notes (Signed)
Donna Ramsey - 70 y.o. female MRN [id number]  Date of birth: 1951-07-02  Subjective Chief Complaint  Patient presents with  . overactive bladder    HPI Donna Ramsey is a 70 y.o. female here today with complaint of urinary frequency.  She feels like she has OAB.  Has sudden urge to urinate throughout the day.  Some incontinence if not close to a restroom.  She did see gynecology previously and was told that her bladder had "dropped".  She did try a pessary however this would not stay in and was uncomfortable.  She is waking frequently at night as well to go to the restroom.  She denies dysuria, flank pain, fever, chills.  She has not tolerated anticholinergic medications well previously due to her chronic dry mouth.  ROS:  A comprehensive ROS was completed and negative except as noted per HPI  Allergies  Allergen Reactions  . Cephalosporins Hives  . Cephapirin Hives  . Clindamycin Hives  . Codeine Hives and Rash  . Penicillins Hives, Other (See Comments) and Rash    Hives   . Pentazocine Hives  . Levofloxacin Rash and Other (See Comments)    tendonitis   . Neomycin Itching  . Quinolones Other (See Comments)    Achilles Pain  . Sulfa Antibiotics Rash    Past Medical History:  Diagnosis Date  . Asplenia   . Family history of breast cancer   . GERD (gastroesophageal reflux disease)   . Liver disease, unspecified    heterogenous echotexture  . Portal hypertension ([place])   . Splenic infarct     Past Surgical History:  Procedure Laterality Date  . ABDOMINAL HYSTERECTOMY    . BREAST BIOPSY Bilateral 50 yrs ago   benign  . CHONDROPLASTY Left 07/28/2020   Procedure: CHONDROPLASTY LEFT KNEE;  Surgeon: Hiram Gash, MD;  Location: [facility];  Service: Orthopedics;  Laterality: Left;  . FOOT SURGERY    . HERNIA REPAIR    . KNEE ARTHROSCOPY WITH LATERAL MENISECTOMY Left 07/28/2020   Procedure: LEFT KNEE ARTHROSCOPY WITH LATERAL MENISECTOMY;  Surgeon: Hiram Gash, MD;  Location: [facility];  Service: Orthopedics;  Laterality: Left;  . KNEE ARTHROSCOPY WITH MEDIAL MENISECTOMY Left 07/28/2020   Procedure: LEFT KNEE ARTHROSCOPY WITH MEDIAL MENISECTOMY;  Surgeon: Hiram Gash, MD;  Location: [facility];  Service: Orthopedics;  Laterality: Left;  . SPLENECTOMY, TOTAL      Social History   Socioeconomic History  . Marital status: Widowed    Spouse name: Not on file  . Number of children: Not on file  . Years of education: Not on file  . Highest education level: Not on file  Occupational History  . Not on file  Tobacco Use  . Smoking status: Never Smoker  . Smokeless tobacco: Never Used  Substance and Sexual Activity  . Alcohol use: Yes    Comment: occ  . Drug use: Never  . Sexual activity: Never  Other Topics Concern  . Not on file  Social History Narrative  . Not on file   Social Determinants of Health   Financial Resource Strain: Not on file  Food Insecurity: Not on file  Transportation Needs: Not on file  Physical Activity: Not on file  Stress: Not on file  Social Connections: Not on file    Family History  Problem Relation Age of Onset  . Breast cancer Mother 32  . Stroke Mother   . Hypertension Father   .  Heart attack Father   . Diabetes Sister   . Breast cancer Paternal Aunt   . Breast cancer Paternal Grandmother        dx 17s  . Rheum arthritis Daughter     Health Maintenance  Topic Date Due  . Hepatitis C Screening  Never done  . Meningococcal B Vaccine (1 of 4 - Increased Risk Bexsero 2-dose series) Never done  . DEXA SCAN  Never done  . PNA vac Low Risk Adult (2 of 2 - PPSV23) 09/14/2018  . COVID-19 Vaccine (3 - Pfizer risk 4-dose series) 03/10/2020  . TETANUS/TDAP  03/29/2021  . MAMMOGRAM  06/06/2022  . COLONOSCOPY (Pts 45-106yrs Insurance coverage will need to be confirmed)  04/11/2028  . INFLUENZA VACCINE  Completed  . HPV VACCINES  Aged Out      ----------------------------------------------------------------------------------------------------------------------------------------------------------------------------------------------------------------- Physical Exam BP 126/82 (BP Location: Left Arm, Patient Position: Sitting, Cuff Size: Normal)   Pulse 64   Wt 165 lb 6.4 oz (75 kg)   SpO2 100%   BMI 24.08 kg/m   Physical Exam Constitutional:      Appearance: Normal appearance.  HENT:     Head: Normocephalic and atraumatic.  Neurological:     General: No focal deficit present.     Mental Status: She is alert.  Psychiatric:        Mood and Affect: Mood normal.        Behavior: Behavior normal.     ------------------------------------------------------------------------------------------------------------------------------------------------------------------------------------------------------------------- Assessment and Plan  OAB (overactive bladder) UA normal.  Will provide trial of myrbetriq.  Recommend that she monitor BP at home when starting this.  She does have what sounds like cystocele that is contributing.  May need to see urogynecology if not improving with medication.     Meds ordered this encounter  Medications  . mirabegron ER (MYRBETRIQ) 25 MG TB24 tablet    Sig: Take 1 tablet (25 mg total) by mouth daily.    Dispense:  30 tablet    Refill:  6    No follow-ups on file.    This visit occurred during the SARS-CoV-2 public health emergency.  Safety protocols were in place, including screening questions prior to the visit, additional usage of staff PPE, and extensive cleaning of exam room while observing appropriate contact time as indicated for disinfecting solutions.

## 2021-02-22 NOTE — Patient Instructions (Signed)
Overactive Bladder, Adult  Overactive bladder is a condition in which a person has a sudden and frequent need to urinate. A person might also leak urine if he or she cannot get to the bathroom fast enough (urinary incontinence). Sometimes, symptoms can interfere with work or social activities. What are the causes? Overactive bladder is associated with poor nerve signals between your bladder and your brain. Your bladder may get the signal to empty before it is full. You may also have very sensitive muscles that make your bladder squeeze too soon. This condition may also be caused by other factors, such as:  Medical conditions: ? Urinary tract infection. ? Infection of nearby tissues. ? Prostate enlargement. ? Bladder stones, inflammation, or tumors. ? Diabetes. ? Muscle or nerve weakness, especially from these conditions:  A spinal cord injury.  Stroke.  Multiple sclerosis.  Parkinson's disease.  Other causes: ? Surgery on the uterus or urethra. ? Drinking too much caffeine or alcohol. ? Certain medicines, especially those that eliminate extra fluid in the body (diuretics). ? Constipation. What increases the risk? You may be at greater risk for overactive bladder if you:  Are an older adult.  Smoke.  Are going through menopause.  Have prostate problems.  Have a neurological disease, such as stroke, dementia, Parkinson's disease, or multiple sclerosis (MS).  Eat or drink alcohol, spicy food, caffeine, and other things that irritate the bladder.  Are overweight or obese. What are the signs or symptoms? Symptoms of this condition include a sudden, strong urge to urinate. Other symptoms include:  Leaking urine.  Urinating 8 or more times a day.  Waking up to urinate 2 or more times overnight. How is this diagnosed? This condition may be diagnosed based on:  Your symptoms and medical history.  A physical exam.  Blood or urine tests to check for possible causes,  such as infection. You may also need to see a health care provider who specializes in urinary tract problems. This is called a urologist. How is this treated? Treatment for overactive bladder depends on the cause of your condition and whether it is mild or severe. Treatment may include:  Bladder training, such as: ? Learning to control the urge to urinate by following a schedule to urinate at regular intervals. ? Doing Kegel exercises to strengthen the pelvic floor muscles that support your bladder.  Special devices, such as: ? Biofeedback. This uses sensors to help you become aware of your body's signals. ? Electrical stimulation. This uses electrodes placed inside the body (implanted) or outside the body. These electrodes send gentle pulses of electricity to strengthen the nerves or muscles that control the bladder. ? Women may use a plastic device, called a pessary, that fits into the vagina and supports the bladder.  Medicines, such as: ? Antibiotics to treat bladder infection. ? Antispasmodics to stop the bladder from releasing urine at the wrong time. ? Tricyclic antidepressants to relax bladder muscles. ? Injections of botulinum toxin type A directly into the bladder tissue to relax bladder muscles.  Surgery, such as: ? A device may be implanted to help manage the nerve signals that control urination. ? An electrode may be implanted to stimulate electrical signals in the bladder. ? A procedure may be done to change the shape of the bladder. This is done only in very severe cases. Follow these instructions at home: Eating and drinking  Make diet or lifestyle changes recommended by your health care provider. These may include: ? Drinking fluids   throughout the day and not only with meals. ? Cutting down on caffeine or alcohol. ? Eating a healthy and balanced diet to prevent constipation. This may include:  Choosing foods that are high in fiber, such as beans, whole grains, and  fresh fruits and vegetables.  Limiting foods that are high in fat and processed sugars, such as fried and sweet foods.   Lifestyle  Lose weight if needed.  Do not use any products that contain nicotine or tobacco. These include cigarettes, chewing tobacco, and vaping devices, such as e-cigarettes. If you need help quitting, ask your health care provider.   General instructions  Take over-the-counter and prescription medicines only as told by your health care provider.  If you were prescribed an antibiotic medicine, take it as told by your health care provider. Do not stop taking the antibiotic even if you start to feel better.  Use any implants or pessary as told by your health care provider.  If needed, wear pads to absorb urine leakage.  Keep a log to track how much and when you drink, and when you need to urinate. This will help your health care provider monitor your condition.  Keep all follow-up visits. This is important. Contact a health care provider if:  You have a fever or chills.  Your symptoms do not get better with treatment.  Your pain and discomfort get worse.  You have more frequent urges to urinate. Get help right away if:  You are not able to control your bladder. Summary  Overactive bladder refers to a condition in which a person has a sudden and frequent need to urinate.  Several conditions may lead to an overactive bladder.  Treatment for overactive bladder depends on the cause and severity of your condition.  Making lifestyle changes, doing Kegel exercises, keeping a log, and taking medicines can help with this condition. This information is not intended to replace advice given to you by your health care provider. Make sure you discuss any questions you have with your health care provider. Document Revised: 08/22/2020 Document Reviewed: 08/22/2020 Elsevier Patient Education  2021 Elsevier Inc.  

## 2021-02-22 NOTE — Assessment & Plan Note (Signed)
Orthovisc No. 3 of 4 into the left knee, return in 1 week for #4 of 4.

## 2021-02-22 NOTE — Progress Notes (Signed)
    Procedures performed today:    Procedure: Real-time Ultrasound Guidedinjection of theleft knee Device: Samsung HS60  Verbal informed consent obtained.  Time-out conducted.  Noted no overlying erythema, induration, or other signs of local infection.  Skin prepped in a sterile fashion.  Local anesthesia: Topical Ethyl chloride.  With sterile technique and under real time ultrasound guidance:Noted minimal effusion, 30 mg/2 mL of OrthoVisc (sodium hyaluronate) in a prefilled syringe was injected easily into the knee through a 22-gauge needle.  Completed without difficulty  Advised to call if fevers/chills, erythema, induration, drainage, or persistent bleeding.  Images permanently stored and available for review in PACS.  Impression: Technically successful ultrasound guided injection.  Independent interpretation of notes and tests performed by another provider:   None.  Brief History, Exam, Impression, and Recommendations:    Primary osteoarthritis of left knee Orthovisc No. 3 of 4 into the left knee, return in 1 week for #4 of 4.    ___________________________________________ Gwen Her. Dianah Field, M.D., ABFM., CAQSM. Primary Care and [place] Instructor of [place] of [facility] of Medicine

## 2021-02-22 NOTE — Assessment & Plan Note (Addendum)
UA normal.  Will provide trial of myrbetriq.  Recommend that she monitor BP at home when starting this.  She does have what sounds like cystocele that is contributing.  May need to see urogynecology if not improving with medication.

## 2021-02-27 DIAGNOSIS — K7400 Hepatic fibrosis, unspecified: Secondary | ICD-10-CM | POA: Diagnosis not present

## 2021-02-27 DIAGNOSIS — K766 Portal hypertension: Secondary | ICD-10-CM | POA: Diagnosis not present

## 2021-02-27 DIAGNOSIS — Z7289 Other problems related to lifestyle: Secondary | ICD-10-CM | POA: Diagnosis not present

## 2021-02-27 DIAGNOSIS — K741 Hepatic sclerosis: Secondary | ICD-10-CM | POA: Diagnosis not present

## 2021-02-27 DIAGNOSIS — K7469 Other cirrhosis of liver: Secondary | ICD-10-CM | POA: Diagnosis not present

## 2021-03-01 ENCOUNTER — Other Ambulatory Visit: Payer: Self-pay

## 2021-03-01 ENCOUNTER — Ambulatory Visit (INDEPENDENT_AMBULATORY_CARE_PROVIDER_SITE_OTHER): Payer: Medicare Other

## 2021-03-01 ENCOUNTER — Ambulatory Visit (INDEPENDENT_AMBULATORY_CARE_PROVIDER_SITE_OTHER): Payer: Medicare Other | Admitting: Sports Medicine

## 2021-03-01 DIAGNOSIS — M79672 Pain in left foot: Secondary | ICD-10-CM | POA: Diagnosis not present

## 2021-03-01 DIAGNOSIS — M1712 Unilateral primary osteoarthritis, left knee: Secondary | ICD-10-CM

## 2021-03-01 NOTE — Assessment & Plan Note (Signed)
Orthovisc No. 4 of 4 left knee, return as needed.

## 2021-03-01 NOTE — Progress Notes (Signed)
    Procedures performed today:    Procedure: Real-time Ultrasound Guidedinjection of theleft knee Device: Samsung HS60  Verbal informed consent obtained.  Time-out conducted.  Noted no overlying erythema, induration, or other signs of local infection.  Skin prepped in a sterile fashion.  Local anesthesia: Topical Ethyl chloride.  With sterile technique and under real time ultrasound guidance:Noted minimal effusion, 30 mg/2 mL of OrthoVisc (sodium hyaluronate) in a prefilled syringe was injected easily into the knee through a 22-gauge needle.  Completed without difficulty  Advised to call if fevers/chills, erythema, induration, drainage, or persistent bleeding.  Images permanently stored and available for review in PACS.  Impression: Technically successful ultrasound guided injection.  Procedure: Real-time Ultrasound Guided injection of the left fourth metatarsophalangeal joint Device: Samsung HS60  Verbal informed consent obtained.  Time-out conducted.  Noted no overlying erythema, induration, or other signs of local infection.  Skin prepped in a sterile fashion.  Local anesthesia: Topical Ethyl chloride.  With sterile technique and under real time ultrasound guidance:  0.5 cc kenalog 40, 0.5 cc lidocaine injected easily, minimal synovitis was noted.   Completed without difficulty  Advised to call if fevers/chills, erythema, induration, drainage, or persistent bleeding.  Images permanently stored and available for review in PACS.  Impression: Technically successful ultrasound guided injection.  Independent interpretation of notes and tests performed by another provider:   None.  Brief History, Exam, Impression, and Recommendations:    Primary osteoarthritis of left knee Orthovisc No. 4 of 4 left knee, return as needed.  Left foot pain Recurrence of pain and synovitis of the left first MTP. She has significant breakdown of the transverse arch and clawing of the toes with  abnormal callus. Orthotics were not sufficiently efficacious, 3 months ago we injected her left fourth MTP and she did well, repeated today due to recurrence of pain. She will bring her orthotics in a month and we will probably place a metatarsal pad behind the fourth MTP.    ___________________________________________ Gwen Her. Dianah Field, M.D., ABFM., CAQSM. Primary Care and [place] Instructor of [place] of [facility] of Medicine

## 2021-03-01 NOTE — Assessment & Plan Note (Signed)
Recurrence of pain and synovitis of the left first MTP. She has significant breakdown of the transverse arch and clawing of the toes with abnormal callus. Orthotics were not sufficiently efficacious, 3 months ago we injected her left fourth MTP and she did well, repeated today due to recurrence of pain. She will bring her orthotics in a month and we will probably place a metatarsal pad behind the fourth MTP.

## 2021-03-06 ENCOUNTER — Ambulatory Visit (INDEPENDENT_AMBULATORY_CARE_PROVIDER_SITE_OTHER): Payer: Medicare Other | Admitting: Family Medicine

## 2021-03-06 ENCOUNTER — Other Ambulatory Visit: Payer: Self-pay

## 2021-03-06 ENCOUNTER — Encounter: Payer: Self-pay | Admitting: Family Medicine

## 2021-03-06 VITALS — BP 130/87 | HR 77 | Temp 97.5°F | Wt 165.2 lb

## 2021-03-06 DIAGNOSIS — K439 Ventral hernia without obstruction or gangrene: Secondary | ICD-10-CM | POA: Diagnosis not present

## 2021-03-06 NOTE — Progress Notes (Signed)
Acute Office Visit  Subjective:    Patient ID: Donna Ramsey, female    DOB: [birth year], 69 y.o.   MRN: [id number]  Chief Complaint  Patient presents with  . Hernia    HPI Patient is in today for abdominal hernia.  This weekend she noticed a lower right abdominal/groin mass that she is concerned may be a hernia. She reports it is soft, not painful, easily reduces with light pressure. She reports the bulge is out most of the day with any activity (walking, standing, etc), but she can easily press it back in, although it'll pop back out with activity. She does not notice it much when she is sitting or lying down. She states it might be a little bit sore, but is not in any true pain. She has had no recent bowel or bladder changes. No skin changes, redness, rash, firm masses, etc. Yesterday, she wore an abdominal binder that she had from the past and states she was able to wear it low enough to apply light pressure and keep the hernia from bulging.  States she has recently been installing a fountain in her yard and has been lifting a lot of heavy stones and doing a lot of squatting/bending. She is planning to drive out to visit her children and their families in [place] today.     Past Medical History:  Diagnosis Date  . Asplenia   . Family history of breast cancer   . GERD (gastroesophageal reflux disease)   . Liver disease, unspecified    heterogenous echotexture  . Portal hypertension ([place])   . Splenic infarct     Past Surgical History:  Procedure Laterality Date  . ABDOMINAL HYSTERECTOMY    . BREAST BIOPSY Bilateral 50 yrs ago   benign  . CHONDROPLASTY Left 07/28/2020   Procedure: CHONDROPLASTY LEFT KNEE;  Surgeon: Hiram Gash, MD;  Location: [facility];  Service: Orthopedics;  Laterality: Left;  . FOOT SURGERY    . HERNIA REPAIR    . KNEE ARTHROSCOPY WITH LATERAL MENISECTOMY Left 07/28/2020   Procedure: LEFT KNEE ARTHROSCOPY WITH LATERAL MENISECTOMY;   Surgeon: Hiram Gash, MD;  Location: [facility];  Service: Orthopedics;  Laterality: Left;  . KNEE ARTHROSCOPY WITH MEDIAL MENISECTOMY Left 07/28/2020   Procedure: LEFT KNEE ARTHROSCOPY WITH MEDIAL MENISECTOMY;  Surgeon: Hiram Gash, MD;  Location: [facility];  Service: Orthopedics;  Laterality: Left;  . SPLENECTOMY, TOTAL      Family History  Problem Relation Age of Onset  . Breast cancer Mother 47  . Stroke Mother   . Hypertension Father   . Heart attack Father   . Diabetes Sister   . Breast cancer Paternal Aunt   . Breast cancer Paternal Grandmother        dx 67s  . Rheum arthritis Daughter     Social History   Socioeconomic History  . Marital status: Widowed    Spouse name: Not on file  . Number of children: Not on file  . Years of education: Not on file  . Highest education level: Not on file  Occupational History  . Not on file  Tobacco Use  . Smoking status: Never Smoker  . Smokeless tobacco: Never Used  Substance and Sexual Activity  . Alcohol use: Yes    Comment: occ  . Drug use: Never  . Sexual activity: Never  Other Topics Concern  . Not on file  Social History Narrative  . Not  on file   Social Determinants of Health   Financial Resource Strain: Not on file  Food Insecurity: Not on file  Transportation Needs: Not on file  Physical Activity: Not on file  Stress: Not on file  Social Connections: Not on file  Intimate Partner Violence: Not on file    Outpatient Medications Prior to Visit  Medication Sig Dispense Refill  . lansoprazole (PREVACID) 15 MG capsule Take 15 mg by mouth daily at 12 noon.    Marland Kitchen albuterol (VENTOLIN HFA) 108 (90 Base) MCG/ACT inhaler Inhale into the lungs as needed.    . ALPRAZolam (XANAX) 0.25 MG tablet Take by mouth as needed.    . Calcium Carbonate-Vit D-Min (CALCIUM 600+D PLUS MINERALS) 600-400 MG-UNIT TABS Take 1 tablet by mouth daily.    . cetirizine (ZYRTEC) 10 MG tablet Take 10 mg by  mouth as needed.    . famotidine-calcium carbonate-magnesium hydroxide (PEPCID COMPLETE) 10-800-165 MG chewable tablet Chew 1 tablet by mouth as needed.    Marland Kitchen Hyoscyamine Sulfate SL 0.125 MG SUBL Take by mouth as needed.    Marland Kitchen MILK THISTLE EXTRACT PO Take 400 mg by mouth as needed.    . Omega 3 1000 MG CAPS Take 2 capsules by mouth as needed.    . Probiotic Product (PRO-BIOTIC BLEND PO) as needed.    . triamcinolone cream (KENALOG) 0.1 % Apply 1 application topically as needed.     No facility-administered medications prior to visit.    Allergies  Allergen Reactions  . Cephalosporins Hives  . Cephapirin Hives  . Clindamycin Hives  . Codeine Hives and Rash  . Penicillins Hives, Other (See Comments) and Rash    Hives   . Pentazocine Hives  . Levofloxacin Rash and Other (See Comments)    tendonitis   . Neomycin Itching  . Quinolones Other (See Comments)    Achilles Pain  . Sulfa Antibiotics Rash    Review of Systems All review of systems negative except what is listed in the HPI     Objective:    Physical Exam Vitals reviewed.  Constitutional:      Appearance: Normal appearance. She is normal weight.  Cardiovascular:     Rate and Rhythm: Normal rate.  Pulmonary:     Breath sounds: Normal breath sounds.  Abdominal:     General: Bowel sounds are normal. There is no distension.     Palpations: Abdomen is soft.     Tenderness: There is no guarding.     Hernia: A hernia is present.     Comments: Right lower abdomen/inguinal hernia, easily reduces, soft, no pain  Skin:    General: Skin is warm and dry.     Findings: No erythema or rash.  Neurological:     General: No focal deficit present.     Mental Status: She is alert and oriented to person, place, and time. Mental status is at baseline.  Psychiatric:        Mood and Affect: Mood normal.        Behavior: Behavior normal.        Thought Content: Thought content normal.        Judgment: Judgment normal.     BP  130/87   Pulse 77   Temp (!) 97.5 F (36.4 C)   Wt 165 lb 3.2 oz (74.9 kg)   SpO2 99%   BMI 24.05 kg/m  Wt Readings from Last 3 Encounters:  03/06/21 165 lb 3.2 oz (74.9 kg)  02/22/21 165 lb 6.4 oz (75 kg)  07/28/20 187 lb 2.7 oz (84.9 kg)    Health Maintenance Due  Topic Date Due  . Hepatitis C Screening  Never done  . Meningococcal B Vaccine (1 of 4 - Increased Risk Bexsero 2-dose series) Never done  . DEXA SCAN  Never done  . PNA vac Low Risk Adult (2 of 2 - PPSV23) 09/14/2018  . COVID-19 Vaccine (3 - Pfizer risk 4-dose series) 03/10/2020    There are no preventive care reminders to display for this patient.   No results found for: TSH Lab Results  Component Value Date   WBC 5.5 06/27/2020   HGB 14.2 06/27/2020   HCT 40.7 06/27/2020   MCV 99.3 06/27/2020   PLT 355 06/27/2020   Lab Results  Component Value Date   NA 136 08/30/2019   K 3.6 08/30/2019   CO2 21 (L) 08/30/2019   GLUCOSE 88 08/30/2019   BUN 12 08/30/2019   CREATININE 0.45 08/30/2019   CALCIUM 9.4 08/30/2019   ANIONGAP 10 08/30/2019   No results found for: CHOL No results found for: HDL No results found for: LDLCALC No results found for: TRIG No results found for: CHOLHDL No results found for: HGBA1C     Assessment & Plan:   1. Hernia of abdominal wall  Right inguinal hernia, easily reduces, no red flags today. Reminded her to avoid lifting more than 10 pounds, bending, squatting, and the importance of applying light pressure when coughing, sneezing, etc. She can continue wearing abdominal binder when walking around if she feels like it is helping. Will go ahead and order surgery referral so we can get things started. She will be out of town through next week, but this will help prevent any delays for when she returns. Educated her on red flags to watch for and handout provided with education and precautions. She will be with family while she is gone and is aware of signs and symptoms requiring  urgent evaluation.  - Ambulatory referral to General Surgery  Follow-up if symptoms worsen or fail to improve.   Terrilyn Saver, NP

## 2021-03-06 NOTE — Patient Instructions (Signed)
Hernia, Adult     A hernia happens when tissue inside your body pushes out through a weak spot in your belly muscles (abdominal wall). This makes a round lump (bulge). The lump may be:  In a scar from surgery that was done in your belly (incisional hernia).  Near your belly button (umbilical hernia).  In your groin (inguinal hernia). Your groin is the area where your leg meets your lower belly (abdomen). This kind of hernia could also be: ? In your scrotum, if you are female. ? In folds of skin around your vagina, if you are female.  In your upper thigh (femoral hernia).  Inside your belly (hiatal hernia). This happens when your stomach slides above the muscle between your belly and your chest (diaphragm). If your hernia is small and it does not cause pain, you may not need treatment. If your hernia is large or it causes pain, you may need surgery. Follow these instructions at home: Activity  Avoid stretching or overusing (straining) the muscles near your hernia. Straining can happen when you: ? Lift something heavy. ? Poop (have a bowel movement).  Do not lift anything that is heavier than 10 lb (4.5 kg), or the limit that you are told, until your doctor says that it is safe.  Use the strength of your legs when you lift something heavy. Do not use only your back muscles to lift. General instructions  Do these things if told by your doctor so you do not have trouble pooping (constipation): ? Drink enough fluid to keep your pee (urine) pale yellow. ? Eat foods that are high in fiber. These include fresh fruits and vegetables, whole grains, and beans. ? Limit foods that are high in fat and processed sugars. These include foods that are fried or sweet. ? Take medicine for trouble pooping.  When you cough, try to cough gently.  You may try to push your hernia in by very gently pressing on it when you are lying down. Do not try to force the bulge back in if it will not push in  easily.  If you are overweight, work with your doctor to lose weight safely.  Do not use any products that have nicotine or tobacco in them. These include cigarettes and e-cigarettes. If you need help quitting, ask your doctor.  If you will be having surgery (hernia repair), watch your hernia for changes in shape, size, or color. Tell your doctor if you see any changes.  Take over-the-counter and prescription medicines only as told by your doctor.  Keep all follow-up visits as told by your doctor. Contact a doctor if:  You get new pain, swelling, or redness near your hernia.  You poop fewer times in a week than normal.  You have trouble pooping.  You have poop (stool) that is more dry than normal.  You have poop that is harder or larger than normal. Get help right away if:  You have a fever.  You have belly pain that gets worse.  You feel sick to your stomach (nauseous).  You throw up (vomit).  Your hernia cannot be pushed in by very gently pressing on it when you are lying down. Do not try to force the bulge back in if it will not push in easily.  Your hernia: ? Changes in shape or size. ? Changes color. ? Feels hard or it hurts when you touch it. These symptoms may represent a serious problem that is an emergency. Do not   wait to see if the symptoms will go away. Get medical help right away. Call your local emergency services (911 in the [place] Summary  A hernia happens when tissue inside your body pushes out through a weak spot in the belly muscles. This creates a bulge.  If your hernia is small and it does not hurt, you may not need treatment. If your hernia is large or it hurts, you may need surgery.  If you will be having surgery, watch your hernia for changes in shape, size, or color. Tell your doctor about any changes. This information is not intended to replace advice given to you by your health care provider. Make sure you discuss any questions you have with  your health care provider. Document Revised: 03/26/2019 Document Reviewed: 09/04/2017 Elsevier Patient Education  [place].

## 2021-03-16 DIAGNOSIS — T781XXA Other adverse food reactions, not elsewhere classified, initial encounter: Secondary | ICD-10-CM | POA: Diagnosis not present

## 2021-03-16 DIAGNOSIS — Z882 Allergy status to sulfonamides status: Secondary | ICD-10-CM | POA: Diagnosis not present

## 2021-03-16 DIAGNOSIS — Q8901 Asplenia (congenital): Secondary | ICD-10-CM | POA: Insufficient documentation

## 2021-03-16 DIAGNOSIS — Z881 Allergy status to other antibiotic agents status: Secondary | ICD-10-CM | POA: Diagnosis not present

## 2021-03-16 DIAGNOSIS — R21 Rash and other nonspecific skin eruption: Secondary | ICD-10-CM | POA: Diagnosis not present

## 2021-03-16 DIAGNOSIS — Z880 Allergy status to penicillin: Secondary | ICD-10-CM | POA: Diagnosis not present

## 2021-03-16 DIAGNOSIS — T50905D Adverse effect of unspecified drugs, medicaments and biological substances, subsequent encounter: Secondary | ICD-10-CM | POA: Diagnosis not present

## 2021-03-21 DIAGNOSIS — U071 COVID-19: Secondary | ICD-10-CM | POA: Diagnosis not present

## 2021-04-07 DIAGNOSIS — D472 Monoclonal gammopathy: Secondary | ICD-10-CM | POA: Diagnosis not present

## 2021-04-10 DIAGNOSIS — D472 Monoclonal gammopathy: Secondary | ICD-10-CM | POA: Diagnosis not present

## 2021-04-10 DIAGNOSIS — Z9081 Acquired absence of spleen: Secondary | ICD-10-CM | POA: Diagnosis not present

## 2021-04-10 DIAGNOSIS — D704 Cyclic neutropenia: Secondary | ICD-10-CM | POA: Diagnosis not present

## 2021-04-10 DIAGNOSIS — K766 Portal hypertension: Secondary | ICD-10-CM | POA: Diagnosis not present

## 2021-04-11 ENCOUNTER — Other Ambulatory Visit: Payer: Self-pay | Admitting: Surgery

## 2021-04-11 DIAGNOSIS — K4090 Unilateral inguinal hernia, without obstruction or gangrene, not specified as recurrent: Secondary | ICD-10-CM | POA: Diagnosis not present

## 2021-04-12 ENCOUNTER — Other Ambulatory Visit: Payer: Self-pay

## 2021-04-12 ENCOUNTER — Ambulatory Visit (INDEPENDENT_AMBULATORY_CARE_PROVIDER_SITE_OTHER): Payer: Medicare Other | Admitting: Sports Medicine

## 2021-04-12 DIAGNOSIS — M1712 Unilateral primary osteoarthritis, left knee: Secondary | ICD-10-CM | POA: Diagnosis not present

## 2021-04-12 DIAGNOSIS — M79672 Pain in left foot: Secondary | ICD-10-CM | POA: Diagnosis not present

## 2021-04-12 NOTE — Assessment & Plan Note (Signed)
Overall doing well after Orthovisc No. 4 last month, she did have a fall, some scrapes but no further intervention needed.

## 2021-04-12 NOTE — Progress Notes (Signed)
    Procedures performed today:    None.  Independent interpretation of notes and tests performed by another provider:   None.  Brief History, Exam, Impression, and Recommendations:    Primary osteoarthritis of left knee Overall doing well after Orthovisc No. 4 last month, she did have a fall, some scrapes but no further intervention needed.  Left foot pain We also injected her left fourth metatarsophalangeal joint. She is doing better with this respect, she was having some pain a little bit further back on the metatarsal shaft concerning for stress injury, I did place a metatarsal pad in her orthotic, return 4 weeks as needed for this.    ___________________________________________ Gwen Her. Dianah Field, M.D., ABFM., CAQSM. Primary Care and [place] Instructor of [place] [facility]

## 2021-04-12 NOTE — Assessment & Plan Note (Signed)
We also injected her left fourth metatarsophalangeal joint. She is doing better with this respect, she was having some pain a little bit further back on the metatarsal shaft concerning for stress injury, I did place a metatarsal pad in her orthotic, return 4 weeks as needed for this.

## 2021-04-24 DIAGNOSIS — U071 COVID-19: Secondary | ICD-10-CM | POA: Diagnosis not present

## 2021-04-28 ENCOUNTER — Encounter (HOSPITAL_COMMUNITY): Payer: Medicare Other

## 2021-05-04 NOTE — Patient Instructions (Signed)
DUE TO COVID-19 ONLY ONE VISITOR IS ALLOWED TO COME WITH YOU AND STAY IN THE WAITING ROOM ONLY DURING PRE OP AND PROCEDURE DAY OF SURGERY. THE 1 VISITOR  MAY VISIT WITH YOU AFTER SURGERY IN YOUR PRIVATE ROOM DURING VISITING HOURS ONLY!                 [name]    Your procedure is scheduled on: 05/10/21   Report to [facility] Main  Entrance   Report to admitting at  9:30 AM     Call this number if you have problems the morning of surgery [place], NO [place].   No food after midnight.    You may have clear liquid until 6:30 AM  .  At 6:00 AM drink pre surgery drink  . Nothing by mouth after 6:30 AM.   Take these medicines the morning of surgery with A SIP OF WATER: none.     Use your inhaler and bring it with you                                 You may not have any metal on your body including hair pins and              piercings  Do not wear jewelry, make-up, lotions, powders or perfumes, deodorant             Do not wear nail polish on your fingernails.  Do not shave  48 hours prior to surgery.                Do not brineck.g valuables to the hospital. [place].  Contacts, dentures or bridgework may not be worn into surgery. .     Patients discharged the day of surgery will not be allowed to drive home.   IF YOU ARE HAVING SURGERY AND GOING HOME THE SAME DAY, YOU MUST HAVE AN ADULT TO DRIVE YOU HOME AND BE WITH YOU FOR 24 HOURS.   YOU MAY GO HOME BY TAXI OR UBER OR ORTHERWISE, BUT AN ADULT MUST ACCOMPANY YOU HOME AND STAY WITH YOU FOR 24 HOURS.  Name and phone number of your driver:  Special Instructions: N/A              Please read over the following fact sheets you were given: _____________________________________________________________________             [facility] - Preparing for Surgery Before surgery,  you can play an important role.  Because skin is not sterile, your skin needs to be as free of germs as possible.  You can reduce the number of germs on your skin by washing with CHG (chlorahexidine gluconate) soap before surgery.  CHG is an antiseptic cleaner which kills germs and bonds with the skin to continue killing germs even after washing. Please DO NOT use if you have an allergy to CHG or antibacterial soaps.  If your skin becomes reddened/irritated stop using the CHG and inform your nurse when you arrive at Short Stay. Do not shave (including legs and underarms) for at least 48 hours prior to the first CHG shower.    Please follow these instructions carefully:  1.  Shower with CHG Soap the night before surgery and the  morning of Surgery.  2.  If you choose to wash your hair, wash your hair first as usual with your  normal  shampoo.  3.  After you shampoo, rinse your hair and body thoroughly to remove the  shampoo.                                        4.  Use CHG as you would any other liquid soap.  You can apply chg directly  to the skin and wash                       Gently with a scrungie or clean washcloth.  5.  Apply the CHG Soap to your body ONLY FROM THE NECK DOWN.   Do not use on face/ open                           Wound or open sores. Avoid contact with eyes, ears mouth and genitals (private parts).                       Wash face,  Genitals (private parts) with your normal soap.             6.  Wash thoroughly, paying special attention to the area where your surgery  will be performed.  7.  Thoroughly rinse your body with warm water from the neck down.  8.  DO NOT shower/wash with your normal soap after using and rinsing off  the CHG Soap.             9.  Pat yourself dry with a clean towel.            10.  Wear clean pajamas.            11.  Place clean sheets on your bed the night of your first shower and do not  sleep with pets. Day of Surgery : Do not apply any  lotions/deodorants the morning of surgery.  Please wear clean clothes to the hospital/surgery center.  FAILURE TO FOLLOW THESE INSTRUCTIONS MAY RESULT IN THE CANCELLATION OF YOUR SURGERY PATIENT SIGNATURE_________________________________  NURSE SIGNATURE__________________________________  ________________________________________________________________________

## 2021-05-05 ENCOUNTER — Encounter (HOSPITAL_COMMUNITY): Payer: Self-pay

## 2021-05-05 ENCOUNTER — Other Ambulatory Visit: Payer: Self-pay

## 2021-05-05 ENCOUNTER — Encounter (HOSPITAL_COMMUNITY)
Admission: RE | Admit: 2021-05-05 | Discharge: 2021-05-05 | Disposition: A | Discharge status: Home / Self Care | Payer: Medicare Other | Source: Ambulatory Visit | Attending: Surgery | Admitting: Surgery

## 2021-05-05 DIAGNOSIS — Z01812 Encounter for preprocedural laboratory examination: Secondary | ICD-10-CM | POA: Insufficient documentation

## 2021-05-05 LAB — BASIC METABOLIC PANEL
Anion gap: 6 (ref 5–15)
BUN: 11 mg/dL (ref 8–23)
CO2: 28 mmol/L (ref 22–32)
Calcium: 10 mg/dL (ref 8.9–10.3)
Chloride: 103 mmol/L (ref 98–111)
Creatinine, Ser: 0.42 mg/dL — ABNORMAL LOW (ref 0.44–1.00)
GFR, Estimated: >60 mL/min (ref >60)
Glucose, Bld: 98 mg/dL (ref 70–99)
Potassium: 4.7 mmol/L (ref 3.5–5.1)
Sodium: 137 mmol/L (ref 135–145)

## 2021-05-05 LAB — CBC
HCT: 40.3 % (ref 36.0–46.0)
Hemoglobin: 14.1 g/dL (ref 12.0–15.0)
MCH: 35.3 pg — ABNORMAL HIGH (ref 26.0–34.0)
MCHC: 35 g/dL (ref 30.0–36.0)
MCV: 100.8 fL — ABNORMAL HIGH (ref 80.0–100.0)
Platelets: 316 10*3/uL (ref 150–400)
RBC: 4 MIL/uL (ref 3.87–5.11)
RDW: 14.6 % (ref 11.5–15.5)
WBC: 3.7 10*3/uL — ABNORMAL LOW (ref 4.0–10.5)
nRBC: 0.5 % — ABNORMAL HIGH (ref 0.0–0.2)

## 2021-05-05 NOTE — Progress Notes (Signed)
COVID Vaccine Completed:Yes Date COVID Vaccine completed:02/11/20-booster 07/2020 COVID vaccine manufacturer: Pfizer      PCP - Dr. Seleta Rhymes Cardiologist - none  Chest x-ray - no EKG - 06/27/20-epic Stress Test - no ECHO - no Cardiac Cath - NA Pacemaker/ICD device last checked:NA  Sleep Study - no CPAP -   Fasting Blood Sugar -NA  Checks Blood Sugar _____ times a day  Blood Thinner Instructions:NA Aspirin Instructions: Last Dose:  Anesthesia review:   Patient denies shortness of breath, fever, cough and chest pain at PAT appointment Yes. Pt walks 3-4 miles a day and has lost 35 lbs since her husband died a few months ago.  The liver disease and portal hypertension  was years ago and no problem now.  Patient verbalized understanding of instructions that were given to them at the PAT appointment. Patient was also instructed that they will need to review over the PAT instructions again at home before surgery.yes

## 2021-05-08 ENCOUNTER — Other Ambulatory Visit: Payer: Self-pay

## 2021-05-08 ENCOUNTER — Other Ambulatory Visit (HOSPITAL_COMMUNITY): Payer: Medicare Other

## 2021-05-08 ENCOUNTER — Ambulatory Visit: Payer: Medicare Other

## 2021-05-08 ENCOUNTER — Other Ambulatory Visit: Payer: Self-pay | Admitting: Family Medicine

## 2021-05-08 ENCOUNTER — Ambulatory Visit (INDEPENDENT_AMBULATORY_CARE_PROVIDER_SITE_OTHER): Payer: Medicare Other | Admitting: Family Medicine

## 2021-05-08 VITALS — BP 105/70 | HR 69 | Ht 69.5 in | Wt 153.1 lb

## 2021-05-08 DIAGNOSIS — Z780 Asymptomatic menopausal state: Secondary | ICD-10-CM

## 2021-05-08 DIAGNOSIS — Z0000 Encounter for general adult medical examination without abnormal findings: Secondary | ICD-10-CM | POA: Diagnosis not present

## 2021-05-08 NOTE — Patient Instructions (Addendum)
[place] Maintenance Summary and Written Plan of Care  Ms. Beadle ,  Thank you for allowing me to perform your Medicare Annual Wellness Visit and for your ongoing commitment to your health.   Health Maintenance & Immunization History Health Maintenance  Topic Date Due  . COVID-19 Vaccine (4 - Booster for [place] series) 05/24/2021 (Originally 11/15/2020)  . DEXA SCAN  05/08/2022 (Originally 09/14/2018)  . TETANUS/TDAP  05/08/2022 (Originally 03/29/2021)  . PNA vac Low Risk Adult (2 of 2 - PPSV23) 05/08/2022 (Originally 09/14/2018)  . Meningococcal B Vaccine (1 of 4 - Increased Risk Bexsero 2-dose series) 05/08/2022 (Originally 09/15/1961)  . INFLUENZA VACCINE  07/17/2021  . MAMMOGRAM  06/06/2022  . COLONOSCOPY (Pts 45-68yrs Insurance coverage will need to be confirmed)  04/12/2023  . Hepatitis C Screening  Completed  . HPV VACCINES  Aged Out   Immunization History  Administered Date(s) Administered  . H1N1 11/19/2008  . HiB (PRP-T) 02/03/2009  . Influenza, High Dose Seasonal PF 09/16/2018, 09/16/2018, 08/25/2019, 08/25/2019  . Influenza-Unspecified 09/15/2014, 10/08/2015, 08/21/2016, 09/24/2017, 08/25/2019  . PFIZER(Purple Top)SARS-COV-2 Vaccination 01/14/2020, 02/11/2020, 08/15/2020  . Pneumococcal Conjugate-13 01/15/2014  . Pneumococcal Polysaccharide-23 08/29/2006, 09/21/2011  . Td 09/08/2008  . Tdap 03/30/2011  . Zoster Recombinat (Shingrix) 02/28/2018, 02/28/2018, 08/12/2018, 08/12/2018    These are the patient goals that we discussed: Goals Addressed              This Visit's Progress   . - Patient Stated (pt-stated)        05/08/2021 AWV Goal: Fall Prevention  . Over the next year, patient will decrease their risk for falls by: o Using assistive devices, such as a cane or walker, as needed o Identifying fall risks within their home and correcting them by: - Removing throw rugs - Adding handrails to stairs or ramps - Removing clutter  and keeping a clear pathway throughout the home - Increasing light, especially at night - Adding shower handles/bars - Raising toilet seat o Identifying potential personal risk factors for falls: - Medication side effects - Incontinence/urgency - Vestibular dysfunction - Hearing loss - Musculoskeletal disorders - Neurological disorders - Orthostatic hypotension          This is a list of Health Maintenance Items that are overdue or due now: Pneumococcal vaccine  Td vaccine Screening mammography Bone densitometry screening   Orders/Referrals Placed Today: Orders Placed This Encounter  Procedures  . DEXAScan    Standing Status:   Future    Standing Expiration Date:   05/08/2022    Scheduling Instructions:     Please call patient to schedule.    Order Specific Question:   Reason for exam:    Answer:   post menopausal    Order Specific Question:   Preferred imaging location?    Answer:   [facility]   (Contact our referral department at [phone] if you have not spoken with someone about your referral appointment within the next 5 days)    Follow-up Plan Follow-up with Luetta Nutting, DO as planned, to discuss labs, grief counseling and skin questions. . Schedule your pneumonia and tetanus vaccine at your pharmacy.  . Mammogram is due in June, 2022.  [facility] Bone density referral has been sent and they will call you to schedule. . Medicare wellness in one year.       Bone Density Test A bone density test uses a type of X-ray to measure the amount of calcium and other minerals in a  person's bones. It can measure bone density in the hip and the spine. The test is similar to having a regular X-ray. This test may also be called:  Bone densitometry.  Bone mineral density test.  Dual-energy X-ray absorptiometry (DEXA). You may have this test to:  Diagnose a condition that causes weak or thin bones (osteoporosis).  Screen you for osteoporosis.  Predict  your risk for a broken bone (fracture).  Determine how well your osteoporosis treatment is working. Tell a health care provider about:  Any allergies you have.  All medicines you are taking, including vitamins, herbs, eye drops, creams, and over-the-counter medicines.  Any problems you or family members have had with anesthetic medicines.  Any blood disorders you have.  Any surgeries you have had.  Any medical conditions you have.  Whether you are pregnant or may be pregnant.  Any medical tests you have had within the past 14 days that used contrast material. What are the risks? Generally, this is a safe test. However, it does expose you to a small amount of radiation, which can slightly increase your cancer risk. What happens before the test?  Do not take any calcium supplements within the 24 hours before your test.  You will need to remove all metal jewelry, eyeglasses, removable dental appliances, and any other metal objects on your body. What happens during the test?  You will lie down on an exam table. There will be an X-ray generator below you and an imaging device above you.  Other devices, such as boxes or braces, may be used to position your body properly for the scan.  The machine will slowly scan your body. You will need to keep very still while the machine does the scan.  The images will show up on a screen in the room. Images will be examined by a specialist after your test is finished. The procedure may vary among health care providers and hospitals.   What can I expect after the test? It is up to you to get the results of your test. Ask your health care provider, or the department that is doing the test, when your results will be ready. Summary  A bone density test is an imaging test that uses a type of X-ray to measure the amount of calcium and other minerals in your bones.  The test may be used to diagnose or screen you for a condition that causes weak or  thin bones (osteoporosis), predict your risk for a broken bone (fracture), or determine how well your osteoporosis treatment is working.  Do not take any calcium supplements within 24 hours before your test.  Ask your health care provider, or the department that is doing the test, when your results will be ready. This information is not intended to replace advice given to you by your health care provider. Make sure you discuss any questions you have with your health care provider. Document Revised: 05/19/2020 Document Reviewed: 05/19/2020 Elsevier Patient Education  [place] Maintenance, Female Adopting a healthy lifestyle and getting preventive care are important in promoting health and wellness. Ask your health care provider about:  The right schedule for you to have regular tests and exams.  Things you can do on your own to prevent diseases and keep yourself healthy. What should I know about diet, weight, and exercise? Eat a healthy diet  Eat a diet that includes plenty of vegetables, fruits, low-fat dairy products, and lean protein.  Do not  eat a lot of foods that are high in solid fats, added sugars, or sodium.   Maintain a healthy weight Body mass index (BMI) is used to identify weight problems. It estimates body fat based on height and weight. Your health care provider can help determine your BMI and help you achieve or maintain a healthy weight. Get regular exercise Get regular exercise. This is one of the most important things you can do for your health. Most adults should:  Exercise for at least 150 minutes each week. The exercise should increase your heart rate and make you sweat (moderate-intensity exercise).  Do strengthening exercises at least twice a week. This is in addition to the moderate-intensity exercise.  Spend less time sitting. Even light physical activity can be beneficial. Watch cholesterol and blood lipids Have your blood tested for  lipids and cholesterol at 68 years of age, then have this test every 5 years. Have your cholesterol levels checked more often if:  Your lipid or cholesterol levels are high.  You are older than 68 years of age.  You are at high risk for heart disease. What should I know about cancer screening? Depending on your health history and family history, you may need to have cancer screening at various ages. This may include screening for:  Breast cancer.  Cervical cancer.  Colorectal cancer.  Skin cancer.  Lung cancer. What should I know about heart disease, diabetes, and high blood pressure? Blood pressure and heart disease  High blood pressure causes heart disease and increases the risk of stroke. This is more likely to develop in people who have high blood pressure readings, are of African descent, or are overweight.  Have your blood pressure checked: ? Every 3-5 years if you are 57-21 years of age. ? Every year if you are 14 years old or older. Diabetes Have regular diabetes screenings. This checks your fasting blood sugar level. Have the screening done:  Once every three years after age 21 if you are at a normal weight and have a low risk for diabetes.  More often and at a younger age if you are overweight or have a high risk for diabetes. What should I know about preventing infection? Hepatitis B If you have a higher risk for hepatitis B, you should be screened for this virus. Talk with your health care provider to find out if you are at risk for hepatitis B infection. Hepatitis C Testing is recommended for:  Everyone born from [birth year] through 1963.  Anyone with known risk factors for hepatitis C. Sexually transmitted infections (STIs)  Get screened for STIs, including gonorrhea and chlamydia, if: ? You are sexually active and are younger than 68 years of age. ? You are older than 68 years of age and your health care provider tells you that you are at risk for this type of  infection. ? Your sexual activity has changed since you were last screened, and you are at increased risk for chlamydia or gonorrhea. Ask your health care provider if you are at risk.  Ask your health care provider about whether you are at high risk for HIV. Your health care provider may recommend a prescription medicine to help prevent HIV infection. If you choose to take medicine to prevent HIV, you should first get tested for HIV. You should then be tested every 3 months for as long as you are taking the medicine. Pregnancy  If you are about to stop having your period (premenopausal) and you may  become pregnant, seek counseling before you get pregnant.  Take 400 to 800 micrograms (mcg) of folic acid every day if you become pregnant.  Ask for birth control (contraception) if you want to prevent pregnancy. Osteoporosis and menopause Osteoporosis is a disease in which the bones lose minerals and strength with aging. This can result in bone fractures. If you are 25 years old or older, or if you are at risk for osteoporosis and fractures, ask your health care provider if you should:  Be screened for bone loss.  Take a calcium or vitamin D supplement to lower your risk of fractures.  Be given hormone replacement therapy (HRT) to treat symptoms of menopause. Follow these instructions at home: Lifestyle  Do not use any products that contain nicotine or tobacco, such as cigarettes, e-cigarettes, and chewing tobacco. If you need help quitting, ask your health care provider.  Do not use street drugs.  Do not share needles.  Ask your health care provider for help if you need support or information about quitting drugs. Alcohol use  Do not drink alcohol if: ? Your health care provider tells you not to drink. ? You are pregnant, may be pregnant, or are planning to become pregnant.  If you drink alcohol: ? Limit how much you use to 0-1 drink a day. ? Limit intake if you are  breastfeeding.  Be aware of how much alcohol is in your drink. In the [place] one drink equals one 12 oz bottle of beer (355 mL), one 5 oz glass of wine (148 mL), or one 1 oz glass of hard liquor (44 mL). General instructions  Schedule regular health, dental, and eye exams.  Stay current with your vaccines.  Tell your health care provider if: ? You often feel depressed. ? You have ever been abused or do not feel safe at home. Summary  Adopting a healthy lifestyle and getting preventive care are important in promoting health and wellness.  Follow your health care provider's instructions about healthy diet, exercising, and getting tested or screened for diseases.  Follow your health care provider's instructions on monitoring your cholesterol and blood pressure. This information is not intended to replace advice given to you by your health care provider. Make sure you discuss any questions you have with your health care provider. Document Revised: 11/26/2018 Document Reviewed: 11/26/2018 Elsevier Patient Education  2021 [place] A mammogram is a low energy X-ray of the breasts that is done to check for abnormal changes. This procedure can screen for and detect any changes that may indicate breast cancer. Mammograms are regularly done on women. A man may have a mammogram if he has a lump or swelling in his breast. A mammogram can also identify other changes and variations in the breast, such as:  Inflammation of the breast tissue (mastitis).  An infected area that contains a collection of pus (abscess).  A fluid-filled sac (cyst).  Fibrocystic changes. This is when breast tissue becomes denser, which can make the tissue feel rope-like or uneven under the skin.  Tumors that are not cancerous (benign). Tell a health care provider:  About any allergies you have.  If you have breast implants.  If you have had previous breast disease, biopsy, or surgery.  If you  are breastfeeding.  If you are younger than age 60.  If you have a family history of breast cancer.  Whether you are pregnant or may be pregnant. What are the risks? Generally, this is  a safe procedure. However, problems may occur, including:  Exposure to radiation. Radiation levels are very low with this test.  The results being misinterpreted.  The need for further tests.  The inability of the mammogram to detect certain cancers. What happens before the procedure?  Schedule your test about 1-2 weeks after your menstrual period if you are still menstruating. This is usually when your breasts are the least tender.  If you have had a mammogram done at a different facility in the past, get the mammogram X-rays or have them sent to your current exam facility. The new and old images will be compared.  Wash your breasts and underarms on the day of the test.  Do not wear deodorants, perfumes, lotions, or powders anywhere on your body on the day of the test.  Remove any jewelry from your neck.  Wear clothes that you can change into and out of easily. What happens during the procedure?  You will undress from the waist up and put on a gown that opens in the front.  You will stand in front of the X-ray machine.  Each breast will be placed between two plastic or glass plates. The plates will compress your breast for a few seconds. Try to stay as relaxed as possible during the procedure. This does not cause any harm to your breasts and any discomfort you feel will be very brief.  X-rays will be taken from different angles of each breast. The procedure may vary among health care providers and hospitals.   What happens after the procedure?  The mammogram will be examined by a specialist (radiologist).  You may need to repeat certain parts of the test, depending on the quality of the images. This is commonly done if the radiologist needs a better view of the breast tissue.  You may  resume your normal activities.  It is up to you to get the results of your procedure. Ask your health care provider, or the department that is doing the procedure, when your results will be ready. Summary  A mammogram is a low energy X-ray of the breasts that is done to check for abnormal changes. A man may have a mammogram if he has a lump or swelling in his breast.  If you have had a mammogram done at a different facility in the past, get the mammogram X-rays or have them sent to your current exam facility in order to compare them.  Schedule your test about 1-2 weeks after your menstrual period if you are still menstruating.  For this test, each breast will be placed between two plastic or glass plates. The plates will compress your breast for a few seconds.  Ask when your test results will be ready. Make sure you get your test results. This information is not intended to replace advice given to you by your health care provider. Make sure you discuss any questions you have with your health care provider. Document Revised: 07/24/2018 Document Reviewed: 07/24/2018 Elsevier Patient Education  [place].

## 2021-05-08 NOTE — Progress Notes (Signed)
MEDICARE ANNUAL WELLNESS VISIT  05/08/2021  Subjective:  Donna Ramsey is a 70 y.o. female patient of Luetta Nutting, DO who had a Medicare Annual Wellness Visit today. Dani is Retired and lives alone. she has 3 children. she reports that she is socially active and does interact with friends/family regularly. she is moderately physically active and enjoys reading, cooking, yard work and crafts.  Patient Care Team: Luetta Nutting, DO as PCP - General (Family Medicine)  Advanced Directives 05/08/2021 05/05/2021 07/28/2020 06/27/2020 08/30/2019 08/28/2019  Does Patient Have a Medical Advance Directive? Yes Yes No No No Yes  Type of Advance Directive Healthcare Power of [place]  Does patient want to make changes to medical advance directive? No - Patient declined - - - - -  Copy of [place] in Chart? Yes - validated most recent copy scanned in chart (See row information) - - - - -  Would patient like information on creating a medical advance directive? - - No - Patient declined Yes (ED - Information included in AVS) - [facility] Utilization Over the Past 12 Months: # of hospitalizations or ER visits: 0 # of surgeries: 1  Review of Systems    Patient reports that her overall health is better when compared to last year.  Review of Systems: History obtained from chart review and the patient  All other systems negative.  Pain Assessment Pain : No/denies pain     Current Medications & Allergies (verified) Allergies as of 05/08/2021      Reactions   Clindamycin Hives   Codeine Hives, Rash   Pentazocine Hives   Sulfamethoxazole-trimethoprim    Other reaction(s): Rash with itching (itching rash, pruritus) 2017 Reaction: rash, itching 03/16/2021: faint rash and itching 30 min after bactrim 400/80 oral challenge.    Doxycycline Other (See Comments)   Itching without rash near end of  course Itching without rash near end of course   Levofloxacin Rash, Other (See Comments)   tendonitis   Neomycin Itching   Quinolones Other (See Comments)   Achilles Pain   Sulfa Antibiotics Rash      Medication List       Accurate as of May 08, 2021  9:46 AM. If you have any questions, ask your nurse or doctor.        albuterol 108 (90 Base) MCG/ACT inhaler Commonly known as: VENTOLIN HFA Inhale 2 puffs into the lungs every 6 (six) hours as needed for wheezing or shortness of breath.   ALPRAZolam 0.25 MG tablet Commonly known as: XANAX Take 0.25 mg by mouth 2 (two) times daily as needed for anxiety.   Calcium 600+D Plus Minerals 600-400 MG-UNIT Tabs Take 2 tablets by mouth daily.   cetirizine 10 MG tablet Commonly known as: ZYRTEC Take 10 mg by mouth daily as needed for allergies.   cholecalciferol 25 MCG (1000 UNIT) tablet Commonly known as: VITAMIN D3 Take 1,000 Units by mouth daily.   Hyoscyamine Sulfate SL 0.125 MG Subl Take 0.125 mg by mouth daily as needed (GERD).   Omega 3 1000 MG Caps Take 2 capsules by mouth daily.   Pepcid Complete 10-800-165 MG chewable tablet Generic drug: famotidine-calcium carbonate-magnesium hydroxide Chew 1 tablet by mouth daily as needed (heartburn).   PRO-BIOTIC BLEND PO Take 1 capsule by mouth daily.   triamcinolone cream 0.1 % Commonly known as: KENALOG Apply 1 application topically daily as needed (eczema).  History (reviewed): Past Medical History:  Diagnosis Date  . Asplenia   . Family history of breast cancer   . GERD (gastroesophageal reflux disease)   . Liver disease, unspecified    heterogenous echotexture  . Portal hypertension ([place])   . Splenic infarct    Past Surgical History:  Procedure Laterality Date  . ABDOMINAL HYSTERECTOMY    . BREAST BIOPSY Bilateral 50 yrs ago   benign  . CHONDROPLASTY Left 07/28/2020   Procedure: CHONDROPLASTY LEFT KNEE;  Surgeon: Hiram Gash, MD;  Location: [place];  Service: Orthopedics;  Laterality: Left;  . HERNIA REPAIR    . KNEE ARTHROSCOPY WITH LATERAL MENISECTOMY Left 07/28/2020   Procedure: LEFT KNEE ARTHROSCOPY WITH LATERAL MENISECTOMY;  Surgeon: Hiram Gash, MD;  Location: [place];  Service: Orthopedics;  Laterality: Left;  . KNEE ARTHROSCOPY WITH MEDIAL MENISECTOMY Left 07/28/2020   Procedure: LEFT KNEE ARTHROSCOPY WITH MEDIAL MENISECTOMY;  Surgeon: Hiram Gash, MD;  Location: [place];  Service: Orthopedics;  Laterality: Left;  . SPLENECTOMY, TOTAL     Family History  Problem Relation Age of Onset  . Breast cancer Mother 74  . Stroke Mother   . Hypertension Father   . Heart attack Father   . Diabetes Sister   . Breast cancer Paternal Aunt   . Breast cancer Paternal Grandmother        dx 18s  . Rheum arthritis Daughter    Social History   Socioeconomic History  . Marital status: Widowed    Spouse name: Not on file  . Number of children: 3  . Years of education: 16  . Highest education level: Bachelor's degree (e.g., BA, AB, BS)  Occupational History    Comment: Retired Software engineer  Tobacco Use  . Smoking status: Never Smoker  . Smokeless tobacco: Never Used  Vaping Use  . Vaping Use: Never used  Substance and Sexual Activity  . Alcohol use: Yes    Comment: occ  . Drug use: Never  . Sexual activity: Never  Other Topics Concern  . Not on file  Social History Narrative   Lives along next to her daughter. Recently lost her husband in November. She is walking 3-5 miles per day. She likes to read, cook and do yard work.   Social Determinants of Health   Financial Resource Strain: Low Risk   . Difficulty of Paying Living Expenses: Not hard at all  Food Insecurity: No Food Insecurity  . Worried About Charity fundraiser in the Last Year: Never true  . Ran Out of Food in the Last Year: Never true  Transportation Needs: No Transportation Needs  . Lack of Transportation  (Medical): No  . Lack of Transportation (Non-Medical): No  Physical Activity: Sufficiently Active  . Days of Exercise per Week: 6 days  . Minutes of Exercise per Session: 70 min  Stress: No Stress Concern Present  . Feeling of Stress : Not at all  Social Connections: Moderately Isolated  . Frequency of Communication with Friends and Family: More than three times a week  . Frequency of Social Gatherings with Friends and Family: More than three times a week  . Attends Religious Services: More than 4 times per year  . Active Member of Clubs or Organizations: No  . Attends Archivist Meetings: Never  . Marital Status: Widowed    Activities of Daily Living In your present state of health, do you have any  difficulty performing the following activities: 05/08/2021 05/05/2021  Hearing? N N  Vision? N N  Difficulty concentrating or making decisions? N -  Walking or climbing stairs? N N  Dressing or bathing? N N  Doing errands, shopping? N N  Preparing Food and eating ? N -  Using the Toilet? N -  In the past six months, have you accidently leaked urine? Y -  Comment She has incontinence that she has had for a long time. -  Do you have problems with loss of bowel control? N -  Managing your Medications? N -  Managing your Finances? N -  Housekeeping or managing your Housekeeping? N -  Some recent data might be hidden    Patient Education/Literacy How often do you need to have someone help you when you read instructions, pamphlets, or other written materials from your doctor or pharmacy?: 1 - Never What is the last grade level you completed in school?: bachelor's degree  Exercise Current Exercise Habits: Home exercise routine, Type of exercise: walking, Time (Minutes): > 60, Frequency (Times/Week): 6, Weekly Exercise (Minutes/Week): 0, Intensity: Moderate, Exercise limited by: None identified  Diet Patient reports consuming 2 meals a day and 2 snack(s) a day Patient reports  that her primary diet is: Regular Patient reports that she does have regular access to food.   Depression Screen PHQ 2/9 Scores 05/08/2021 06/27/2020  PHQ - 2 Score 2 0  PHQ- 9 Score 2 3     Fall Risk Fall Risk  05/08/2021  Falls in the past year? 1  Number falls in past yr: 0  Injury with Fall? 0  Risk for fall due to : No Fall Risks  Follow up Falls evaluation completed     Objective:   BP 105/70 (BP Location: Right Arm, Patient Position: Sitting, Cuff Size: Normal)   Pulse 69   Ht 5' 9.5" (1.765 m)   Wt 153 lb 1.3 oz (69.4 kg)   SpO2 100%   BMI 22.28 kg/m   Last Weight  Most recent update: 05/08/2021  9:06 AM   Weight  69.4 kg (153 lb 1.3 oz)            Body mass index is 22.28 kg/m.  Hearing/Vision  . Coleta did not have difficulty with hearing/understanding during the face-to-face interview . Jaquay did not have difficulty with her vision during the face-to-face interview . Reports that she has had a formal eye exam by an eye care professional within the past year . Reports that she has not had a formal hearing evaluation within the past year  Cognitive Function: 6CIT Screen 05/08/2021  What Year? 0 points  What month? 0 points  What time? 0 points  Count back from 20 0 points  Months in reverse 0 points  Repeat phrase 0 points  Total Score 0    Normal Cognitive Function Screening: Yes (Normal:0-7, Significant for Dysfunction: >8)  Immunization & Health Maintenance Record Immunization History  Administered Date(s) Administered  . H1N1 11/19/2008  . HiB (PRP-T) 02/03/2009  . Influenza, High Dose Seasonal PF 09/16/2018, 09/16/2018, 08/25/2019, 08/25/2019  . Influenza-Unspecified 09/15/2014, 10/08/2015, 08/21/2016, 09/24/2017, 08/25/2019  . PFIZER(Purple Top)SARS-COV-2 Vaccination 01/14/2020, 02/11/2020, 08/15/2020  . Pneumococcal Conjugate-13 01/15/2014  . Pneumococcal Polysaccharide-23 08/29/2006, 09/21/2011  . Td 09/08/2008  . Tdap 03/30/2011  .  Zoster Recombinat (Shingrix) 02/28/2018, 02/28/2018, 08/12/2018, 08/12/2018    Health Maintenance  Topic Date Due  . COVID-19 Vaccine (4 - Booster for [place] series) 05/24/2021 (Originally 11/15/2020)  .  DEXA SCAN  05/08/2022 (Originally 09/14/2018)  . TETANUS/TDAP  05/08/2022 (Originally 03/29/2021)  . PNA vac Low Risk Adult (2 of 2 - PPSV23) 05/08/2022 (Originally 09/14/2018)  . Meningococcal B Vaccine (1 of 4 - Increased Risk Bexsero 2-dose series) 05/08/2022 (Originally 09/15/1963)  . INFLUENZA VACCINE  07/17/2021  . MAMMOGRAM  06/06/2022  . COLONOSCOPY (Pts 45-30yrs Insurance coverage will need to be confirmed)  04/12/2023  . Hepatitis C Screening  Completed  . HPV VACCINES  Aged Out       Assessment  This is a routine wellness examination for Shantelle Nebergall.  Health Maintenance: Due or Overdue There are no preventive care reminders to display for this patient.  [name] does not need a referral for Community Assistance: Care Management:   no Social Work:    no Prescription Assistance:  no Nutrition/Diabetes Education:  no   Plan:  Personalized Goals Goals Addressed              This Visit's Progress   .  Patient Stated (pt-stated)        05/08/2021 AWV Goal: Fall Prevention  . Over the next year, patient will decrease their risk for falls by: o Using assistive devices, such as a cane or walker, as needed o Identifying fall risks within their home and correcting them by: - Removing throw rugs - Adding handrails to stairs or ramps - Removing clutter and keeping a clear pathway throughout the home - Increasing light, especially at night - Adding shower handles/bars - Raising toilet seat o Identifying potential personal risk factors for falls: - Medication side effects - Incontinence/urgency - Vestibular dysfunction - Hearing loss - Musculoskeletal disorders - Neurological disorders - Orthostatic hypotension        Personalized Health Maintenance &  Screening Recommendations  Pneumococcal vaccine  Td vaccine Screening mammography Bone densitometry screening   Lung Cancer Screening Recommended: no (Low Dose CT Chest recommended if Age 49-80 years, 30 pack-year currently smoking OR have quit w/in past 15 years) Hepatitis C Screening recommended: no HIV Screening recommended: no  Advanced Directives: Written information was not given per the patient's request.  Referrals & Orders Orders Placed This Encounter  Procedures  . [place]    Follow-up Plan . Follow-up with Luetta Nutting, DO as planned, to discuss labs, grief counseling and skin questions. . Schedule your pneumonia and tetanus vaccine at your pharmacy.  . Mammogram is due in June, 2022.  [facility] Kitchen Bone density referral has been sent and they will call you to schedule. . Medicare wellness in one year.   I have personally reviewed and noted the following in the patient's chart:   . Medical and social history . Use of alcohol, tobacco or illicit drugs  . Current medications and supplements . Functional ability and status . Nutritional status . Physical activity . Advanced directives . List of other physicians . Hospitalizations, surgeries, and ER visits in previous 12 months . Vitals . Screenings to include cognitive, depression, and falls . Referrals and appointments  In addition, I have reviewed and discussed with patient certain preventive protocols, quality metrics, and best practice recommendations. A written personalized care plan for preventive services as well as general preventive health recommendations were provided to patient.     Tinnie Gens, RN  05/08/2021

## 2021-05-09 ENCOUNTER — Ambulatory Visit (INDEPENDENT_AMBULATORY_CARE_PROVIDER_SITE_OTHER): Payer: Medicare Other | Admitting: Family Medicine

## 2021-05-09 ENCOUNTER — Encounter: Payer: Self-pay | Admitting: Family Medicine

## 2021-05-09 VITALS — BP 136/88 | HR 63 | Ht 69.5 in | Wt 155.0 lb

## 2021-05-09 DIAGNOSIS — E7800 Pure hypercholesterolemia, unspecified: Secondary | ICD-10-CM

## 2021-05-09 DIAGNOSIS — M8580 Other specified disorders of bone density and structure, unspecified site: Secondary | ICD-10-CM

## 2021-05-09 DIAGNOSIS — N3281 Overactive bladder: Secondary | ICD-10-CM

## 2021-05-09 DIAGNOSIS — M899 Disorder of bone, unspecified: Secondary | ICD-10-CM

## 2021-05-09 DIAGNOSIS — M2550 Pain in unspecified joint: Secondary | ICD-10-CM | POA: Diagnosis not present

## 2021-05-09 DIAGNOSIS — R7309 Other abnormal glucose: Secondary | ICD-10-CM

## 2021-05-09 NOTE — H&P (Signed)
   Donna Ramsey Appointment: 04/11/2021 11:00 AM Location: [facility] Patient #: [id number] DOB: Apr 27, 1951 Married / Language: English / Race: White Female   History of Present Illness (Monzerat Handler A. Ninfa Linden MD; 04/11/2021 11:13 AM) The patient is a 70 year old female who presents with an inguinal hernia.  Chief complaint: Right inguinal hernia  She is well-known to me as I follow her for chronic breast issues in a family history breast cancer. She has recently lost weight and was doing heavy lifting and has since noticed a bulge in her right lower quadrant. She reports it will reduce. It is causes mild discomfort. She has had no obstructive symptoms. She is otherwise currently without complaints.  Past Surgical History Breast Biopsy  Bilateral. Hysterectomy (not due to cancer) - Partial  Splenectomy  Tonsillectomy   Diagnostic Studies History  Colonoscopy  1-5 years ago Mammogram  1-3 years ago Pap Smear  >5 years ago  Allergies Emeline Gins, CMA; 04/11/2021 10:52 AM) Codeine Sulfate *ANALGESICS - OPIOID*  Hydroxyquinolone *CHEMICALS*  Sulfa Antibiotics  Cephalexin *CEPHALOSPORINS*  Clindamycin HCl *ANTI-INFECTIVE AGENTS - MISC.*  Allergies Reconciled   Medication History Emeline Gins, CMA; 04/11/2021 10:52 AM) ALPRAZolam (0.25MG  Tablet, Oral) Active. Albuterol (90MCG/ACT Aerosol Soln, Inhalation) Active. Triamcinolone (oint)-Silicone (7.3% Therapy Pack, External) Active. Calcium Carbonate-Vitamin D (600-200MG -UNIT Capsule, Oral) Active. Probiotic (Oral) Active. Fish Oil (645MG  Capsule, Oral) Active. Vitamin D (1000UNIT Tablet, Oral) Active. Medications Reconciled  ROS: all other systems are normal  Vitals Emeline Gins CMA; 04/11/2021 10:51 AM) 04/11/2021 10:51 AM Weight: 156.4 lb Height: 69in Body Surface Area: 1.86 m Body Mass Index: 23.1 kg/m  Temp.: 97.73F  Pulse: 87 (Regular)  BP: 110/76(Sitting, Left  Arm, Standard)       Physical Exam (Dequavion Follette A. Ninfa Linden MD; 04/11/2021 11:14 AM) The physical exam findings are as follows: Note: She appears well exam  Her abdomen is soft and nontender. There is an easily reducible moderate size right inguinal hernia. There is no evidence of left inguinal herniorrhaphy the hernia. Lungs clear CV RRR Skin without rash Neuro grossly intact     Assessment & Plan (Raina Sole A. Ninfa Linden MD; 04/11/2021 11:15 AM) RIGHT INGUINAL HERNIA (K40.90) Impression: I have discussed the diagnosis of a right inguinal hernia with her in detail. We discussed abdominal wall anatomy and hernias in general. I discussed hernia repair with mesh. We discussed both the open and laparoscopic techniques. She wished to proceed with an open right inguinal hernia repair with mesh. We discussed the surgical procedure and risks. These include but are not limited to bleeding, infection, hernia recurrence, use of mesh, chronic pain, injury to surrounding structures, etc. We discussed postoperative recovery. Surgery will be scheduled

## 2021-05-09 NOTE — Assessment & Plan Note (Signed)
Weight down about 30lbs.  Update lipid panel.

## 2021-05-09 NOTE — Assessment & Plan Note (Signed)
Myrbetriq too costly.  She doesn't want to try anything else at this time.  She will let me know if symptoms worsen.

## 2021-05-09 NOTE — Assessment & Plan Note (Signed)
Checking A1c today.

## 2021-05-09 NOTE — Progress Notes (Signed)
Donna Ramsey - 70 y.o. female MRN [id number]  Date of birth: 02-05-1951  Subjective Chief Complaint  Patient presents with  . Labs Only  . Bleeding/Bruising    HPI Donna Ramsey is a 70 y.o. female here today for follow up visit.  She has has issues with OAB however she is concerned about side effects of anti-cholinergic medications.  Myrbetriq was too expensive.  She doesn't think that she wants to pursue additional treatment at this time.   She has been exercising more and following an intermittent fasting diet.  Her weight is down over 30 lbs over the past 6-7 months.  She feels good with this and plans to stick with this.  She would like to have updated labs to f/u on lipids and blood sugars.    ROS:  A comprehensive ROS was completed and negative except as noted per HPI  Allergies  Allergen Reactions  . Clindamycin Hives  . Codeine Hives and Rash  . Pentazocine Hives  . Sulfamethoxazole-Trimethoprim     Other reaction(s): Rash with itching (itching rash, pruritus) 2017 Reaction: rash, itching 03/16/2021: faint rash and itching 30 min after bactrim 400/80 oral challenge.   . Doxycycline Other (See Comments)    Itching without rash near end of course Itching without rash near end of course   . Levofloxacin Rash and Other (See Comments)    tendonitis   . Neomycin Itching  . Quinolones Other (See Comments)    Achilles Pain  . Sulfa Antibiotics Rash    Past Medical History:  Diagnosis Date  . Asplenia   . Family history of breast cancer   . GERD (gastroesophageal reflux disease)   . Liver disease, unspecified    heterogenous echotexture  . Portal hypertension ([place])   . Splenic infarct     Past Surgical History:  Procedure Laterality Date  . ABDOMINAL HYSTERECTOMY    . BREAST BIOPSY Bilateral 50 yrs ago   benign  . CHONDROPLASTY Left 07/28/2020   Procedure: CHONDROPLASTY LEFT KNEE;  Surgeon: Hiram Gash, MD;  Location: [facility];  Service:  Orthopedics;  Laterality: Left;  . HERNIA REPAIR    . KNEE ARTHROSCOPY WITH LATERAL MENISECTOMY Left 07/28/2020   Procedure: LEFT KNEE ARTHROSCOPY WITH LATERAL MENISECTOMY;  Surgeon: Hiram Gash, MD;  Location: [facility];  Service: Orthopedics;  Laterality: Left;  . KNEE ARTHROSCOPY WITH MEDIAL MENISECTOMY Left 07/28/2020   Procedure: LEFT KNEE ARTHROSCOPY WITH MEDIAL MENISECTOMY;  Surgeon: Hiram Gash, MD;  Location: [facility];  Service: Orthopedics;  Laterality: Left;  . SPLENECTOMY, TOTAL      Social History   Socioeconomic History  . Marital status: Widowed    Spouse name: Not on file  . Number of children: 3  . Years of education: 16  . Highest education level: Bachelor's degree (e.g., BA, AB, BS)  Occupational History    Comment: Retired Software engineer  Tobacco Use  . Smoking status: Never Smoker  . Smokeless tobacco: Never Used  Vaping Use  . Vaping Use: Never used  Substance and Sexual Activity  . Alcohol use: Yes    Comment: occ  . Drug use: Never  . Sexual activity: Never  Other Topics Concern  . Not on file  Social History Narrative   Lives along next to her daughter. Recently lost her husband in November. She is walking 3-5 miles per day. She likes to read, cook and do yard work.   Social Determinants of  Health   Financial Resource Strain: Low Risk   . Difficulty of Paying Living Expenses: Not hard at all  Food Insecurity: No Food Insecurity  . Worried About Charity fundraiser in the Last Year: Never true  . Ran Out of Food in the Last Year: Never true  Transportation Needs: No Transportation Needs  . Lack of Transportation (Medical): No  . Lack of Transportation (Non-Medical): No  Physical Activity: Sufficiently Active  . Days of Exercise per Week: 6 days  . Minutes of Exercise per Session: 70 min  Stress: No Stress Concern Present  . Feeling of Stress : Not at all  Social Connections: Moderately Isolated  . Frequency of  Communication with Friends and Family: More than three times a week  . Frequency of Social Gatherings with Friends and Family: More than three times a week  . Attends Religious Services: More than 4 times per year  . Active Member of Clubs or Organizations: No  . Attends Archivist Meetings: Never  . Marital Status: Widowed    Family History  Problem Relation Age of Onset  . Breast cancer Mother 63  . Stroke Mother   . Hypertension Father   . Heart attack Father   . Diabetes Sister   . Breast cancer Paternal Aunt   . Breast cancer Paternal Grandmother        dx 73s  . Rheum arthritis Daughter     Health Maintenance  Topic Date Due  . COVID-19 Vaccine (4 - Booster for [place] series) 05/24/2021 (Originally 11/15/2020)  . DEXA SCAN  05/08/2022 (Originally 09/14/2018)  . TETANUS/TDAP  05/08/2022 (Originally 03/29/2021)  . PNA vac Low Risk Adult (2 of 2 - PPSV23) 05/08/2022 (Originally 09/14/2018)  . Meningococcal B Vaccine (1 of 4 - Increased Risk Bexsero 2-dose series) 05/08/2022 (Originally 09/15/1963)  . INFLUENZA VACCINE  07/17/2021  . MAMMOGRAM  06/06/2022  . COLONOSCOPY (Pts 45-17yrs Insurance coverage will need to be confirmed)  04/12/2023  . Hepatitis C Screening  Completed  . HPV VACCINES  Aged Out     ----------------------------------------------------------------------------------------------------------------------------------------------------------------------------------------------------------------- Physical Exam BP 136/88 (BP Location: Left Arm, Patient Position: Sitting, Cuff Size: Small)   Pulse 63   Ht 5' 9.5" (1.765 m)   Wt 155 lb (70.3 kg)   BMI 22.56 kg/m   Physical Exam Constitutional:      Appearance: Normal appearance.  HENT:     Head: Normocephalic and atraumatic.  Skin:    General: Skin is warm and dry.  Neurological:     General: No focal deficit present.     Mental Status: She is alert.  Psychiatric:        Mood and Affect:  Mood normal.        Behavior: Behavior normal.     ------------------------------------------------------------------------------------------------------------------------------------------------------------------------------------------------------------------- Assessment and Plan  OAB (overactive bladder) Myrbetriq too costly.  She doesn't want to try anything else at this time.  She will let me know if symptoms worsen.   Elevated random blood glucose level Checking A1c today  Elevated LDL cholesterol level Weight down about 30lbs.  Update lipid panel.    No orders of the defined types were placed in this encounter.   No follow-ups on file.    This visit occurred during the SARS-CoV-2 public health emergency.  Safety protocols were in place, including screening questions prior to the visit, additional usage of staff PPE, and extensive cleaning of exam room while observing appropriate contact time as indicated for disinfecting solutions.

## 2021-05-10 ENCOUNTER — Ambulatory Visit (HOSPITAL_COMMUNITY): Payer: Medicare Other | Admitting: Anesthesiology

## 2021-05-10 ENCOUNTER — Ambulatory Visit (HOSPITAL_COMMUNITY)
Admission: RE | Admit: 2021-05-10 | Discharge: 2021-05-10 | Disposition: A | Discharge status: Home / Self Care | Payer: Medicare Other | Attending: Surgery | Admitting: Surgery

## 2021-05-10 ENCOUNTER — Other Ambulatory Visit: Payer: Self-pay

## 2021-05-10 ENCOUNTER — Encounter (HOSPITAL_COMMUNITY)
Admission: RE | Disposition: A | Discharge status: Home / Self Care | Payer: Self-pay | Source: Home / Self Care | Attending: Surgery

## 2021-05-10 ENCOUNTER — Encounter (HOSPITAL_COMMUNITY): Payer: Self-pay | Admitting: Surgery

## 2021-05-10 DIAGNOSIS — Z803 Family history of malignant neoplasm of breast: Secondary | ICD-10-CM | POA: Insufficient documentation

## 2021-05-10 DIAGNOSIS — K4090 Unilateral inguinal hernia, without obstruction or gangrene, not specified as recurrent: Secondary | ICD-10-CM | POA: Diagnosis not present

## 2021-05-10 DIAGNOSIS — Z882 Allergy status to sulfonamides status: Secondary | ICD-10-CM | POA: Insufficient documentation

## 2021-05-10 DIAGNOSIS — N3281 Overactive bladder: Secondary | ICD-10-CM | POA: Diagnosis not present

## 2021-05-10 DIAGNOSIS — Z79899 Other long term (current) drug therapy: Secondary | ICD-10-CM | POA: Insufficient documentation

## 2021-05-10 DIAGNOSIS — K219 Gastro-esophageal reflux disease without esophagitis: Secondary | ICD-10-CM | POA: Diagnosis not present

## 2021-05-10 DIAGNOSIS — Z881 Allergy status to other antibiotic agents status: Secondary | ICD-10-CM | POA: Diagnosis not present

## 2021-05-10 DIAGNOSIS — G8918 Other acute postprocedural pain: Secondary | ICD-10-CM | POA: Diagnosis not present

## 2021-05-10 DIAGNOSIS — Z885 Allergy status to narcotic agent status: Secondary | ICD-10-CM | POA: Diagnosis not present

## 2021-05-10 HISTORY — PX: INGUINAL HERNIA REPAIR: SHX194

## 2021-05-10 LAB — HEMOGLOBIN A1C
Hgb A1c MFr Bld: 4.5 % of total Hgb (ref <5.7)
Mean Plasma Glucose: 82 mg/dL
eAG (mmol/L): 4.6 mmol/L

## 2021-05-10 LAB — LIPID PANEL W/REFLEX DIRECT LDL
Cholesterol: 269 mg/dL — ABNORMAL HIGH (ref <200)
HDL: 132 mg/dL (ref >=50)
LDL Cholesterol (Calc): 125 mg/dL (calc) — ABNORMAL HIGH
Non-HDL Cholesterol (Calc): 137 mg/dL (calc) — ABNORMAL HIGH (ref <130)
Total CHOL/HDL Ratio: 2 (calc) (ref <5.0)
Triglycerides: 39 mg/dL (ref <150)

## 2021-05-10 LAB — URIC ACID: Uric Acid, Serum: 3.6 mg/dL (ref 2.5–7.0)

## 2021-05-10 LAB — VITAMIN D 25 HYDROXY (VIT D DEFICIENCY, FRACTURES): Vit D, 25-Hydroxy: 41 ng/mL (ref 30–100)

## 2021-05-10 SURGERY — REPAIR, HERNIA, INGUINAL, ADULT
Anesthesia: General | Laterality: Right

## 2021-05-10 MED ORDER — BUPIVACAINE HCL (PF) 0.5 % IJ SOLN
INTRAMUSCULAR | Status: DC | PRN
  Administered 2021-05-10: 15 mL

## 2021-05-10 MED ORDER — BUPIVACAINE HCL (PF) 0.5 % IJ SOLN
INTRAMUSCULAR | Status: DC | PRN
  Administered 2021-05-10: 10 mL

## 2021-05-10 MED ORDER — ONDANSETRON HCL 4 MG/2ML IJ SOLN
INTRAMUSCULAR | Status: DC | PRN
  Administered 2021-05-10: 4 mg via INTRAVENOUS

## 2021-05-10 MED ORDER — VANCOMYCIN HCL IN DEXTROSE 1-5 GM/200ML-% IV SOLN
1000.0000 mg | INTRAVENOUS | Status: AC
  Administered 2021-05-10: 1000 mg via INTRAVENOUS
  Filled 2021-05-10: qty 200

## 2021-05-10 MED ORDER — DEXAMETHASONE SODIUM PHOSPHATE 10 MG/ML IJ SOLN
INTRAMUSCULAR | Status: AC
  Filled 2021-05-10: qty 1

## 2021-05-10 MED ORDER — TRAMADOL HCL 50 MG PO TABS
ORAL_TABLET | ORAL | Status: AC
  Filled 2021-05-10: qty 1

## 2021-05-10 MED ORDER — DEXAMETHASONE SODIUM PHOSPHATE 10 MG/ML IJ SOLN
INTRAMUSCULAR | Status: DC | PRN
  Administered 2021-05-10: 4 mg via INTRAVENOUS

## 2021-05-10 MED ORDER — CHLORHEXIDINE GLUCONATE 0.12 % MT SOLN
15.0000 mL | Freq: Once | OROMUCOSAL | Status: AC
Start: 2021-05-10 — End: 2021-05-10
  Administered 2021-05-10: 15 mL via OROMUCOSAL

## 2021-05-10 MED ORDER — SCOPOLAMINE 1 MG/3DAYS TD PT72
1.0000 | MEDICATED_PATCH | TRANSDERMAL | Status: DC
  Administered 2021-05-10: 1.5 mg via TRANSDERMAL
  Filled 2021-05-10: qty 1

## 2021-05-10 MED ORDER — MIDAZOLAM HCL 2 MG/2ML IJ SOLN
1.0000 mg | INTRAMUSCULAR | Status: DC
  Administered 2021-05-10: 2 mg via INTRAVENOUS
  Filled 2021-05-10: qty 2

## 2021-05-10 MED ORDER — ORAL CARE MOUTH RINSE: 15.0000 mL | Freq: Once | OROMUCOSAL | Status: AC

## 2021-05-10 MED ORDER — TRAMADOL HCL 50 MG PO TABS: 50.0000 mg | ORAL_TABLET | Freq: Four times a day (QID) | ORAL | 0 refills | Status: DC | PRN

## 2021-05-10 MED ORDER — ENSURE PRE-SURGERY PO LIQD
296.0000 mL | Freq: Once | ORAL | Status: DC
  Filled 2021-05-10: qty 296

## 2021-05-10 MED ORDER — TRAMADOL HCL 50 MG PO TABS
50.0000 mg | ORAL_TABLET | Freq: Once | ORAL | Status: AC
  Administered 2021-05-10: 50 mg via ORAL

## 2021-05-10 MED ORDER — PROPOFOL 10 MG/ML IV BOLUS
INTRAVENOUS | Status: AC
  Filled 2021-05-10: qty 20

## 2021-05-10 MED ORDER — FENTANYL CITRATE (PF) 100 MCG/2ML IJ SOLN
50.0000 ug | INTRAMUSCULAR | Status: DC
  Administered 2021-05-10: 100 ug via INTRAVENOUS
  Filled 2021-05-10: qty 2

## 2021-05-10 MED ORDER — LIDOCAINE 2% (20 MG/ML) 5 ML SYRINGE
INTRAMUSCULAR | Status: AC
  Filled 2021-05-10: qty 5

## 2021-05-10 MED ORDER — ONDANSETRON HCL 4 MG/2ML IJ SOLN
INTRAMUSCULAR | Status: AC
  Filled 2021-05-10: qty 2

## 2021-05-10 MED ORDER — PROPOFOL 10 MG/ML IV BOLUS
INTRAVENOUS | Status: DC | PRN
  Administered 2021-05-10: 20 mg via INTRAVENOUS
  Administered 2021-05-10: 150 mg via INTRAVENOUS

## 2021-05-10 MED ORDER — LACTATED RINGERS IV SOLN: INTRAVENOUS | Status: DC

## 2021-05-10 MED ORDER — CHLORHEXIDINE GLUCONATE CLOTH 2 % EX PADS: 6.0000 | MEDICATED_PAD | Freq: Once | CUTANEOUS | Status: DC

## 2021-05-10 MED ORDER — BUPIVACAINE HCL (PF) 0.5 % IJ SOLN
INTRAMUSCULAR | Status: AC
  Filled 2021-05-10: qty 30

## 2021-05-10 MED ORDER — EPHEDRINE SULFATE-NACL 50-0.9 MG/10ML-% IV SOSY
PREFILLED_SYRINGE | INTRAVENOUS | Status: DC | PRN
  Administered 2021-05-10 (×2): 10 mg via INTRAVENOUS

## 2021-05-10 MED ORDER — BUPIVACAINE LIPOSOME 1.3 % IJ SUSP
INTRAMUSCULAR | Status: DC | PRN
  Administered 2021-05-10: 10 mL

## 2021-05-10 MED ORDER — ACETAMINOPHEN 500 MG PO TABS
1000.0000 mg | ORAL_TABLET | Freq: Once | ORAL | Status: AC
  Administered 2021-05-10: 1000 mg via ORAL
  Filled 2021-05-10: qty 2

## 2021-05-10 MED ORDER — LIDOCAINE 2% (20 MG/ML) 5 ML SYRINGE
INTRAMUSCULAR | Status: DC | PRN
  Administered 2021-05-10 (×2): 20 mg via INTRAVENOUS

## 2021-05-10 MED ORDER — FENTANYL CITRATE (PF) 100 MCG/2ML IJ SOLN: 25.0000 ug | INTRAMUSCULAR | Status: DC | PRN

## 2021-05-10 SURGICAL SUPPLY — 33 items
BLADE SURG 15 STRL LF DISP TIS (BLADE) ×1 IMPLANT
BLADE SURG 15 STRL SS (BLADE) ×1
CHLORAPREP W/TINT 26 (MISCELLANEOUS) ×2 IMPLANT
COVER WAND RF STERILE (DRAPES) IMPLANT
DECANTER SPIKE VIAL GLASS SM (MISCELLANEOUS) ×2 IMPLANT
DERMABOND ADVANCED (GAUZE/BANDAGES/DRESSINGS) ×1
DERMABOND ADVANCED .7 DNX12 (GAUZE/BANDAGES/DRESSINGS) ×1 IMPLANT
DRAIN PENROSE 0.5X18 (DRAIN) ×2 IMPLANT
DRAPE LAPAROTOMY TRNSV 102X78 (DRAPES) ×2 IMPLANT
ELECT REM PT RETURN 15FT ADLT (MISCELLANEOUS) ×2 IMPLANT
GAUZE SPONGE 4X4 12PLY STRL (GAUZE/BANDAGES/DRESSINGS) IMPLANT
GLOVE SURG ENC MOIS LTX SZ7.5 (GLOVE) ×2 IMPLANT
GOWN STRL REUS W/TWL XL LVL3 (GOWN DISPOSABLE) ×4 IMPLANT
KIT BASIN OR (CUSTOM PROCEDURE TRAY) ×2 IMPLANT
KIT TURNOVER KIT A (KITS) ×2 IMPLANT
MESH PARIETEX PROGRIP RIGHT (Mesh General) ×2 IMPLANT
NEEDLE HYPO 25X1 1.5 SAFETY (NEEDLE) ×2 IMPLANT
PACK BASIC VI WITH GOWN DISP (CUSTOM PROCEDURE TRAY) ×2 IMPLANT
PENCIL SMOKE EVACUATOR (MISCELLANEOUS) ×2 IMPLANT
SPONGE LAP 4X18 RFD (DISPOSABLE) ×2 IMPLANT
SUT MNCRL AB 4-0 PS2 18 (SUTURE) ×2 IMPLANT
SUT SILK 2 0 SH (SUTURE) ×2 IMPLANT
SUT VIC AB 2-0 CT1 27 (SUTURE) ×1
SUT VIC AB 2-0 CT1 TAPERPNT 27 (SUTURE) ×1 IMPLANT
SUT VIC AB 2-0 SH 27 (SUTURE) ×1
SUT VIC AB 2-0 SH 27X BRD (SUTURE) ×1 IMPLANT
SUT VIC AB 3-0 54XBRD REEL (SUTURE) ×1 IMPLANT
SUT VIC AB 3-0 BRD 54 (SUTURE) ×1
SUT VIC AB 3-0 SH 27 (SUTURE)
SUT VIC AB 3-0 SH 27XBRD (SUTURE) IMPLANT
SYR CONTROL 10ML LL (SYRINGE) ×2 IMPLANT
TOWEL OR 17X26 10 PK STRL BLUE (TOWEL DISPOSABLE) ×2 IMPLANT
TOWEL OR NON WOVEN STRL DISP B (DISPOSABLE) ×2 IMPLANT

## 2021-05-10 NOTE — Interval H&P Note (Signed)
History and Physical Interval Note:no change in H and P  05/10/2021 11:03 AM  Donna Ramsey  has presented today for surgery, with the diagnosis of RIGHT INGUINAL HERNIA.  The various methods of treatment have been discussed with the patient and family. After consideration of risks, benefits and other options for treatment, the patient has consented to  Procedure(s): RIGHT INGUINAL HERNIA REPAIR WITH MESH (Right) as a surgical intervention.  The patient's history has been reviewed, patient examined, no change in status, stable for surgery.  I have reviewed the patient's chart and labs.  Questions were answered to the patient's satisfaction.     Coralie Keens

## 2021-05-10 NOTE — Progress Notes (Signed)
Assisted Dr. Duane Boston with Right Side Tap  block. Side rails up, monitors on throughout procedure. See vital signs in flow sheet. Tolerated Procedure well.

## 2021-05-10 NOTE — Discharge Instructions (Signed)
[facility] _______[facility]  UMBILICAL OR INGUINAL HERNIA REPAIR: POST OP INSTRUCTIONS  Always review your discharge instruction sheet given to you by the facility where your surgery was performed. IF YOU HAVE DISABILITY OR FAMILY LEAVE FORMS, YOU MUST BRING THEM TO THE OFFICE FOR PROCESSING.   DO NOT GIVE THEM TO YOUR DOCTOR.  1. A  prescription for pain medication may be given to you upon discharge.  Take your pain medication as prescribed, if needed.  If narcotic pain medicine is not needed, then you may take acetaminophen (Tylenol) or ibuprofen (Advil) as needed. 2. Take your usually prescribed medications unless otherwise directed. If you need a refill on your pain medication, please contact your pharmacy.  They will contact our office to request authorization. Prescriptions will not be filled after 5 pm or on week-ends. 3. You should follow a light diet the first 24 hours after arrival home, such as soup and crackers, etc.  Be sure to include lots of fluids daily.  Resume your normal diet the day after surgery. 4.Most patients will experience some swelling and bruising around the umbilicus or in the groin and scrotum.  Ice packs and reclining will help.  Swelling and bruising can take several days to resolve.  6. It is common to experience some constipation if taking pain medication after surgery.  Increasing fluid intake and taking a stool softener (such as Colace) will usually help or prevent this problem from occurring.  A mild laxative (Milk of Magnesia or Miralax) should be taken according to package directions if there are no bowel movements after 48 hours. 7. Unless discharge instructions indicate otherwise, you may remove your bandages 24-48 hours after surgery, and you may shower at that time.  You may have steri-strips (small skin tapes) in place directly over the incision.  These strips should be left on the skin for 7-10 days.  If your surgeon used skin glue on the  incision, you may shower in 24 hours.  The glue will flake off over the next 2-3 weeks.  Any sutures or staples will be removed at the office during your follow-up visit. 8. ACTIVITIES:  You may resume regular (light) daily activities beginning the next day--such as daily self-care, walking, climbing stairs--gradually increasing activities as tolerated.  You may have sexual intercourse when it is comfortable.  Refrain from any heavy lifting or straining until approved by your doctor.  a.You may drive when you are no longer taking prescription pain medication, you can comfortably wear a seatbelt, and you can safely maneuver your car and apply brakes. b.RETURN TO WORK:   _____________________________________________  9.You should see your doctor in the office for a follow-up appointment approximately 2-3 weeks after your surgery.  Make sure that you call for this appointment within a day or two after you arrive home to insure a convenient appointment time. 10.OTHER INSTRUCTIONS: _OK TO SHOWER STARTING TOMORROW ICE PACK, TYLENOL, AND IBUPROFEN ALSO FOR PAIN NO LIFTING MORE THAN 15 TO 20 POUNDS FOR 4 WEEKS________________________    _____________________________________  WHEN TO CALL YOUR DOCTOR: 1. Fever over 101.0 2. Inability to urinate 3. Nausea and/or vomiting 4. Extreme swelling or bruising 5. Continued bleeding from incision. 6. Increased pain, redness, or drainage from the incision  The clinic staff is available to answer your questions during regular business hours.  Please don't hesitate to call and ask to speak to one of the nurses for clinical concerns.  If you have a medical emergency, go to  the nearest emergency room or call 911.  A surgeon from [facility] is always on call at the hospital   [address] [place], [place], [place]  [address] ?  [address], [place], [place]   [address] [phone] ? [phone] ? FAX [phone] Web site:  [email or web address]

## 2021-05-10 NOTE — Op Note (Signed)
RIGHT INGUINAL HERNIA REPAIR WITH MESH  Procedure Note  [name] 05/10/2021   Pre-op Diagnosis: RIGHT INGUINAL HERNIA     Post-op Diagnosis: same  Procedure(s): RIGHT INGUINAL HERNIA REPAIR WITH MESH  Surgeon(s): Coralie Keens, MD  Anesthesia: General  Staff:  Circulator: Edrick Kins, RN; Lurena Joiner, RN Scrub Person: Clement Husbands, CST  Estimated Blood Loss: Minimal               Procedure: The patient was brought to the operating identifies correct patient.  She had already received a right-sided tap block by anesthesiology in the preoperative holding area.  She is placed upon the operating table and general anesthesia was induced.  Her right lower quadrant was then prepped and draped in usual sterile fashion.  I anesthetized skin with Marcaine and made a longitudinal incision with a scalpel.  I then dissected down through Scarpa's fascia with electrocautery.  The external bleak fascia was then opened with internal and external ring.  The patient had an easily identifiable hernia sac.  I was able to elevate it.  All contents of been reduced.  I tied off the base of the sac with a 2-0 silk suture and excised the redundant sac.  I did have to suture ligate a bridging vein with 2-0 silk suture.  I then closed the soft tissue over the internal ring with interrupted silk sutures as well.  I next brought a piece of large Prolene ProGrip mesh onto the field.  It was placed as an onlay on the inguinal floor.  I then sutured the mesh to the pubic tubercle and transversalis fascia with interrupted Vicryl sutures.  Wide coverage the inguinal floor and internal ring appeared to be achieved.  I then closed the external beak fascia over the top of the mesh with a running 2-0 Vicryl suture.  Scarpa's fascia then closed interrupted 3-0 Vicryl sutures and the skin was closed with running 4-0 Monocryl.  Dermabond was then applied.  The patient tolerated the procedure well.  All the  counts were correct at the end of the procedure.  The patient was then extubated in the operating room and taken in stable just recovery room.          Coralie Keens   Date: 05/10/2021  Time: 12:05 PM

## 2021-05-10 NOTE — Anesthesia Preprocedure Evaluation (Addendum)
Anesthesia Evaluation  Patient identified by MRN, date of birth, ID band Patient awake    Reviewed: Allergy & Precautions, NPO status , Patient's Chart, lab work & pertinent test results  History of Anesthesia Complications Negative for: history of anesthetic complications  Airway Mallampati: II  TM Distance: >3 FB Neck ROM: Full    Dental  (+) Chipped, Dental Advisory Given   Pulmonary neg pulmonary ROS,    Pulmonary exam normal        Cardiovascular negative cardio ROS Normal cardiovascular exam     Neuro/Psych negative neurological ROS  negative psych ROS   GI/Hepatic Neg liver ROS, GERD  ,  Endo/Other  negative endocrine ROS  Renal/GU negative Renal ROS  negative genitourinary   Musculoskeletal  (+) Arthritis , Osteoarthritis,    Abdominal   Peds negative pediatric ROS (+)  Hematology negative hematology ROS (+)   Anesthesia Other Findings Monoclonal Gammopathy Mild Portal Hypertension  Reproductive/Obstetrics negative OB ROS                            Anesthesia Physical  Anesthesia Plan  ASA: II  Anesthesia Plan: General   Post-op Pain Management:  Regional for Post-op pain   Induction: Intravenous  PONV Risk Score and Plan: 4 or greater and Ondansetron, Dexamethasone, Midazolam, Treatment may vary due to age or medical condition and Scopolamine patch - Pre-op  Airway Management Planned: LMA  Additional Equipment:   Intra-op Plan:   Post-operative Plan: Extubation in OR  Informed Consent: I have reviewed the patients History and Physical, chart, labs and discussed the procedure including the risks, benefits and alternatives for the proposed anesthesia with the patient or authorized representative who has indicated his/her understanding and acceptance.     Dental advisory given  Plan Discussed with: Anesthesiologist and CRNA  Anesthesia Plan Comments:         Anesthesia Quick Evaluation

## 2021-05-10 NOTE — Anesthesia Postprocedure Evaluation (Signed)
Anesthesia Post Note  Patient: Donna Ramsey  Procedure(s) Performed: RIGHT INGUINAL HERNIA REPAIR WITH MESH (Right )     Patient location during evaluation: PACU Anesthesia Type: General Level of consciousness: sedated Pain management: pain level controlled Vital Signs Assessment: post-procedure vital signs reviewed and stable Respiratory status: spontaneous breathing and respiratory function stable Cardiovascular status: stable Postop Assessment: no apparent nausea or vomiting Anesthetic complications: no   No complications documented.  Last Vitals:  Vitals:   05/10/21 1245 05/10/21 1254  BP: 128/84 117/73  Pulse: 69 66  Resp: 11 14  Temp: 36.5 C   SpO2: 100% 100%    Last Pain:  Vitals:   05/10/21 1254  TempSrc:   PainSc: 5                  Rakan Soffer DANIEL

## 2021-05-10 NOTE — Transfer of Care (Signed)
Immediate Anesthesia Transfer of Care Note  Patient: Aeon Billig  Procedure(s) Performed: RIGHT INGUINAL HERNIA REPAIR WITH MESH (Right )  Patient Location: PACU  Anesthesia Type:General  Level of Consciousness: awake  Airway & Oxygen Therapy: Patient Spontanous Breathing  Post-op Assessment: Report given to RN and Post -op Vital signs reviewed and stable  Post vital signs: Reviewed and stable  Last Vitals:  Vitals Value Taken Time  BP 117/71 05/10/21 1212  Temp    Pulse 64 05/10/21 1213  Resp 18 05/10/21 1213  SpO2 99 % 05/10/21 1213  Vitals shown include unvalidated device data.  Last Pain:  Vitals:   05/10/21 0944  TempSrc:   PainSc: 0-No pain         Complications: No complications documented.

## 2021-05-10 NOTE — Anesthesia Procedure Notes (Signed)
Procedure Name: LMA Insertion Performed by: Milford Cage, CRNA Pre-anesthesia Checklist: Patient identified, Emergency Drugs available, Suction available and Patient being monitored Patient Re-evaluated:Patient Re-evaluated prior to induction Oxygen Delivery Method: Circle System Utilized Preoxygenation: Pre-oxygenation with 100% oxygen Induction Type: IV induction Ventilation: Mask ventilation without difficulty LMA: LMA inserted LMA Size: 4.0 Number of attempts: 2 Airway Equipment and Method: Bite block Placement Confirmation: positive ETCO2 Tube secured with: Tape Dental Injury: Teeth and Oropharynx as per pre-operative assessment

## 2021-05-10 NOTE — Anesthesia Procedure Notes (Signed)
Anesthesia Regional Block: TAP block   Pre-Anesthetic Checklist: ,, timeout performed, Correct Patient, Correct Site, Correct Laterality, Correct Procedure, Correct Position, site marked, Risks and benefits discussed,  Surgical consent,  Pre-op evaluation,  At surgeon's request and post-op pain management  Laterality: Right  Prep: chloraprep       Needles:  Injection technique: Single-shot  Needle Type: Echogenic Stimulator Needle     Needle Length: 10cm  Needle Gauge: 21     Additional Needles:   Narrative:  Start time: 05/10/2021 10:57 AM End time: 05/10/2021 11:06 AM Injection made incrementally with aspirations every 5 mL.  Performed by: Personally

## 2021-05-11 ENCOUNTER — Encounter (HOSPITAL_COMMUNITY): Payer: Self-pay | Admitting: Surgery

## 2021-05-12 ENCOUNTER — Encounter: Payer: Self-pay | Admitting: Family Medicine

## 2021-05-17 DIAGNOSIS — H0288B Meibomian gland dysfunction left eye, upper and lower eyelids: Secondary | ICD-10-CM | POA: Diagnosis not present

## 2021-05-17 DIAGNOSIS — Z83511 Family history of glaucoma: Secondary | ICD-10-CM | POA: Diagnosis not present

## 2021-05-17 DIAGNOSIS — H11123 Conjunctival concretions, bilateral: Secondary | ICD-10-CM | POA: Diagnosis not present

## 2021-05-17 DIAGNOSIS — H43813 Vitreous degeneration, bilateral: Secondary | ICD-10-CM | POA: Diagnosis not present

## 2021-05-17 DIAGNOSIS — H2513 Age-related nuclear cataract, bilateral: Secondary | ICD-10-CM | POA: Diagnosis not present

## 2021-05-17 DIAGNOSIS — H0288A Meibomian gland dysfunction right eye, upper and lower eyelids: Secondary | ICD-10-CM | POA: Diagnosis not present

## 2021-05-17 DIAGNOSIS — H5203 Hypermetropia, bilateral: Secondary | ICD-10-CM | POA: Diagnosis not present

## 2021-05-17 DIAGNOSIS — H35373 Puckering of macula, bilateral: Secondary | ICD-10-CM | POA: Diagnosis not present

## 2021-05-17 DIAGNOSIS — H524 Presbyopia: Secondary | ICD-10-CM | POA: Diagnosis not present

## 2021-05-17 DIAGNOSIS — H52203 Unspecified astigmatism, bilateral: Secondary | ICD-10-CM | POA: Diagnosis not present

## 2021-05-19 ENCOUNTER — Other Ambulatory Visit: Payer: Self-pay | Admitting: Surgery

## 2021-05-19 DIAGNOSIS — Z1231 Encounter for screening mammogram for malignant neoplasm of breast: Secondary | ICD-10-CM

## 2021-06-05 ENCOUNTER — Ambulatory Visit (INDEPENDENT_AMBULATORY_CARE_PROVIDER_SITE_OTHER): Payer: Medicare Other | Admitting: Sports Medicine

## 2021-06-05 ENCOUNTER — Other Ambulatory Visit: Payer: Self-pay

## 2021-06-05 ENCOUNTER — Ambulatory Visit (INDEPENDENT_AMBULATORY_CARE_PROVIDER_SITE_OTHER): Payer: Medicare Other

## 2021-06-05 DIAGNOSIS — M47816 Spondylosis without myelopathy or radiculopathy, lumbar region: Secondary | ICD-10-CM

## 2021-06-05 DIAGNOSIS — M66272 Spontaneous rupture of extensor tendons, left ankle and foot: Secondary | ICD-10-CM

## 2021-06-05 DIAGNOSIS — M19072 Primary osteoarthritis, left ankle and foot: Secondary | ICD-10-CM | POA: Diagnosis not present

## 2021-06-05 DIAGNOSIS — M71572 Other bursitis, not elsewhere classified, left ankle and foot: Secondary | ICD-10-CM | POA: Diagnosis not present

## 2021-06-05 IMAGING — MR MR FOOT*L* W/O CM
5 of 10 series · 19 of 40 positions shown · non-contrast
Comparison: Radiographs [DATE]
COMPARISON: Radiographs [DATE]

Addendum:
CLINICAL DATA: Dorsal foot pain. Possible rupture of the left
fourth extensor tendon.

EXAM:
MRI OF THE LEFT FOOT WITHOUT CONTRAST
TECHNIQUE: Multiplanar, multisequence MR imaging of the left foot was
performed. No intravenous contrast was administered.

[Series 7: T2 fat-sat · axial · 3.0mm · 0.33mm/px · z∈[-45,+26]mm · 3 of 22 slices shown (1 of 4)]
[im 1/22]
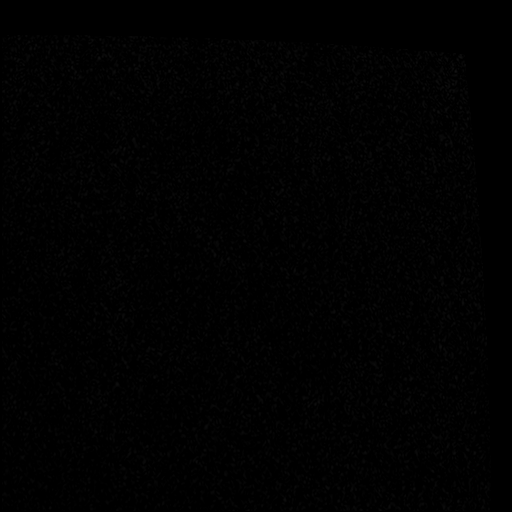
[im 11/22]
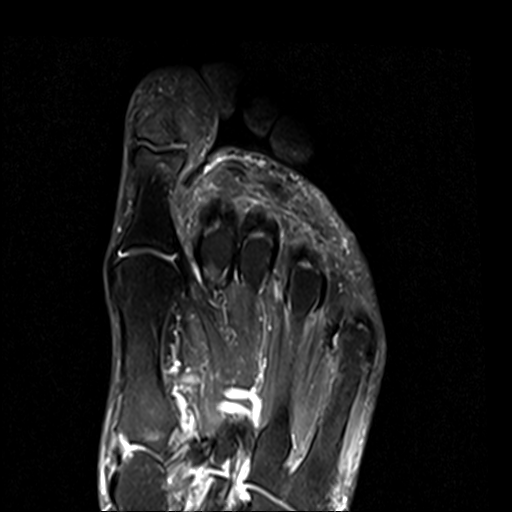
[im 22/22]
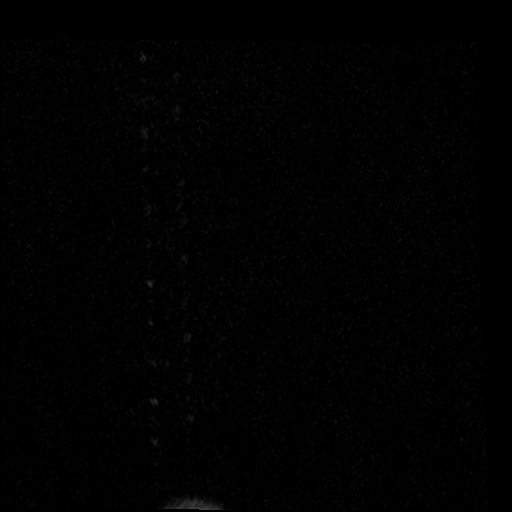

[Series 9: T2 fat-sat · coronal · 3.0mm · 0.23mm/px · 6 of 48 slices shown (2 of 4)]
[im 1/48]
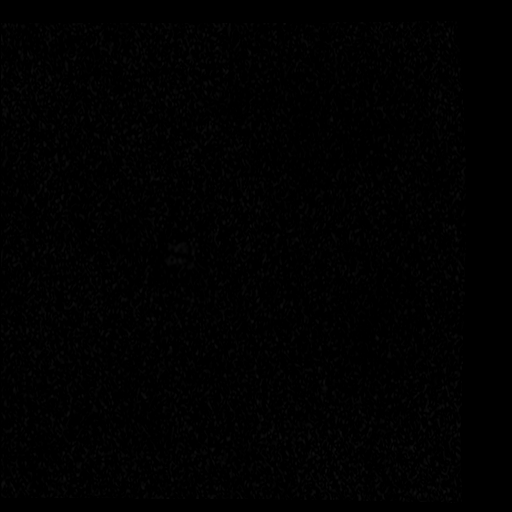
[im 10/48]
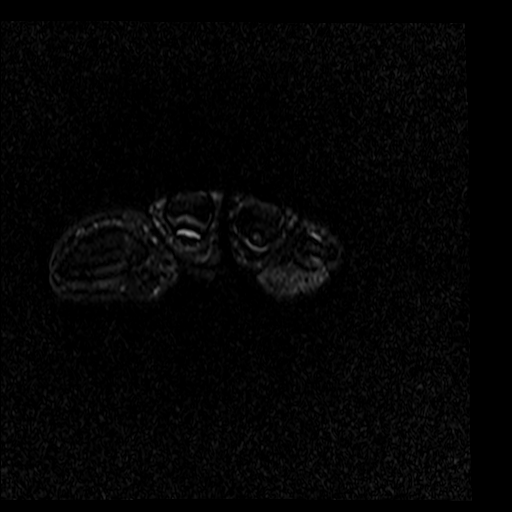
[im 19/48]
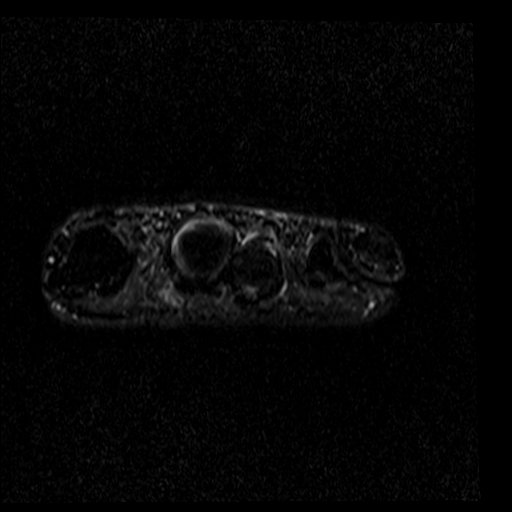
[im 29/48]
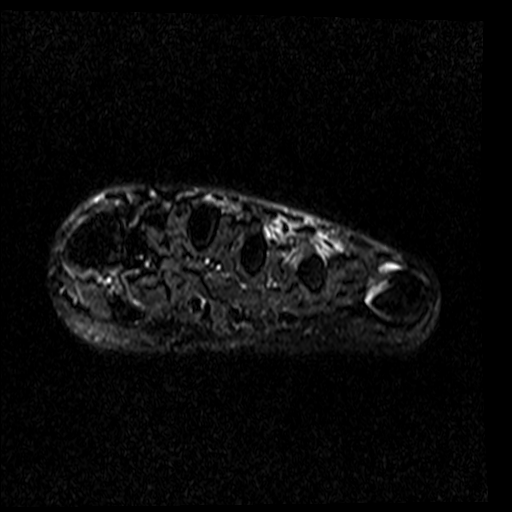
[im 38/48]
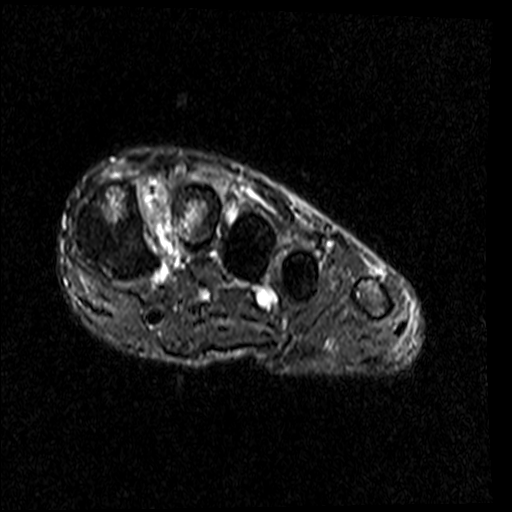
[im 48/48]
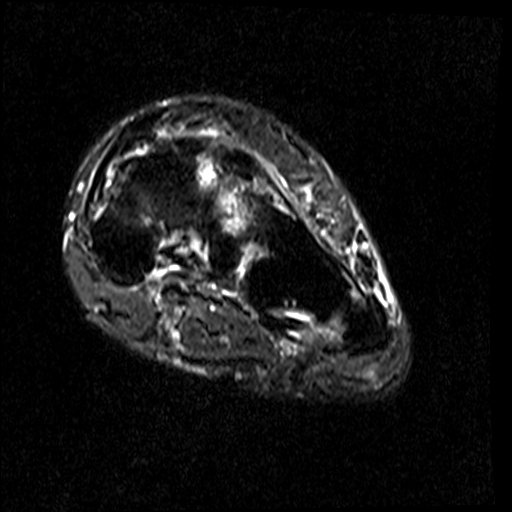

[Series 13: T2 fat-sat · coronal · 2.0mm · 0.20mm/px · 6 of 46 slices shown (3 of 4)]
[im 1/46]
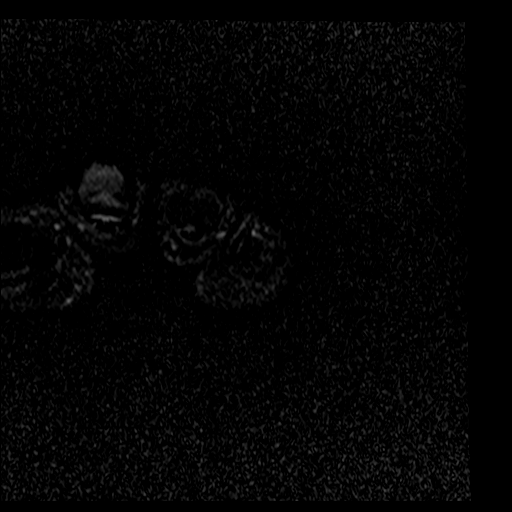
[im 10/46]
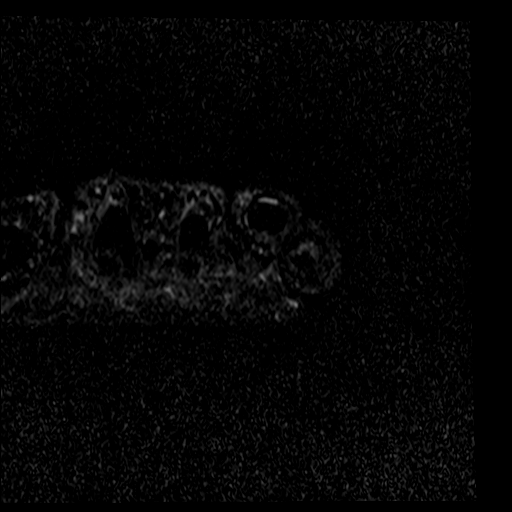
[im 19/46]
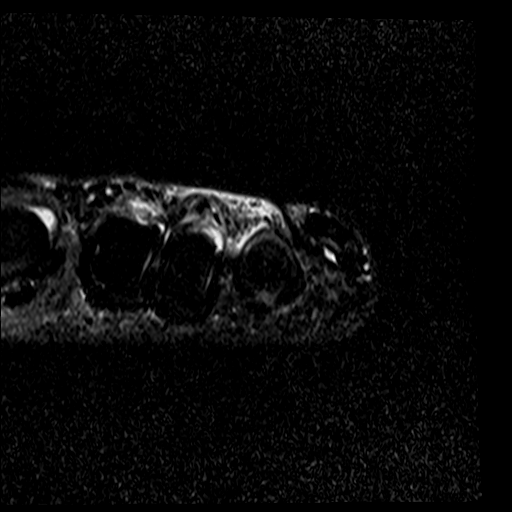
[im 28/46]
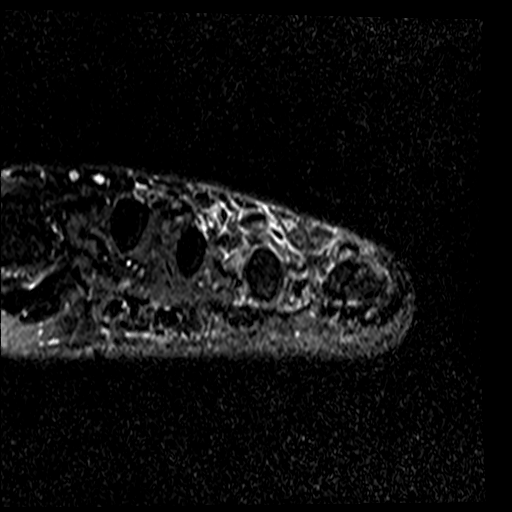
[im 37/46]
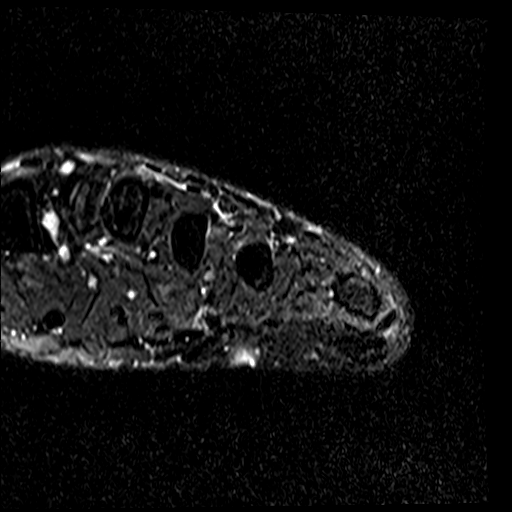
[im 46/46]
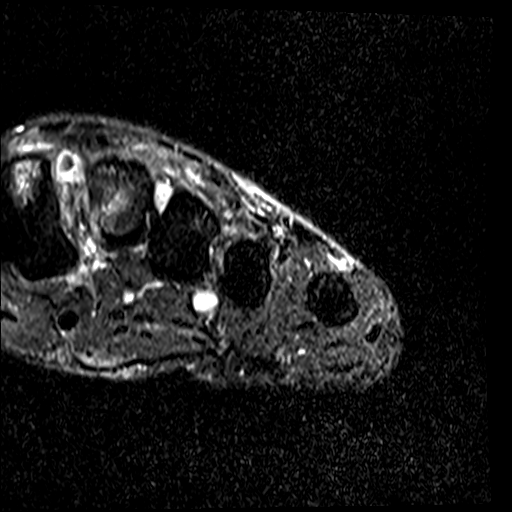

[Series 14: T2 fat-sat · axial · 2.0mm · 0.23mm/px · 1 of 24 slices shown (4 of 4)]
[im 1/24]
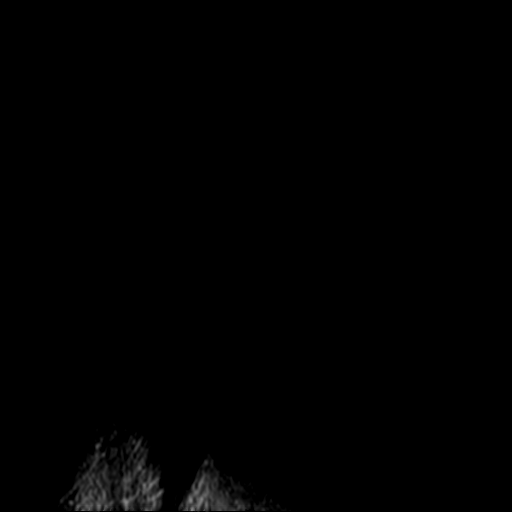

[Series 16: PD fat-sat · sagittal · 2.0mm · 0.25mm/px · 3 of 20 slices shown]
[im 1/20]
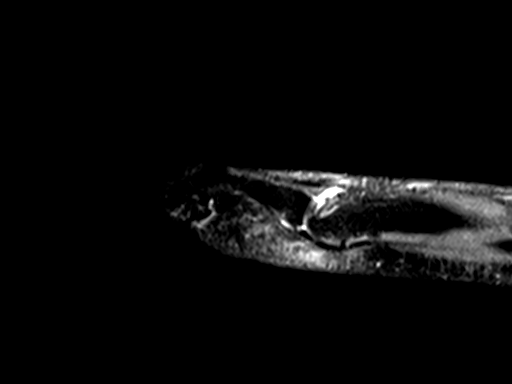
[im 10/20]
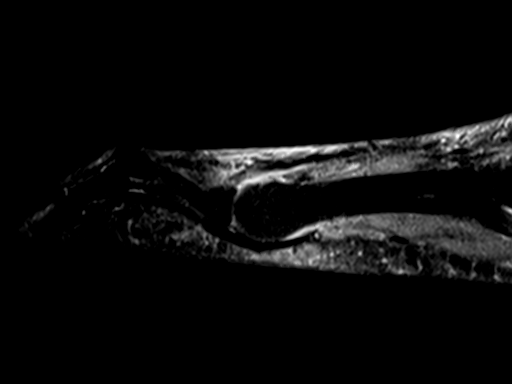
[im 20/20]
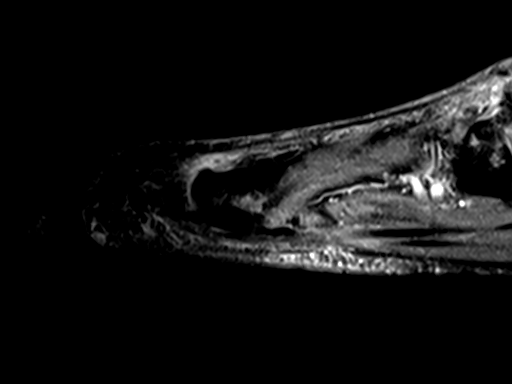

[19 of 40 positions shown; findings below may reference images not displayed]

FINDINGS: Advanced midfoot degenerative changes with joint space narrowing,
joint effusions, osteophytic spurring, subchondral cystic change and
subchondral marrow edema mainly at the tarsal metatarsal joints
medially.

No stress fracture or bone lesion.

Mild intermetatarsal bursitis between the third and fourth
metatarsal heads. The MTP joints are maintained.

The major tendons and ligaments appear intact. Specifically, I do
not see a definite extensor tendon rupture involving the fourth toe.

The foot musculature is unremarkable.  No muscle tear or myositis.
IMPRESSION: 1. Advanced midfoot degenerative changes as described above.
2. No definite extensor tendon rupture.
3. Mild intermetatarsal bursitis between the third and fourth
metatarsal heads.

ADDENDUM:
I reviewed this study with Dr. MCCASKILL. Clinical findings were
very convincing for a ruptured extensor tendon of the fourth toe.
Because of this I had the patient return for additional dedicated
thin slice imaging of the fourth toe. Two repeat thin slice sagittal
sequences were performed angled with the fourth metatarsal and also
angled with the fourth proximal phalanx.

These additional images are convincing for an extensor tendon tear
at the level of the MCP joint. Although I do not see any significant
tendon retraction the proximal portion the tendon does appear
somewhat thickened and is under less tension than the other extensor
tendons. The proximal portion of the tendon may be scarred down
given this is an old injury.

*** End of Addendum ***
FINDINGS: Advanced midfoot degenerative changes with joint space narrowing,
joint effusions, osteophytic spurring, subchondral cystic change and
subchondral marrow edema mainly at the tarsal metatarsal joints
medially.

No stress fracture or bone lesion.

Mild intermetatarsal bursitis between the third and fourth
metatarsal heads. The MTP joints are maintained.

The major tendons and ligaments appear intact. Specifically, I do
not see a definite extensor tendon rupture involving the fourth toe.

The foot musculature is unremarkable.  No muscle tear or myositis.
IMPRESSION: 1. Advanced midfoot degenerative changes as described above.
2. No definite extensor tendon rupture.
3. Mild intermetatarsal bursitis between the third and fourth
metatarsal heads.

## 2021-06-05 IMAGING — DX DG FOOT COMPLETE 3+V*L*
3 series · 3 of 3 positions shown · non-contrast
Comparison: None.

CLINICAL DATA: Left foot pain.

EXAM:
LEFT FOOT - COMPLETE 3+ VIEW

[foot ap]
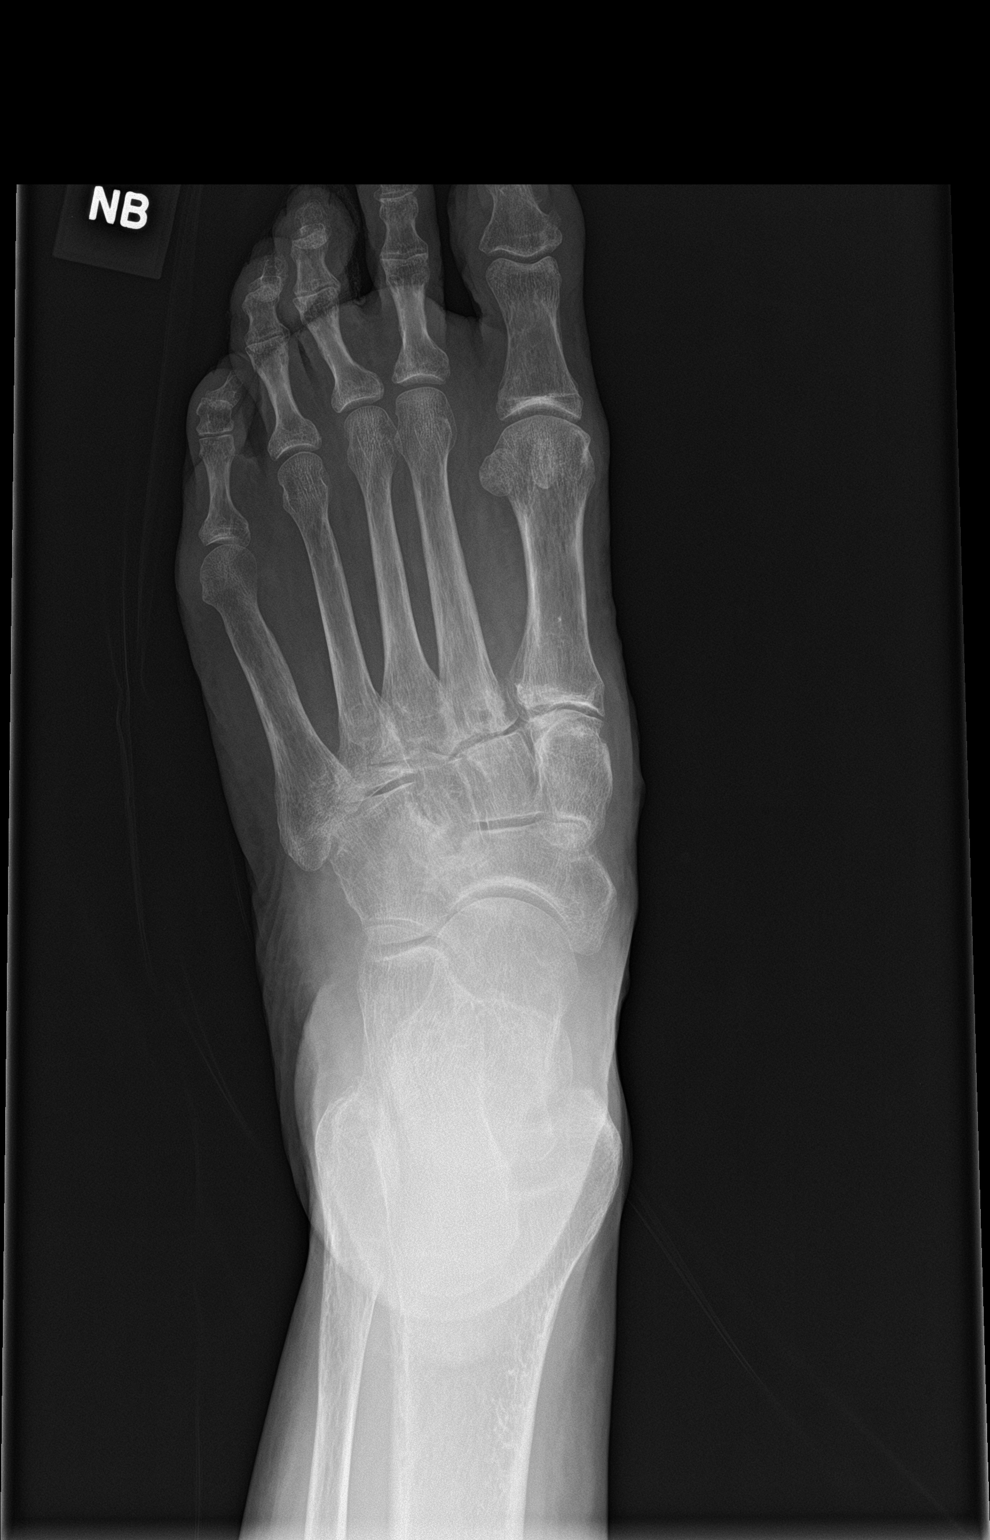

[foot obl]
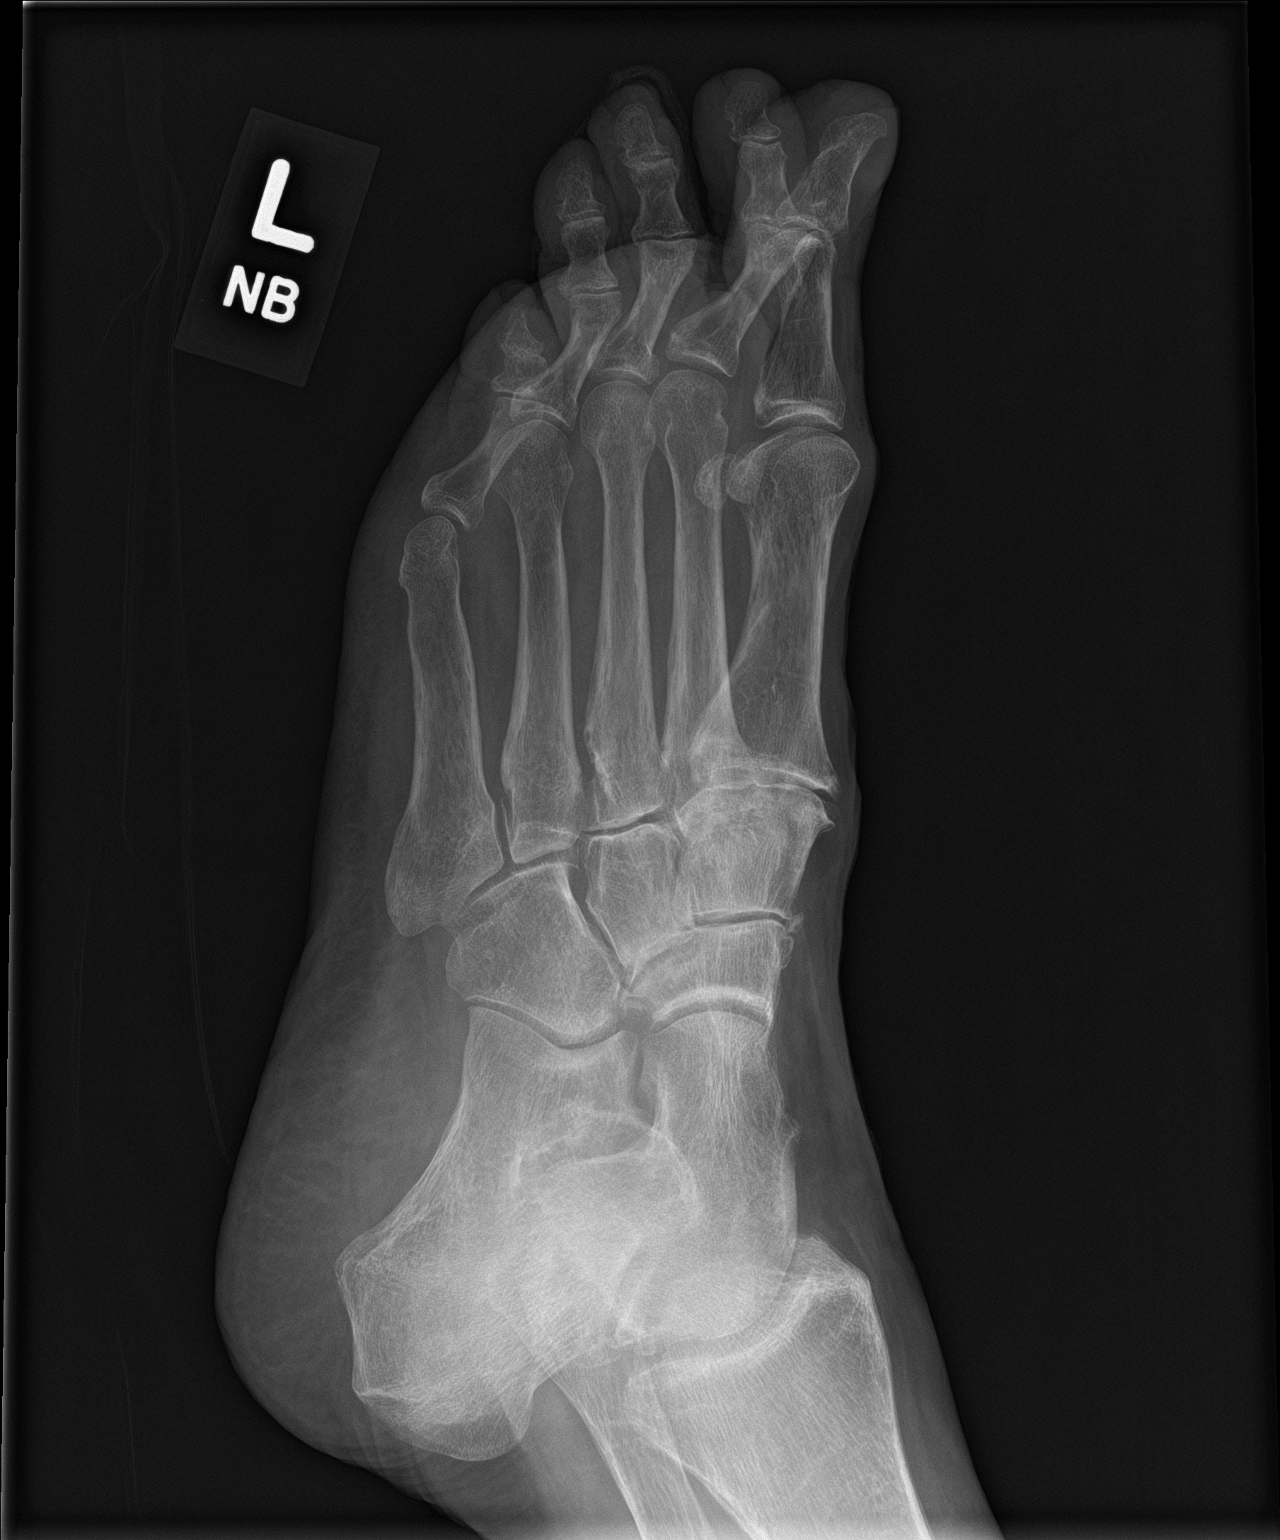

[foot lat]
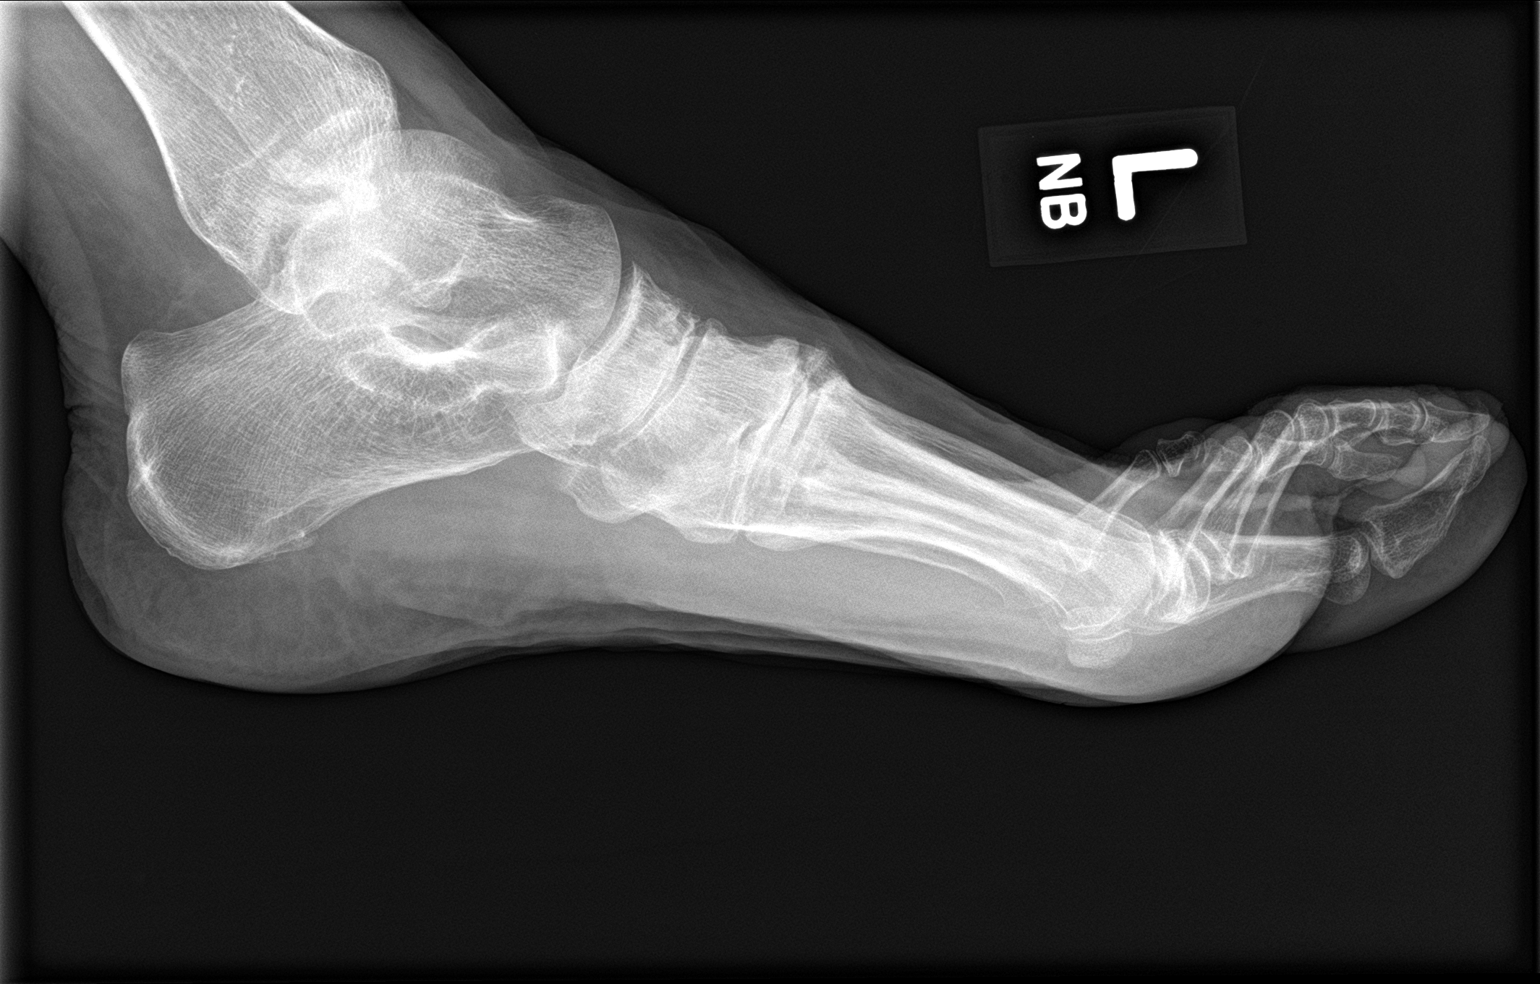

[3 of 3 positions shown; findings below may reference images not displayed]

FINDINGS: Degenerative changes at the tarsal metatarsal joints. No acute bony
findings or destructive bony changes.
IMPRESSION: Degenerative changes but no acute bony findings.

## 2021-06-05 MED ORDER — PREDNISONE 50 MG PO TABS: ORAL_TABLET | ORAL | 0 refills | Status: DC

## 2021-06-05 NOTE — Assessment & Plan Note (Signed)
There is also mild chronic intermittent low back pain, typically does well with an occasional burst of prednisone, she did have hernia surgery so it is tough for her to do her core exercises right now. She does plan to get back into this. I am going to give her a burst of prednisone to keep on hand for when this flares, CT scan from 2020 is the only advanced imaging we have recently, she has very mild multilevel lumbar facet arthritis, discs and spinal canal for the most part look good for her age. Return as needed for this.

## 2021-06-05 NOTE — Progress Notes (Addendum)
    Procedures performed today:    None.  Independent interpretation of notes and tests performed by another provider:   CT scan from 2020 reviewed looking at lumbar spine, she has very mild multilevel facet arthritis, discs and spinal canal look okay.  Update: I did personally review Lori's MRI images with radiology, I think there is a rupture of the extensor digitorum tendon, maybe a couple of fibers hanging on, there is significant T2 edema at the level of the MTP and proximal phalanx, considering physical exam findings we are going to bring her back and get some dedicated images in various angles at this specific location hopefully with thinner slices.  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 On exam she has 0/5 extension strength at the MTP, and interphalangeal joints, the extensor tendon is also not visible suggesting rupture. Adding an x-ray today, MRI today, I buddy taped her third and fourth toes together and I would like Dr. Melony Overly to take a look.  Update: I did personally review Lori's MRI images with radiology, I think there is a rupture of the extensor digitorum tendon, maybe a couple of fibers hanging on, there is significant T2 edema at the level of the MTP and proximal phalanx, considering physical exam findings we are going to bring her back and get some dedicated images in various angles at this specific location hopefully with thinner slices.  Lumbar spondylosis There is also mild chronic intermittent low back pain, typically does well with an occasional burst of prednisone, she did have hernia surgery so it is tough for her to do her core  exercises right now. She does plan to get back into this. I am going to give her a burst of prednisone to keep on hand for when this flares, CT scan from 2020 is the only advanced imaging we have recently, she has very mild multilevel lumbar facet arthritis, discs and spinal canal for the most part look good for her age. Return as needed for this.    ___________________________________________ Gwen Her. Dianah Field, M.D., ABFM., CAQSM. Primary Care and [place] In[employer] of [employer] [facility] [employer]

## 2021-06-05 NOTE — Assessment & Plan Note (Addendum)
This pleasant 70 year old female has noted increasing pain over her left dorsal forefoot, she has had an MTP injection in the fourth MTP with ultrasound guidance in the past, she has more recently noted inability to extend her fourth toe and is tripping over. On exam she has 0/5 extension strength at the MTP, and interphalangeal joints, the extensor tendon is also not visible suggesting rupture. Adding an x-ray today, MRI today, I buddy taped her third and fourth toes together and I would like Dr. Melony Overly to take a look.  Update: I did personally review Lori's MRI images with radiology, I think there is a rupture of the extensor digitorum tendon, maybe a couple of fibers hanging on, there is significant T2 edema at the level of the MTP and proximal phalanx, considering physical exam findings we are going to bring her back and get some dedicated images in various angles at this specific location hopefully with thinner slices.

## 2021-06-09 DIAGNOSIS — M79675 Pain in left toe(s): Secondary | ICD-10-CM | POA: Diagnosis not present

## 2021-06-12 DIAGNOSIS — U071 COVID-19: Secondary | ICD-10-CM | POA: Diagnosis not present

## 2021-06-20 DIAGNOSIS — S96112A Strain of muscle and tendon of long extensor muscle of toe at ankle and foot level, left foot, initial encounter: Secondary | ICD-10-CM | POA: Diagnosis not present

## 2021-06-20 DIAGNOSIS — S96192A Other specified injury of muscle and tendon of long extensor muscle of toe at ankle and foot level, left foot, initial encounter: Secondary | ICD-10-CM | POA: Diagnosis not present

## 2021-06-30 ENCOUNTER — Ambulatory Visit
Admission: RE | Admit: 2021-06-30 | Discharge: 2021-06-30 | Disposition: A | Discharge status: Home / Self Care | Payer: Medicare Other | Source: Ambulatory Visit | Attending: Surgery | Admitting: Surgery

## 2021-06-30 ENCOUNTER — Other Ambulatory Visit: Payer: Self-pay

## 2021-06-30 DIAGNOSIS — Z1231 Encounter for screening mammogram for malignant neoplasm of breast: Secondary | ICD-10-CM | POA: Diagnosis not present

## 2021-06-30 IMAGING — MG MM DIGITAL SCREENING BILAT W/ TOMO AND CAD
8 series · 8 of 24 positions shown · non-contrast
Comparison: Previous exam(s).

CLINICAL DATA: Screening.

EXAM:
DIGITAL SCREENING BILATERAL MAMMOGRAM WITH TOMOSYNTHESIS AND CAD
TECHNIQUE: Bilateral screening digital craniocaudal and mediolateral oblique
mammograms were obtained. Bilateral screening digital breast
tomosynthesis was performed. The images were evaluated with
computer-aided detection.

[R MLO synth-2D]
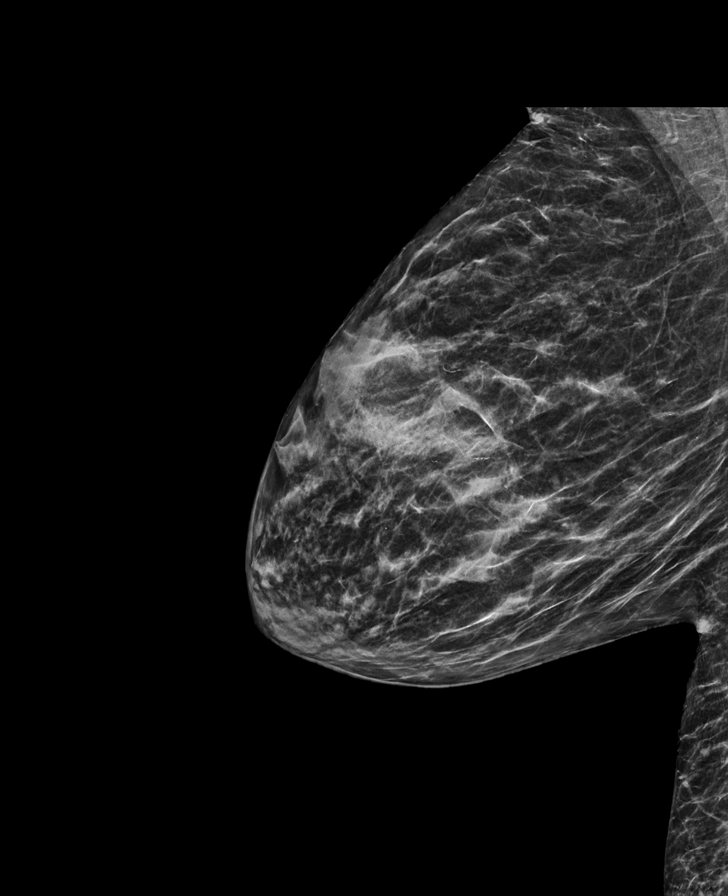

[R CC synth-2D]
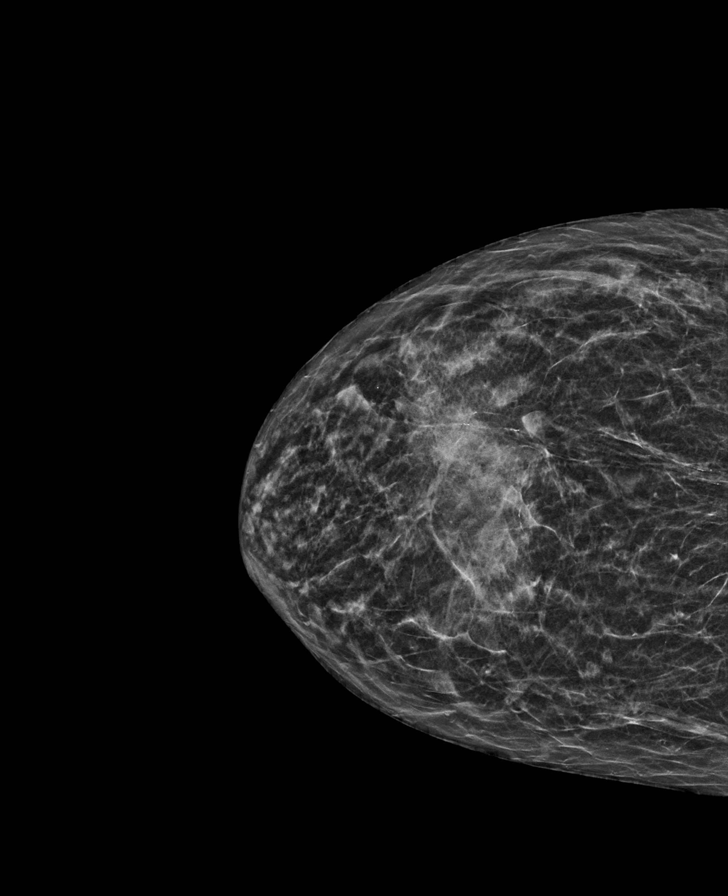

[L MLO synth-2D]
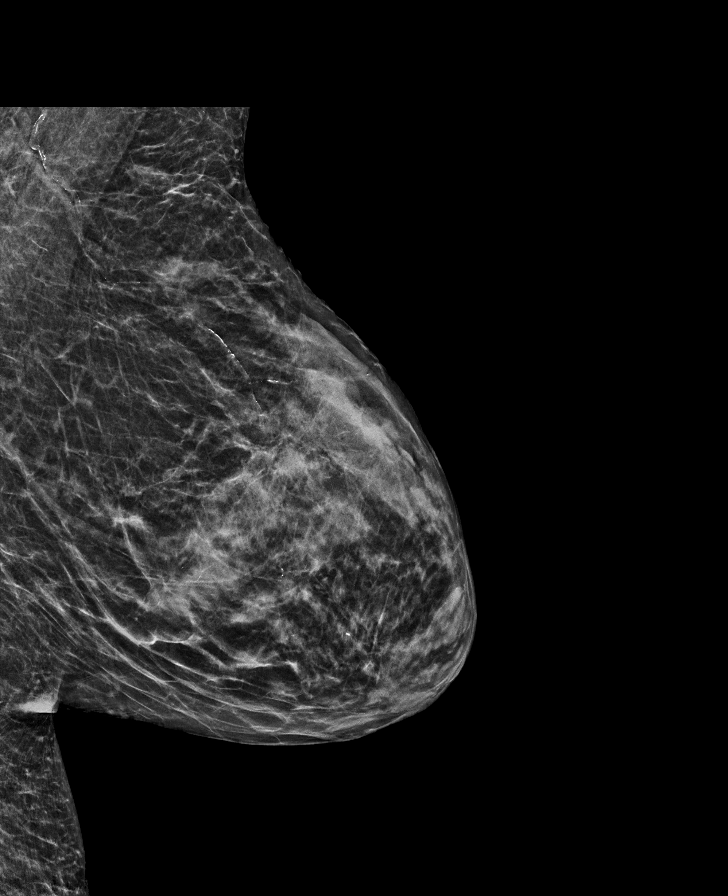

[L CC synth-2D]
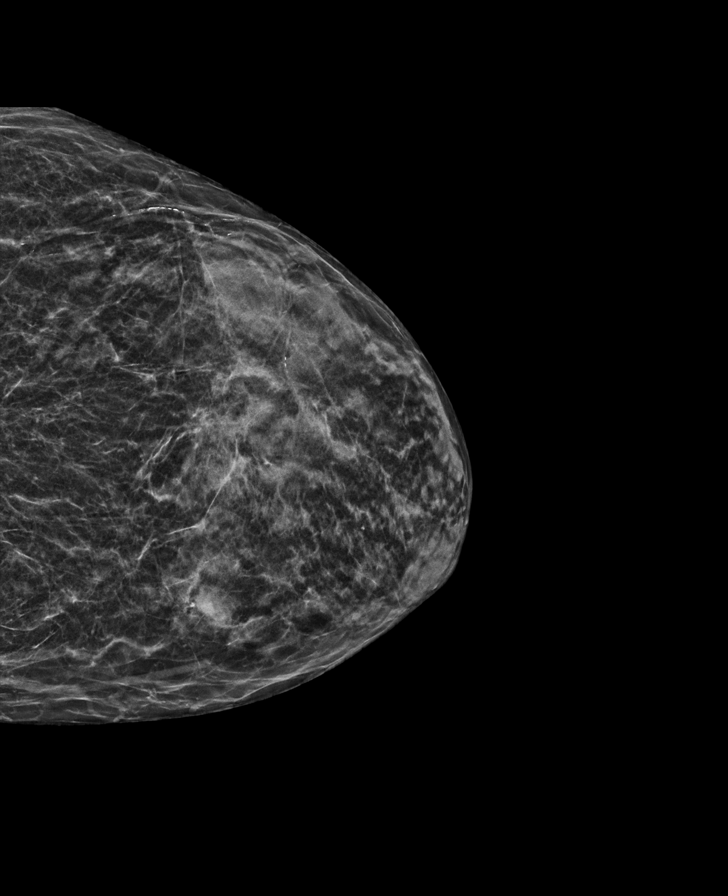

[R CC tomo · tomo slice 23/45.0]
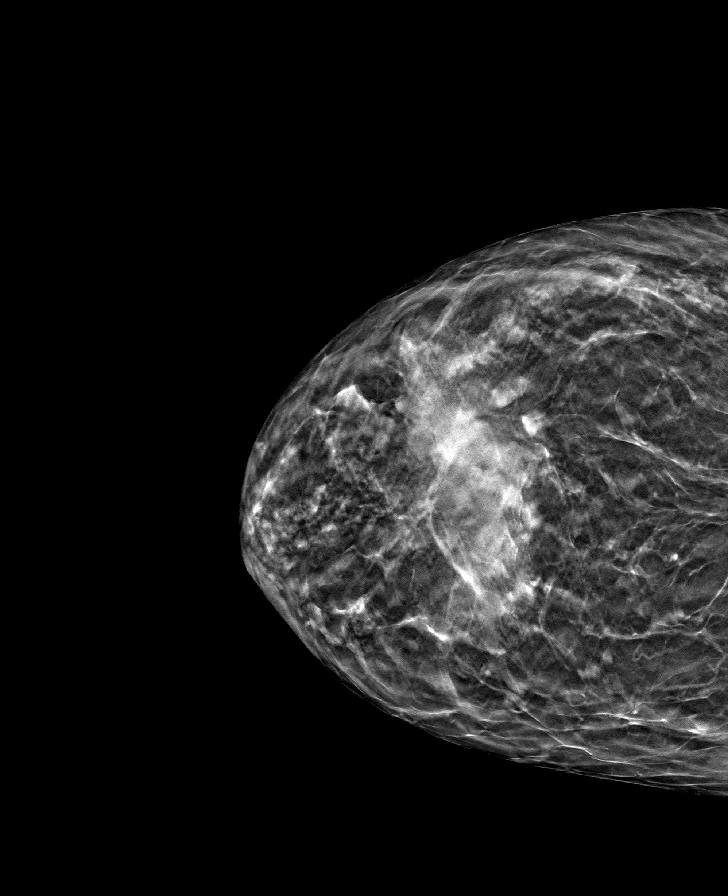

[L MLO tomo · tomo slice 25/50.0]
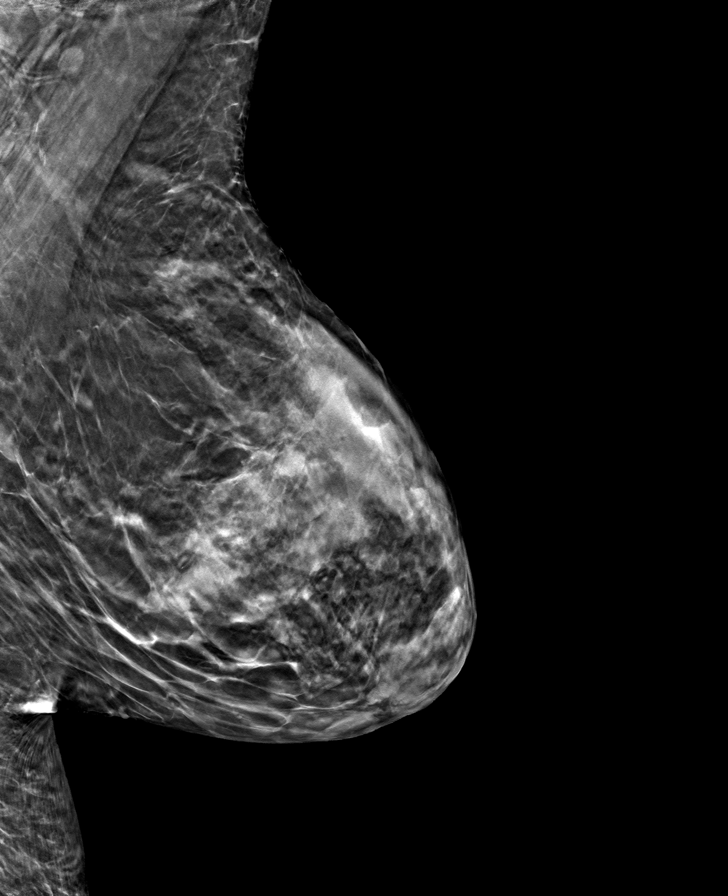

[R MLO tomo · tomo slice 25/49.0]
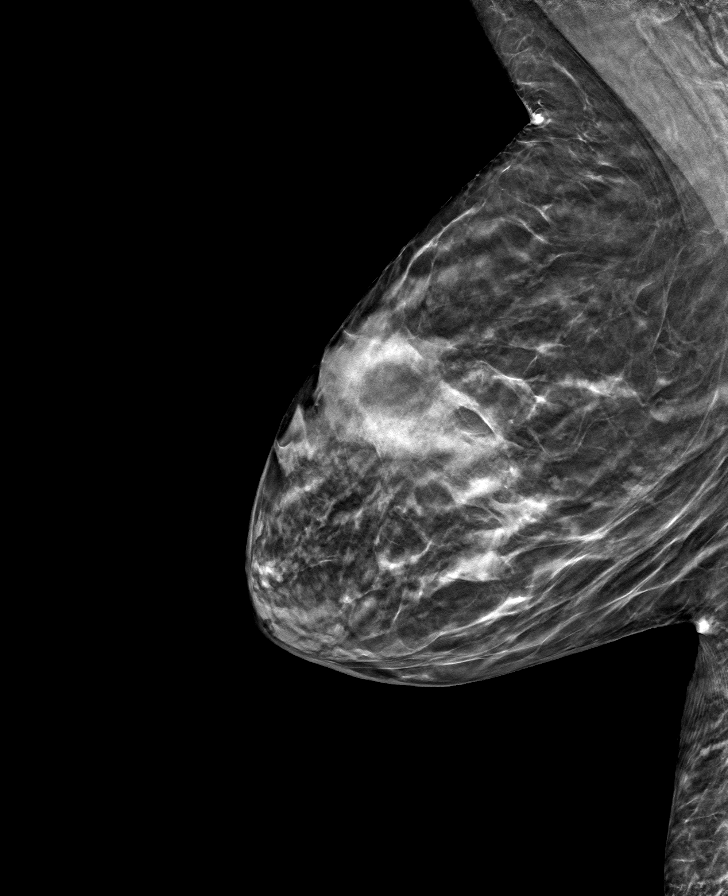

[L CC tomo · tomo slice 22/43.0]
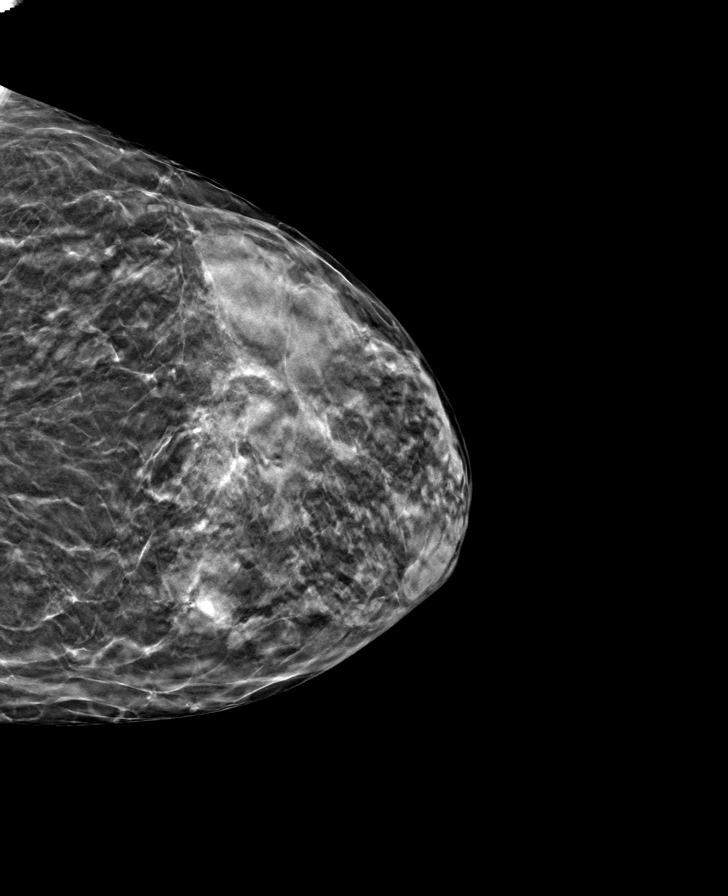

[8 of 24 positions shown; findings below may reference images not displayed]

ACR Breast Density Category c: The breast tissue is heterogeneously
dense, which may obscure small masses.
FINDINGS: In the left breast, a possible asymmetry warrants further
evaluation. In the right breast, no findings suspicious for
malignancy.
IMPRESSION: Further evaluation is suggested for possible asymmetry in the left
breast.

RECOMMENDATION:
Diagnostic mammogram and possibly ultrasound of the left breast.
(Code:[WN])

The patient will be contacted regarding the findings, and additional
imaging will be scheduled.

BI-RADS CATEGORY  0: Incomplete. Need additional imaging evaluation
and/or prior mammograms for comparison.

## 2021-07-04 ENCOUNTER — Other Ambulatory Visit: Payer: Self-pay | Admitting: Family Medicine

## 2021-07-04 ENCOUNTER — Ambulatory Visit
Admission: RE | Admit: 2021-07-04 | Discharge: 2021-07-04 | Disposition: A | Discharge status: Home / Self Care | Payer: Medicare Other | Source: Ambulatory Visit | Attending: Family Medicine | Admitting: Family Medicine

## 2021-07-04 ENCOUNTER — Other Ambulatory Visit: Payer: Self-pay

## 2021-07-04 DIAGNOSIS — N6489 Other specified disorders of breast: Secondary | ICD-10-CM

## 2021-07-04 DIAGNOSIS — R922 Inconclusive mammogram: Secondary | ICD-10-CM | POA: Diagnosis not present

## 2021-07-04 IMAGING — MG MM DIGITAL DIAGNOSTIC UNILAT*L* W/ TOMO W/ CAD
6 series · 6 of 18 positions shown · non-contrast
Comparison: Previous exams including recent screening mammogram
dated [DATE].

CLINICAL DATA: Patient returns today to evaluate a possible LEFT
breast asymmetry questioned on recent screening mammogram.

EXAM:
DIGITAL DIAGNOSTIC UNILATERAL LEFT MAMMOGRAM WITH TOMOSYNTHESIS AND
CAD; ULTRASOUND LEFT BREAST LIMITED
TECHNIQUE: Left digital diagnostic mammography and breast tomosynthesis was
performed. The images were evaluated with computer-aided detection.;
Targeted ultrasound examination of the left breast was performed

[L MLO synth-2D (1 of 2)]
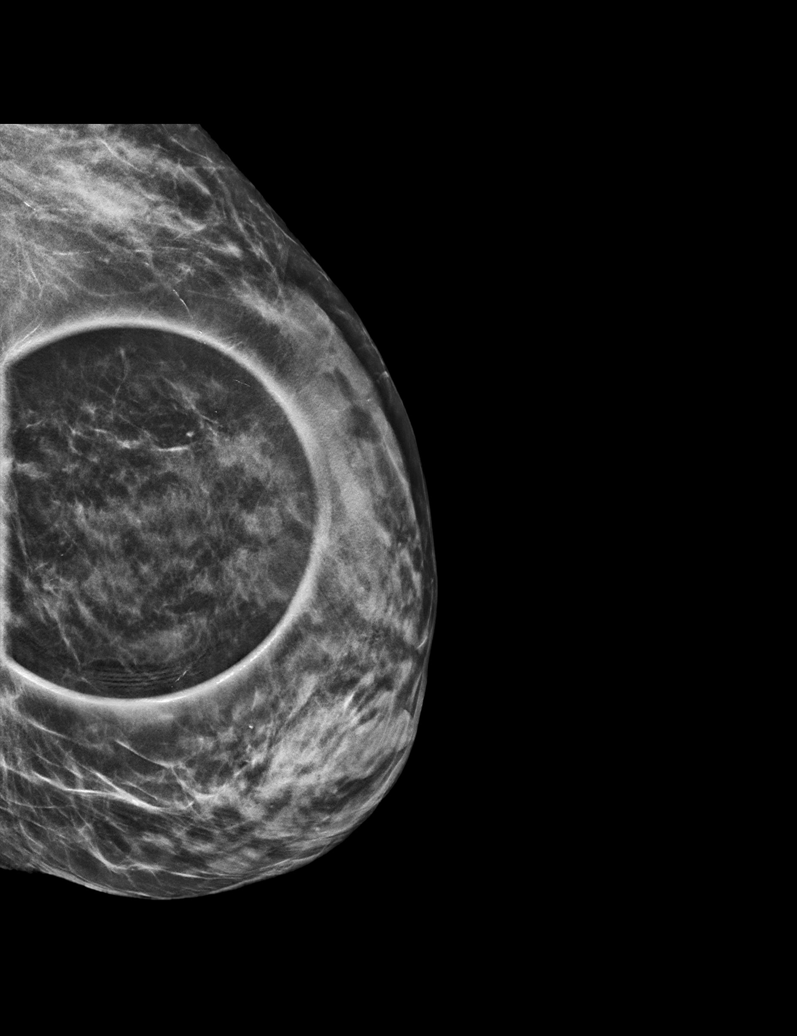

[L ML synth-2D]
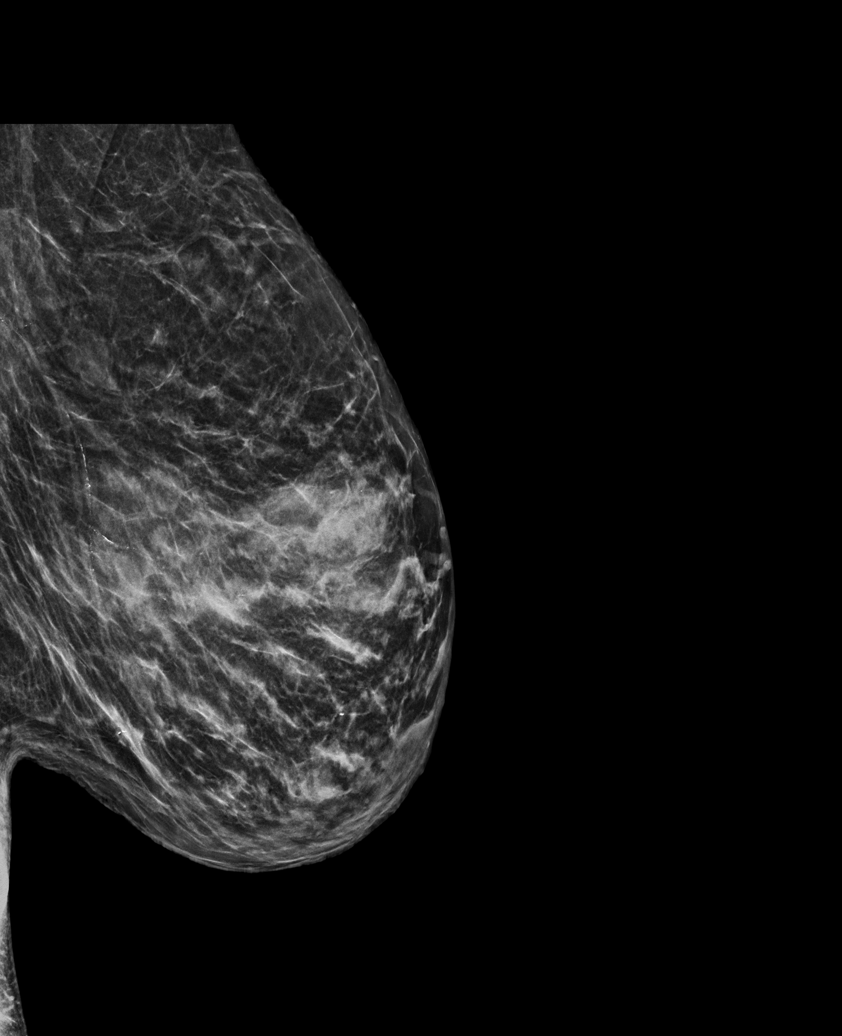

[L MLO synth-2D (2 of 2)]
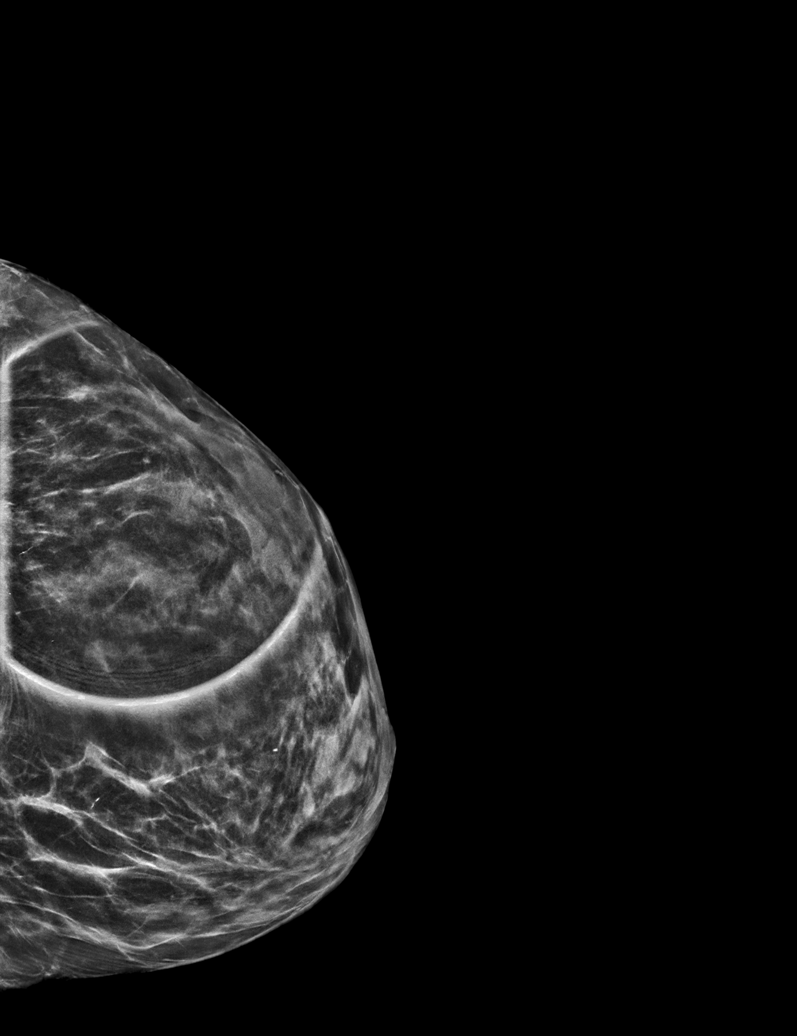

[L MLO tomo (1 of 2) · tomo slice 25/48.0]
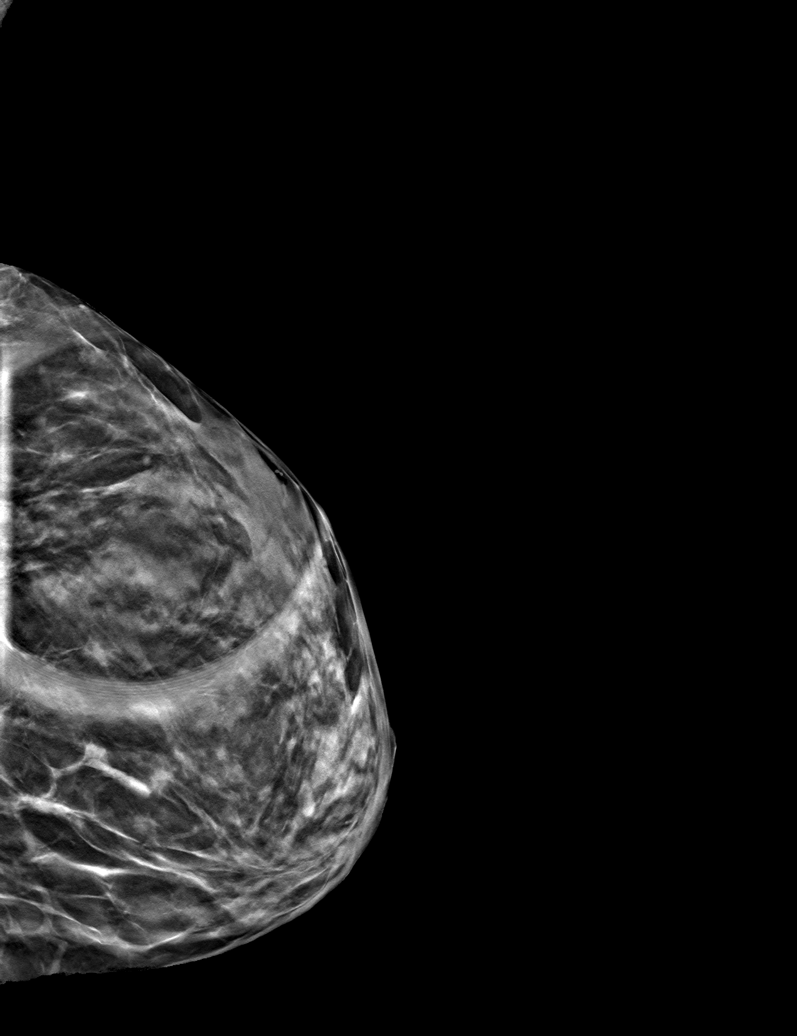

[L ML tomo · tomo slice 26/51.0]
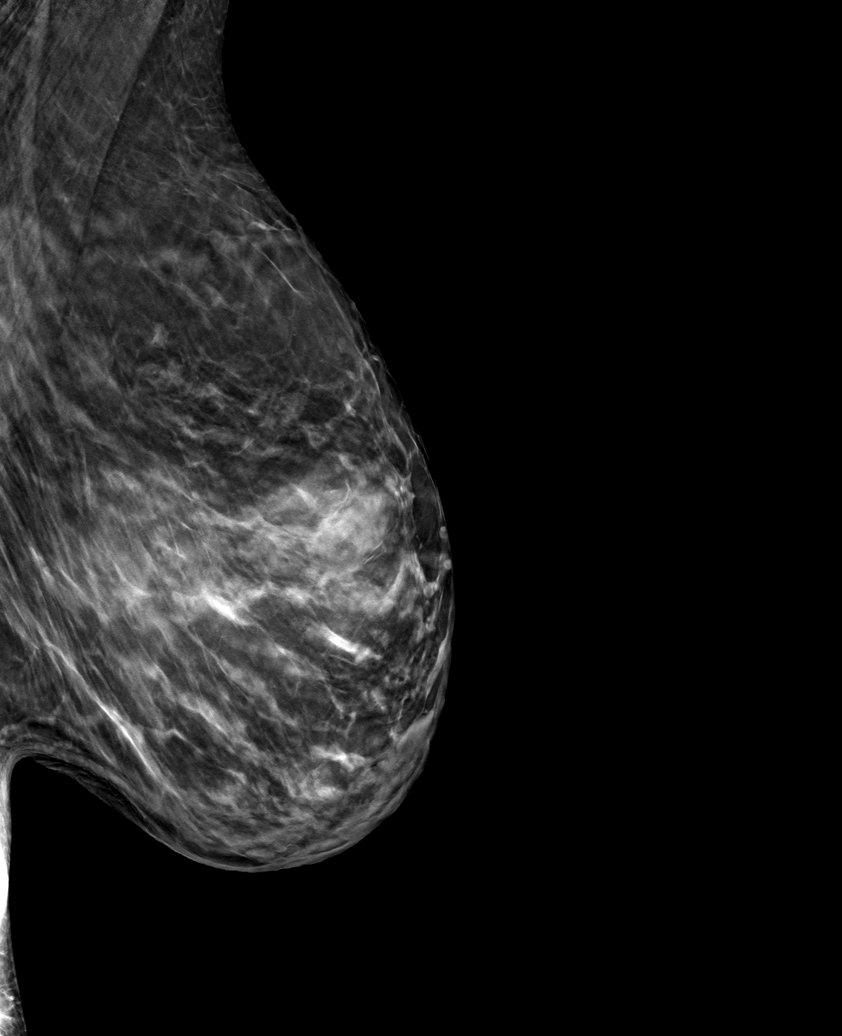

[L MLO tomo (2 of 2) · tomo slice 21/42.0]
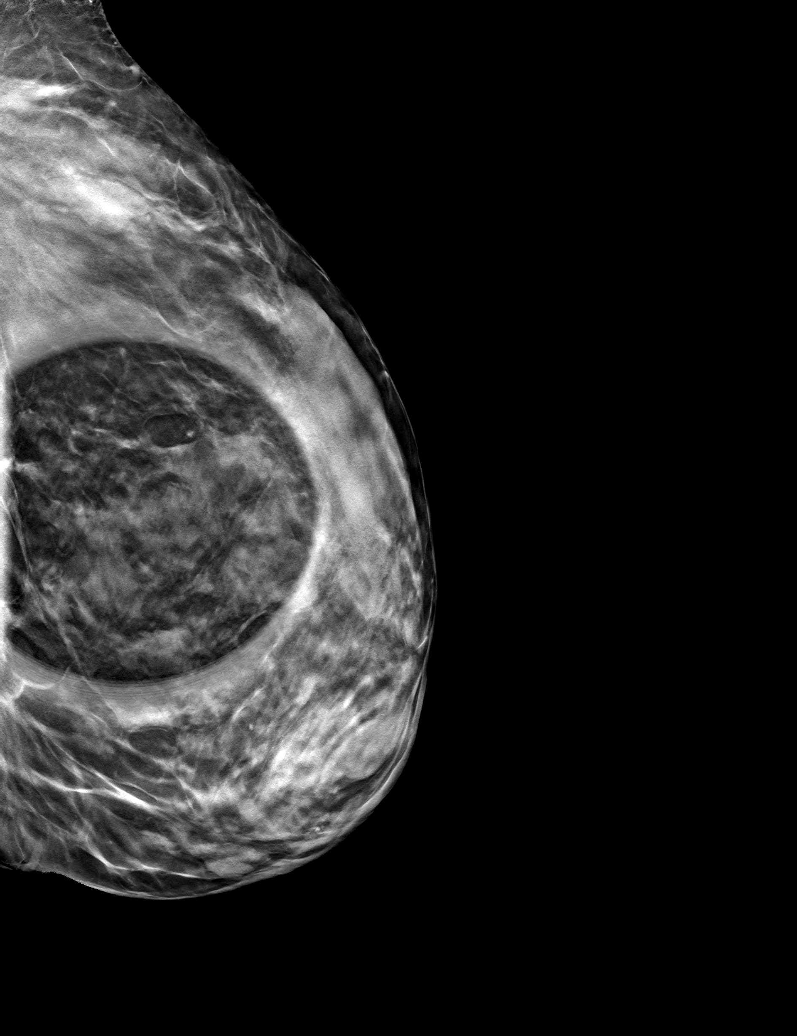

[6 of 18 positions shown; findings below may reference images not displayed]

ACR Breast Density Category c: The breast tissue is heterogeneously
dense, which may obscure small masses.
FINDINGS: LEFT breast diagnostic mammogram: On today's additional diagnostic
views, there is no persistent asymmetry within the upper LEFT breast
indicating superimposition of normal dense fibroglandular tissues.
Due to the heterogeneously dense breast tissues in this area,
ultrasound will be performed to ensure benignity.

Targeted ultrasound is performed, evaluating the entire upper LEFT
breast, showing only normal fibroglandular tissues and fat lobules
throughout. No solid or cystic mass.
IMPRESSION: No evidence of malignancy within the LEFT breast.

Patient may return to routine annual bilateral screening mammogram
schedule.

RECOMMENDATION:
Screening mammogram in one year.(Code:[YQ])

I have discussed the findings and recommendations with the patient.
If applicable, a reminder letter will be sent to the patient
regarding the next appointment.

BI-RADS CATEGORY  1: Negative.

## 2021-07-04 IMAGING — US US BREAST*L* LIMITED INC AXILLA
1 series · 5 of 5 positions shown · non-contrast
Comparison: Previous exams including recent screening mammogram
dated [DATE].

CLINICAL DATA: Patient returns today to evaluate a possible LEFT
breast asymmetry questioned on recent screening mammogram.

EXAM:
DIGITAL DIAGNOSTIC UNILATERAL LEFT MAMMOGRAM WITH TOMOSYNTHESIS AND
CAD; ULTRASOUND LEFT BREAST LIMITED
TECHNIQUE: Left digital diagnostic mammography and breast tomosynthesis was
performed. The images were evaluated with computer-aided detection.;
Targeted ultrasound examination of the left breast was performed

[Series 1: us breast*left* limited inc axilla · 0.05mm/px · 5 of 5 slices shown]
[im 1/5]
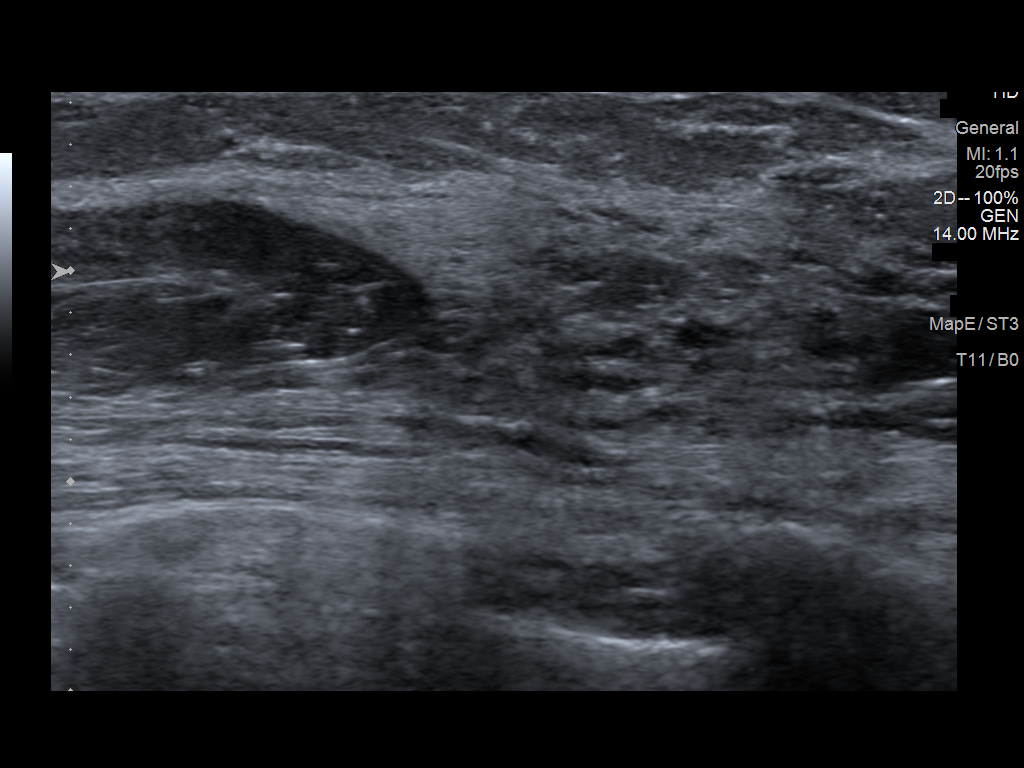
[im 2/5]
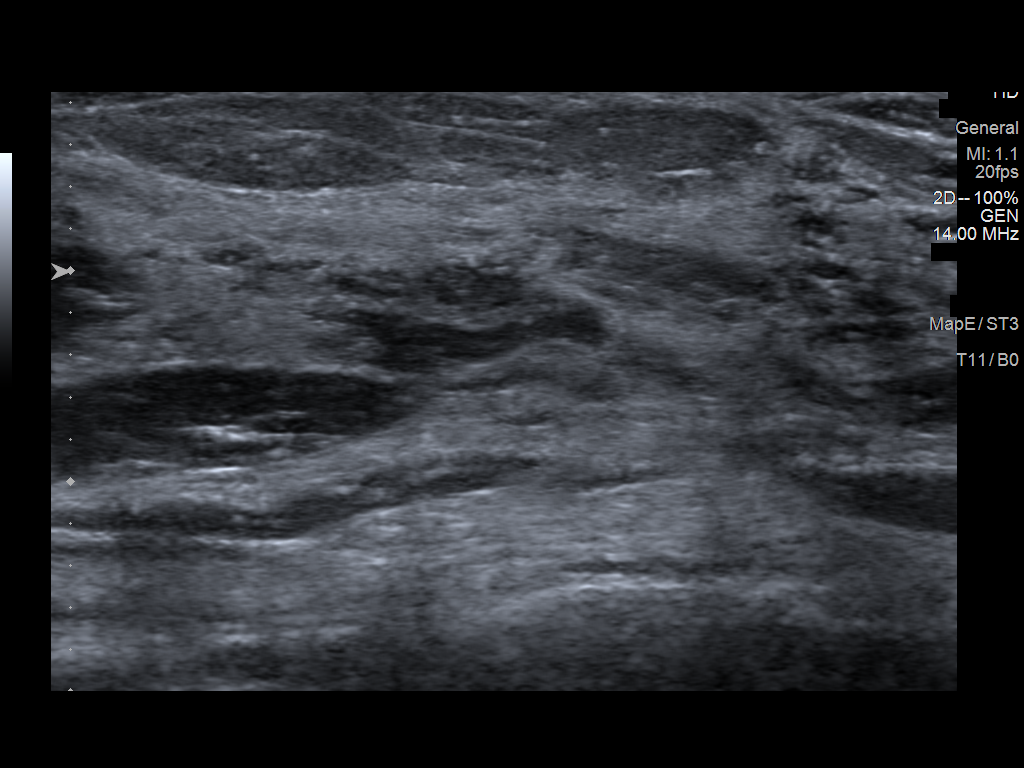
[im 3/5]
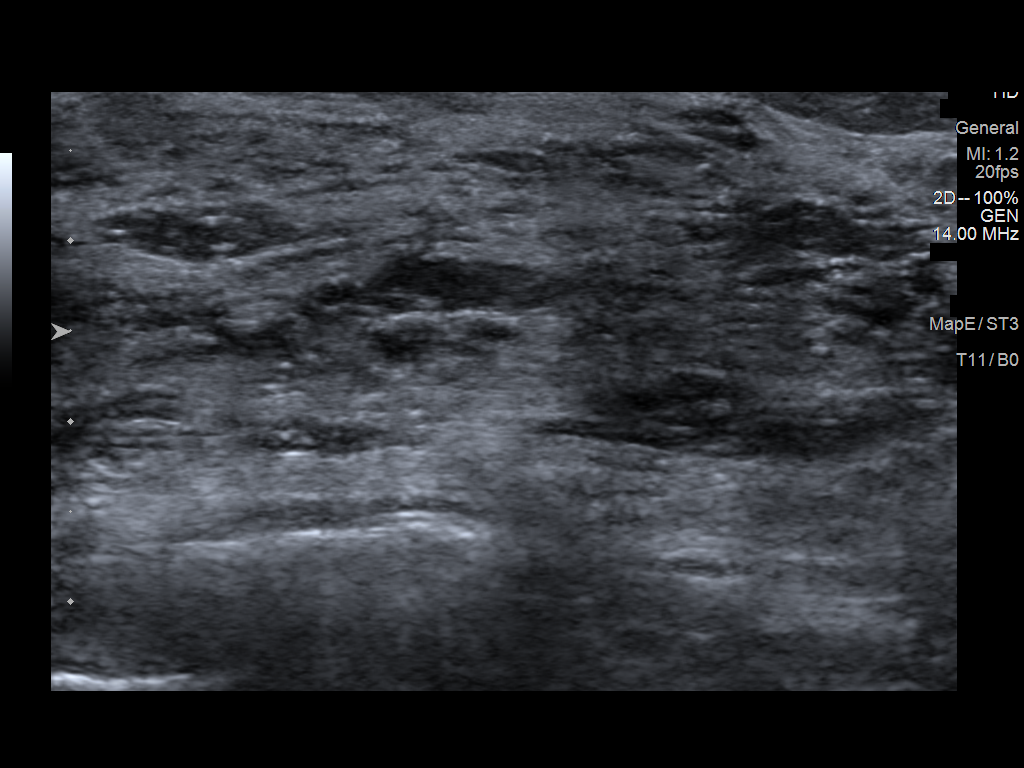
[im 4/5]
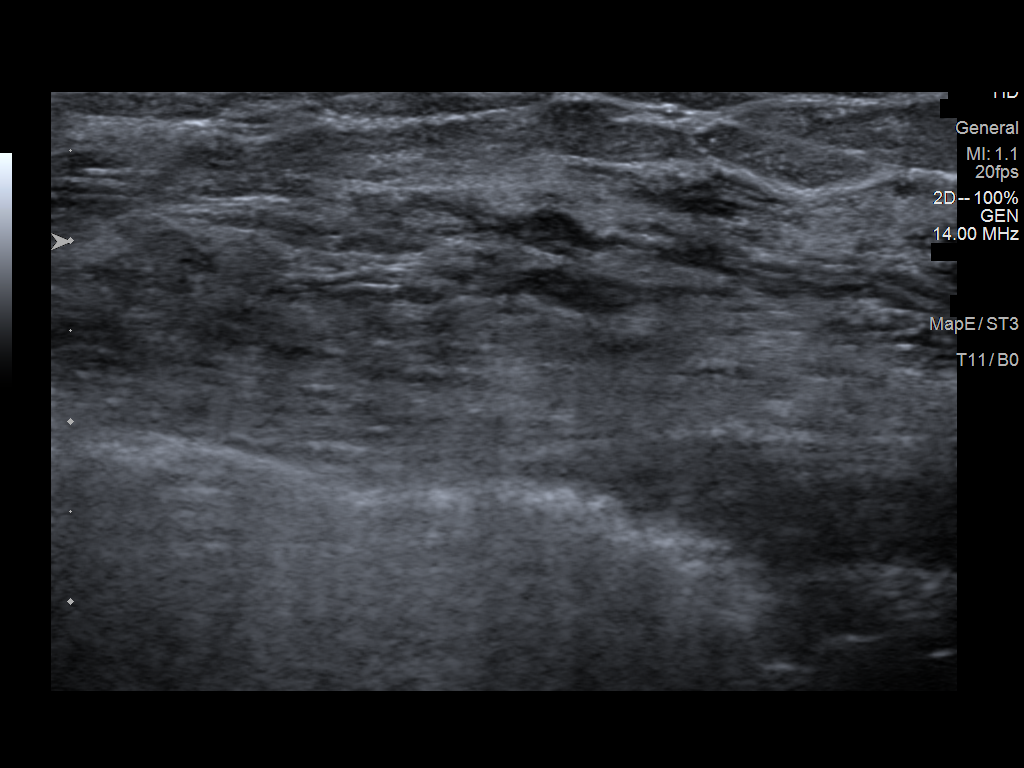
[im 5/5]
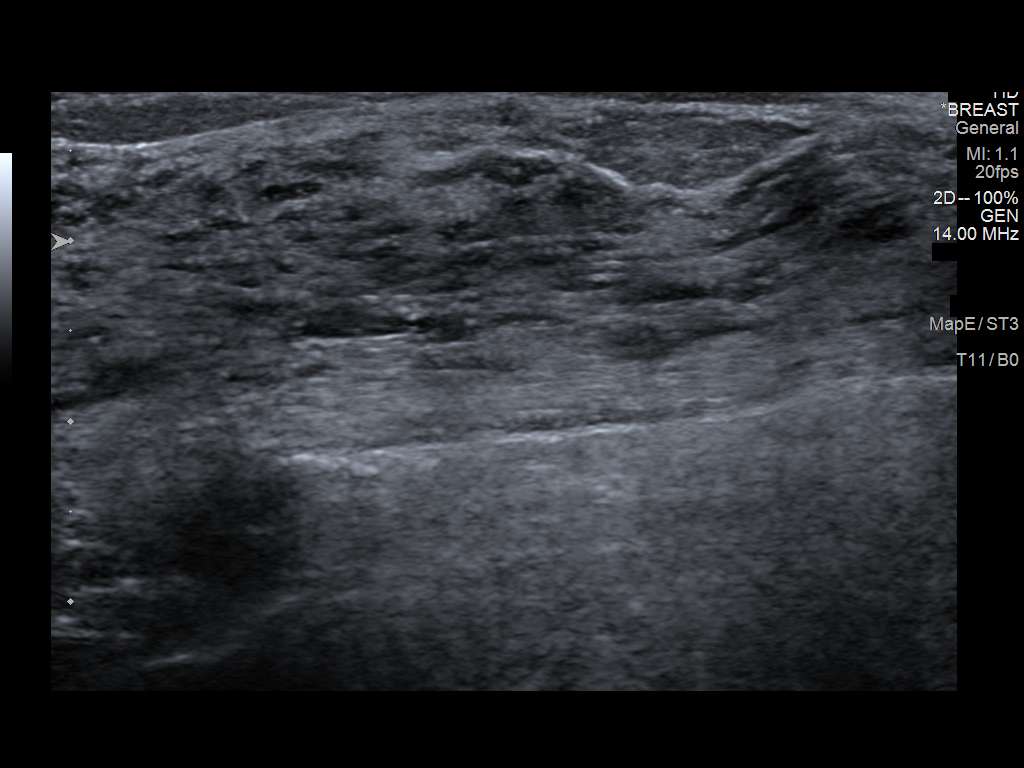

[5 of 5 positions shown; findings below may reference images not displayed]

ACR Breast Density Category c: The breast tissue is heterogeneously
dense, which may obscure small masses.
FINDINGS: LEFT breast diagnostic mammogram: On today's additional diagnostic
views, there is no persistent asymmetry within the upper LEFT breast
indicating superimposition of normal dense fibroglandular tissues.
Due to the heterogeneously dense breast tissues in this area,
ultrasound will be performed to ensure benignity.

Targeted ultrasound is performed, evaluating the entire upper LEFT
breast, showing only normal fibroglandular tissues and fat lobules
throughout. No solid or cystic mass.
IMPRESSION: No evidence of malignancy within the LEFT breast.

Patient may return to routine annual bilateral screening mammogram
schedule.

RECOMMENDATION:
Screening mammogram in one year.(Code:[YQ])

I have discussed the findings and recommendations with the patient.
If applicable, a reminder letter will be sent to the patient
regarding the next appointment.

BI-RADS CATEGORY  1: Negative.

## 2021-07-05 ENCOUNTER — Ambulatory Visit: Payer: Medicare Other

## 2021-07-13 ENCOUNTER — Ambulatory Visit: Payer: Medicare Other

## 2021-07-20 DIAGNOSIS — U071 COVID-19: Secondary | ICD-10-CM | POA: Diagnosis not present

## 2021-07-25 DIAGNOSIS — H35373 Puckering of macula, bilateral: Secondary | ICD-10-CM | POA: Diagnosis not present

## 2021-07-25 DIAGNOSIS — H2513 Age-related nuclear cataract, bilateral: Secondary | ICD-10-CM | POA: Insufficient documentation

## 2021-07-25 DIAGNOSIS — H35342 Macular cyst, hole, or pseudohole, left eye: Secondary | ICD-10-CM | POA: Diagnosis not present

## 2021-08-07 DIAGNOSIS — D1801 Hemangioma of skin and subcutaneous tissue: Secondary | ICD-10-CM | POA: Diagnosis not present

## 2021-08-07 DIAGNOSIS — L309 Dermatitis, unspecified: Secondary | ICD-10-CM | POA: Diagnosis not present

## 2021-08-07 DIAGNOSIS — L400 Psoriasis vulgaris: Secondary | ICD-10-CM | POA: Diagnosis not present

## 2021-08-07 DIAGNOSIS — D692 Other nonthrombocytopenic purpura: Secondary | ICD-10-CM | POA: Diagnosis not present

## 2021-09-22 DIAGNOSIS — K7400 Hepatic fibrosis, unspecified: Secondary | ICD-10-CM | POA: Diagnosis not present

## 2021-09-22 DIAGNOSIS — K766 Portal hypertension: Secondary | ICD-10-CM | POA: Diagnosis not present

## 2021-09-23 DIAGNOSIS — Z23 Encounter for immunization: Secondary | ICD-10-CM | POA: Diagnosis not present

## 2021-10-03 DIAGNOSIS — Z23 Encounter for immunization: Secondary | ICD-10-CM | POA: Diagnosis not present

## 2021-10-17 DIAGNOSIS — K769 Liver disease, unspecified: Secondary | ICD-10-CM | POA: Diagnosis not present

## 2021-10-17 DIAGNOSIS — D472 Monoclonal gammopathy: Secondary | ICD-10-CM | POA: Diagnosis not present

## 2021-10-17 DIAGNOSIS — K766 Portal hypertension: Secondary | ICD-10-CM | POA: Diagnosis not present

## 2021-10-17 DIAGNOSIS — K7400 Hepatic fibrosis, unspecified: Secondary | ICD-10-CM | POA: Diagnosis not present

## 2021-10-20 DIAGNOSIS — K769 Liver disease, unspecified: Secondary | ICD-10-CM | POA: Diagnosis not present

## 2021-10-26 DIAGNOSIS — U071 COVID-19: Secondary | ICD-10-CM | POA: Diagnosis not present

## 2021-12-08 ENCOUNTER — Other Ambulatory Visit: Payer: Self-pay | Admitting: Family Medicine

## 2021-12-08 MED ORDER — TRIAMCINOLONE ACETONIDE 0.1 % EX CREA: 1.0000 "application " | TOPICAL_CREAM | Freq: Every day | CUTANEOUS | 3 refills | Status: DC | PRN

## 2021-12-14 DIAGNOSIS — L308 Other specified dermatitis: Secondary | ICD-10-CM | POA: Diagnosis not present

## 2021-12-14 DIAGNOSIS — L578 Other skin changes due to chronic exposure to nonionizing radiation: Secondary | ICD-10-CM | POA: Diagnosis not present

## 2021-12-14 DIAGNOSIS — L720 Epidermal cyst: Secondary | ICD-10-CM | POA: Diagnosis not present

## 2021-12-14 DIAGNOSIS — L404 Guttate psoriasis: Secondary | ICD-10-CM | POA: Diagnosis not present

## 2021-12-14 DIAGNOSIS — L400 Psoriasis vulgaris: Secondary | ICD-10-CM | POA: Diagnosis not present

## 2021-12-14 DIAGNOSIS — W908XXS Exposure to other nonionizing radiation, sequela: Secondary | ICD-10-CM | POA: Diagnosis not present

## 2021-12-14 DIAGNOSIS — L820 Inflamed seborrheic keratosis: Secondary | ICD-10-CM | POA: Diagnosis not present

## 2021-12-14 DIAGNOSIS — L821 Other seborrheic keratosis: Secondary | ICD-10-CM | POA: Diagnosis not present

## 2021-12-22 DIAGNOSIS — U071 COVID-19: Secondary | ICD-10-CM | POA: Diagnosis not present

## 2022-01-25 DIAGNOSIS — L308 Other specified dermatitis: Secondary | ICD-10-CM | POA: Diagnosis not present

## 2022-01-25 DIAGNOSIS — L404 Guttate psoriasis: Secondary | ICD-10-CM | POA: Diagnosis not present

## 2022-01-25 DIAGNOSIS — L089 Local infection of the skin and subcutaneous tissue, unspecified: Secondary | ICD-10-CM | POA: Diagnosis not present

## 2022-01-25 DIAGNOSIS — D1801 Hemangioma of skin and subcutaneous tissue: Secondary | ICD-10-CM | POA: Diagnosis not present

## 2022-01-26 DIAGNOSIS — U071 COVID-19: Secondary | ICD-10-CM | POA: Diagnosis not present

## 2022-03-01 DIAGNOSIS — M25562 Pain in left knee: Secondary | ICD-10-CM | POA: Diagnosis not present

## 2022-03-07 ENCOUNTER — Telehealth: Payer: Self-pay | Admitting: Sports Medicine

## 2022-03-07 DIAGNOSIS — U071 COVID-19: Secondary | ICD-10-CM | POA: Diagnosis not present

## 2022-03-07 NOTE — Telephone Encounter (Signed)
This patient did left knee Orthovisc back in the spring of last year and did really well, lets get this approved again, x-ray confirmed osteoarthritis, failed greater than 6 weeks of conservative treatment including steroid injections, NSAIDs, activity modification.  Good response to Orthovisc previously. ?

## 2022-03-07 NOTE — Telephone Encounter (Signed)
[employer] paperwork faxed to [employer] at [phone] ?Request is for Orthovisc or Monovisc ?Pt's insurance prefers Orthovisc ?Fax confirmation receipt received  ?

## 2022-03-07 NOTE — Telephone Encounter (Signed)
Benefits Investigation Details received from [employer] ?Injection: Orthovisc ? ?Medical: Primary:Deductible applies. Once deductible is met, patient is responsible for coinsurance. Prior authorization is not required. Secondary: Follows Medicare guidelines. It will pick up the remaining eligible expenses at 100%. It does not cover the Medicare Part B deductible.  ?PA required: No ? ?Pharmacy:  ? ?Specialty Pharmacy:  ? ?May fill through: [place] ?OV Copay/Coinsurance: 20% ?Product Copay: 20% ?Administration Coinsurance: 20% ?Administration Copay:  ?Deductible: $226 (met: $104) ? ?  ?

## 2022-03-08 NOTE — Telephone Encounter (Signed)
Spoke with patient, she is aware that her secondary insurance will pick up the cost except for the remaining $122 of the deductible. She wishes to proceed and was transferred tot he front desk to schedule.  ?

## 2022-03-13 ENCOUNTER — Other Ambulatory Visit: Payer: Self-pay

## 2022-03-13 ENCOUNTER — Ambulatory Visit (INDEPENDENT_AMBULATORY_CARE_PROVIDER_SITE_OTHER): Payer: Medicare Other | Admitting: Sports Medicine

## 2022-03-13 ENCOUNTER — Ambulatory Visit (INDEPENDENT_AMBULATORY_CARE_PROVIDER_SITE_OTHER): Payer: Medicare Other

## 2022-03-13 DIAGNOSIS — M1712 Unilateral primary osteoarthritis, left knee: Secondary | ICD-10-CM

## 2022-03-13 NOTE — Addendum Note (Signed)
Addended by: Gust Brooms on: 03/13/2022 03:11 PM ? ? Modules accepted: Orders ? ?

## 2022-03-13 NOTE — Progress Notes (Signed)
? ? ?  Procedures performed today:   ? ?Procedure: Real-time Ultrasound Guided injection of the left knee ?Device: Samsung HS60  ?Verbal informed consent obtained.  ?Time-out conducted.  ?Noted no overlying erythema, induration, or other signs of local infection.  ?Skin prepped in a sterile fashion.  ?Local anesthesia: Topical Ethyl chloride.  ?With sterile technique and under real time ultrasound guidance: Noted trace effusion, 30 mg/2 mL of OrthoVisc (sodium hyaluronate) in a prefilled syringe was injected easily into the knee through a 22-gauge needle. ?Completed without difficulty  ?Advised to call if fevers/chills, erythema, induration, drainage, or persistent bleeding.  ?Images permanently stored and available for review in PACS.  ?Impression: Technically successful ultrasound guided injection. ? ?Independent interpretation of notes and tests performed by another provider:  ? ?None. ? ?Brief History, Exam, Impression, and Recommendations:   ? ?Primary osteoarthritis of left knee ?Orthovisc No. 1 of 4 left knee, return in 1 week for #2 of 4, she did really well with Orthovisc about a year ago. ? ? ? ?___________________________________________ ?Gwen Her. Dianah Field, M.D., ABFM., CAQSM. ?Primary Care and Sports Medicine ?[place] ? ?Adjunct Instructor of Family Medicine  ?[employer] ?

## 2022-03-13 NOTE — Assessment & Plan Note (Signed)
Orthovisc No. 1 of 4 left knee, return in 1 week for #2 of 4, she did really well with Orthovisc about a year ago. ?

## 2022-03-22 DIAGNOSIS — K766 Portal hypertension: Secondary | ICD-10-CM | POA: Diagnosis not present

## 2022-03-23 ENCOUNTER — Encounter: Payer: Self-pay | Admitting: Sports Medicine

## 2022-03-26 ENCOUNTER — Ambulatory Visit (INDEPENDENT_AMBULATORY_CARE_PROVIDER_SITE_OTHER): Payer: Medicare Other

## 2022-03-26 ENCOUNTER — Ambulatory Visit (INDEPENDENT_AMBULATORY_CARE_PROVIDER_SITE_OTHER): Payer: Medicare Other | Admitting: Sports Medicine

## 2022-03-26 DIAGNOSIS — M1712 Unilateral primary osteoarthritis, left knee: Secondary | ICD-10-CM | POA: Diagnosis not present

## 2022-03-26 NOTE — Assessment & Plan Note (Signed)
Orthovisc 2 of 4 left knee, return in 1 week for #3 of 4 

## 2022-03-26 NOTE — Progress Notes (Signed)
? ? ?  Procedures performed today:   ? ?Procedure: Real-time Ultrasound Guided injection of the left knee ?Device: Samsung HS60  ?Verbal informed consent obtained.  ?Time-out conducted.  ?Noted no overlying erythema, induration, or other signs of local infection.  ?Skin prepped in a sterile fashion.  ?Local anesthesia: Topical Ethyl chloride.  ?With sterile technique and under real time ultrasound guidance: Noted mild effusion, 30 mg/2 mL of OrthoVisc (sodium hyaluronate) in a prefilled syringe was injected easily into the knee through a 22-gauge needle. ?Completed without difficulty  ?Advised to call if fevers/chills, erythema, induration, drainage, or persistent bleeding.  ?Images permanently stored and available for review in PACS.  ?Impression: Technically successful ultrasound guided injection. ? ?Independent interpretation of notes and tests performed by another provider:  ? ?None. ? ?Brief History, Exam, Impression, and Recommendations:   ? ?Primary osteoarthritis of left knee ?Orthovisc 2 of 4 left knee, return in 1 week for #3 of 4. ? ? ? ?___________________________________________ ?Gwen Her. Dianah Field, M.D., ABFM., CAQSM. ?Primary Care and Sports Medicine ?[place] ? ?Adjunct Instructor of Family Medicine  ?[employer] ?

## 2022-03-29 DIAGNOSIS — R799 Abnormal finding of blood chemistry, unspecified: Secondary | ICD-10-CM | POA: Diagnosis not present

## 2022-03-29 DIAGNOSIS — Z888 Allergy status to other drugs, medicaments and biological substances status: Secondary | ICD-10-CM | POA: Diagnosis not present

## 2022-03-29 DIAGNOSIS — K766 Portal hypertension: Secondary | ICD-10-CM | POA: Diagnosis not present

## 2022-03-29 DIAGNOSIS — Z882 Allergy status to sulfonamides status: Secondary | ICD-10-CM | POA: Diagnosis not present

## 2022-03-29 DIAGNOSIS — K7400 Hepatic fibrosis, unspecified: Secondary | ICD-10-CM | POA: Diagnosis not present

## 2022-03-29 DIAGNOSIS — Z881 Allergy status to other antibiotic agents status: Secondary | ICD-10-CM | POA: Diagnosis not present

## 2022-03-29 DIAGNOSIS — Z885 Allergy status to narcotic agent status: Secondary | ICD-10-CM | POA: Diagnosis not present

## 2022-03-30 DIAGNOSIS — R799 Abnormal finding of blood chemistry, unspecified: Secondary | ICD-10-CM | POA: Diagnosis not present

## 2022-03-30 DIAGNOSIS — K7400 Hepatic fibrosis, unspecified: Secondary | ICD-10-CM | POA: Diagnosis not present

## 2022-03-30 DIAGNOSIS — K766 Portal hypertension: Secondary | ICD-10-CM | POA: Diagnosis not present

## 2022-03-31 ENCOUNTER — Other Ambulatory Visit: Payer: Self-pay | Admitting: Sports Medicine

## 2022-03-31 DIAGNOSIS — M47816 Spondylosis without myelopathy or radiculopathy, lumbar region: Secondary | ICD-10-CM

## 2022-04-02 ENCOUNTER — Ambulatory Visit (INDEPENDENT_AMBULATORY_CARE_PROVIDER_SITE_OTHER): Payer: Medicare Other | Admitting: Sports Medicine

## 2022-04-02 ENCOUNTER — Ambulatory Visit (INDEPENDENT_AMBULATORY_CARE_PROVIDER_SITE_OTHER): Payer: Medicare Other

## 2022-04-02 DIAGNOSIS — M1712 Unilateral primary osteoarthritis, left knee: Secondary | ICD-10-CM

## 2022-04-02 DIAGNOSIS — M79672 Pain in left foot: Secondary | ICD-10-CM

## 2022-04-02 IMAGING — DX DG FOOT COMPLETE 3+V*L*
3 series · 3 of 3 positions shown · non-contrast
Comparison: Left foot radiographs [DATE]

CLINICAL DATA: Question stress injury fourth metatarsal proximal.
Pain for 1 week. Walks 5-6 miles a day.

EXAM:
LEFT FOOT - COMPLETE 3+ VIEW

[foot ap]
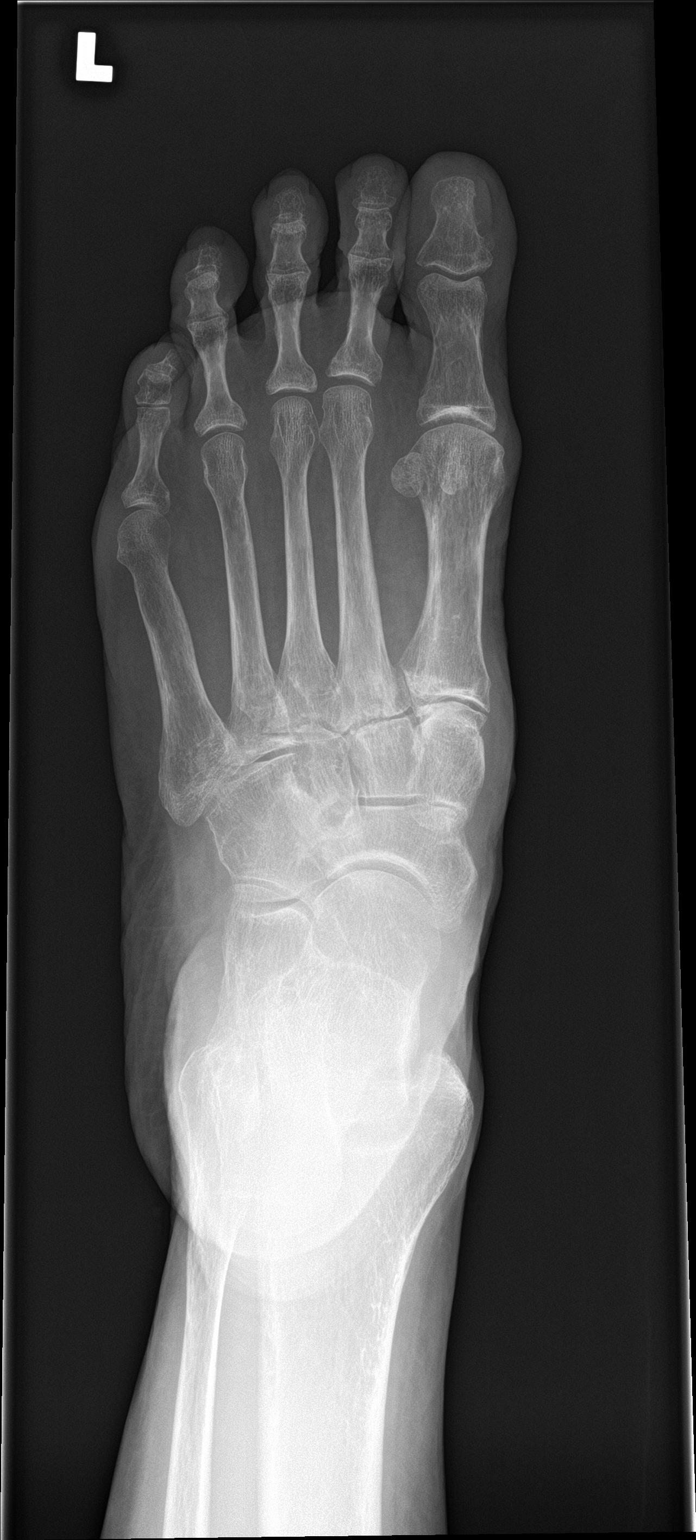

[foot obl]
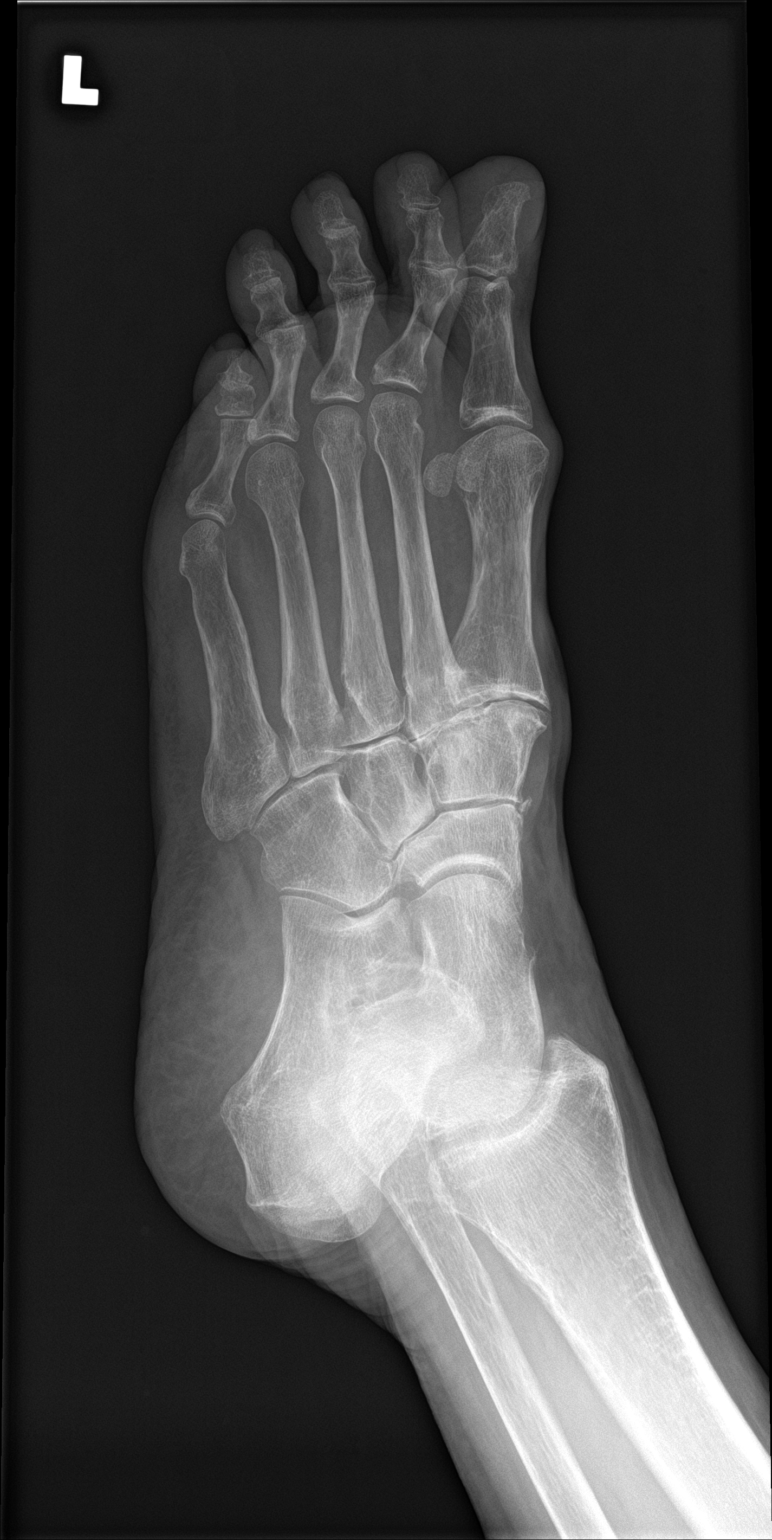

[foot lat]
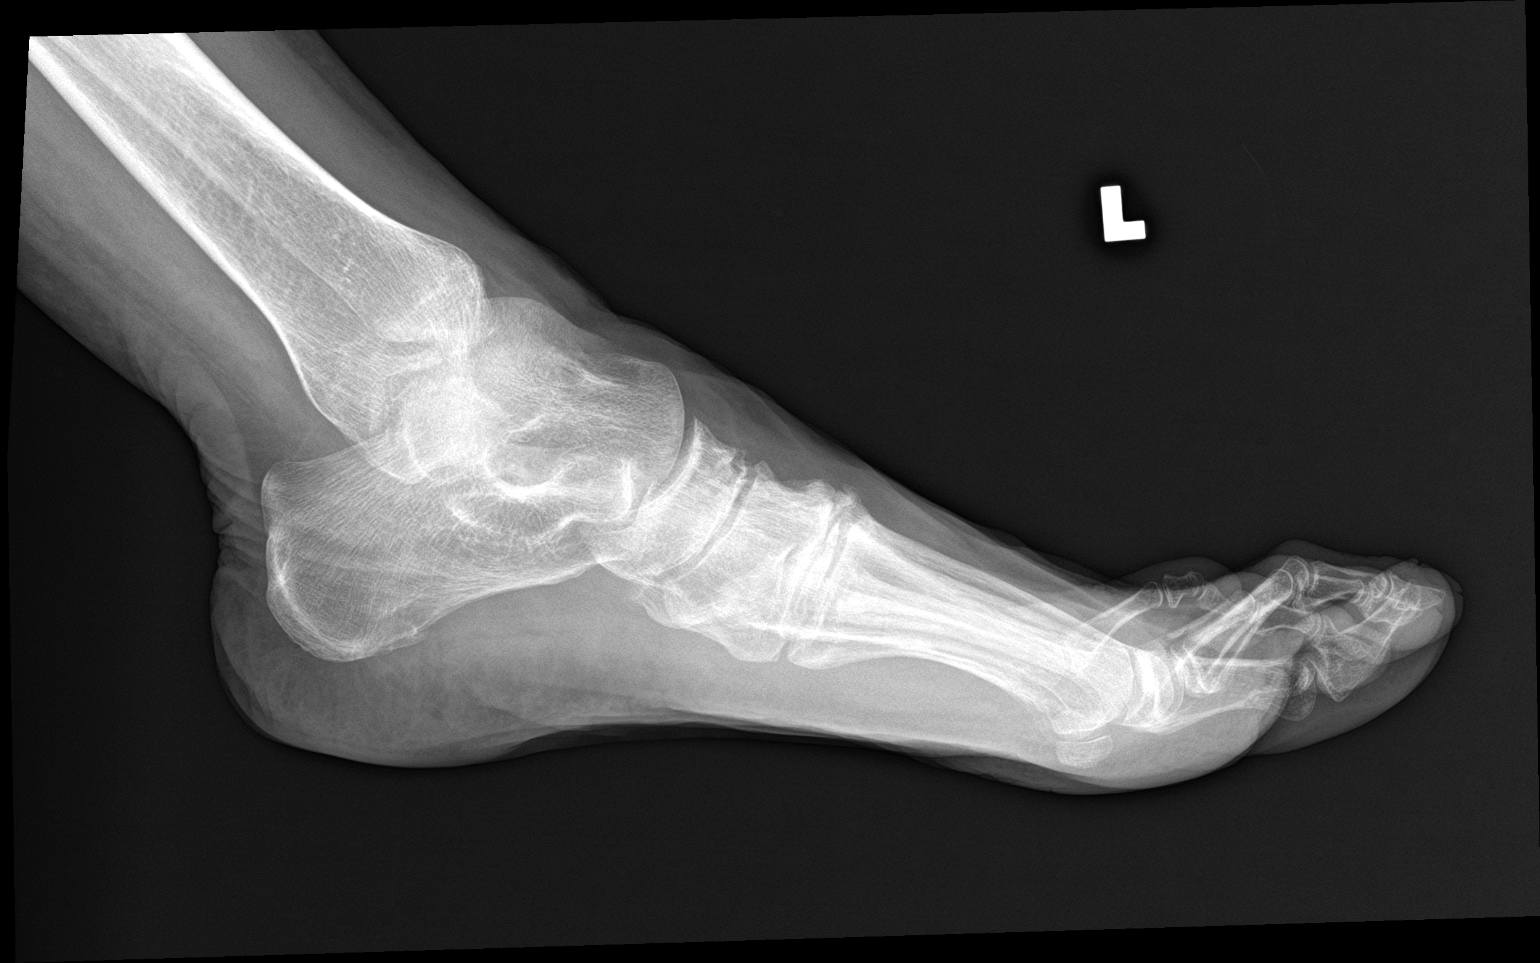

[3 of 3 positions shown; findings below may reference images not displayed]

FINDINGS: Severe second, moderate first and mild third greater than fourth and
fifth tarsometatarsal joint space narrowing. Subchondral sclerosis
and peripheral spurring of the first tarsometatarsal joint. Mildly
decreased bone mineralization. Tiny plantar calcaneal heel spur.
Mild dorsal midfoot degenerative osteophytes. No acute fracture is
seen. No dislocation.
IMPRESSION: :
IMPRESSION: 1. No acute fracture is seen.
2. Osteoarthritis of the tarsometatarsal joints, severe at the
second tarsometatarsal joint and moderate to severe at the first
tarsometatarsal joint, mildly worsened from prior.

## 2022-04-02 NOTE — Progress Notes (Signed)
? ? ?  Procedures performed today:   ? ?Procedure: Real-time Ultrasound Guided injection of the left knee ?Device: Samsung HS60  ?Verbal informed consent obtained.  ?Time-out conducted.  ?Noted no overlying erythema, induration, or other signs of local infection.  ?Skin prepped in a sterile fashion.  ?Local anesthesia: Topical Ethyl chloride.  ?With sterile technique and under real time ultrasound guidance: Noted trace effusion, 30 mg/2 mL of OrthoVisc (sodium hyaluronate) in a prefilled syringe was injected easily into the knee through a 22-gauge needle. ?Completed without difficulty  ?Advised to call if fevers/chills, erythema, induration, drainage, or persistent bleeding.  ?Images permanently stored and available for review in PACS.  ?Impression: Technically successful ultrasound guided injection. ? ?Independent interpretation of notes and tests performed by another provider:  ? ?None. ? ?Brief History, Exam, Impression, and Recommendations:   ? ?Primary osteoarthritis of left knee ?Orthovisc No. 3 of 4 left knee, return in 1 week for #4 of 4, feeling okay. ? ?Left foot pain ?Donna Ramsey is having recurrence of left foot pain, back in 2022 we injected her left fourth metatarsophalangeal joint and she did little better, she still had some pain on the metatarsal shaft that improved with a metatarsal pad. ?Unfortunately having recurrence of discomfort, tenderness proximally on the fourth metatarsal shaft, adding x-rays, she will go back into her postop shoe. ?She can wear a tennis shoe when she goes for longer walks but she should cut her mileage in half. ?If insufficient improvement in approximately 6 weeks we will consider MRI. ? ? ? ?___________________________________________ ?Gwen Her. Dianah Field, M.D., ABFM., CAQSM. ?Primary Care and Sports Medicine ?[place] ? ?Adjunct Instructor of Family Medicine  ?[employer] ?

## 2022-04-02 NOTE — Progress Notes (Signed)
89

## 2022-04-02 NOTE — Assessment & Plan Note (Signed)
Donna Ramsey is having recurrence of left foot pain, back in 2022 we injected Donna Ramsey left fourth metatarsophalangeal joint and she did little better, she still had some pain on the metatarsal shaft that improved with a metatarsal pad. ?Unfortunately having recurrence of discomfort, tenderness proximally on the fourth metatarsal shaft, adding x-rays, she will go back into Donna Ramsey postop shoe. ?She can wear a tennis shoe when she goes for longer walks but she should cut Donna Ramsey mileage in half. ?If insufficient improvement in approximately 6 weeks we will consider MRI. ?

## 2022-04-02 NOTE — Assessment & Plan Note (Signed)
Orthovisc No. 3 of 4 left knee, return in 1 week for #4 of 4, feeling okay. ?

## 2022-04-03 DIAGNOSIS — D472 Monoclonal gammopathy: Secondary | ICD-10-CM | POA: Diagnosis not present

## 2022-04-09 ENCOUNTER — Ambulatory Visit (INDEPENDENT_AMBULATORY_CARE_PROVIDER_SITE_OTHER): Payer: Medicare Other

## 2022-04-09 ENCOUNTER — Other Ambulatory Visit: Payer: Self-pay | Admitting: Sports Medicine

## 2022-04-09 ENCOUNTER — Ambulatory Visit (INDEPENDENT_AMBULATORY_CARE_PROVIDER_SITE_OTHER): Payer: Medicare Other | Admitting: Sports Medicine

## 2022-04-09 DIAGNOSIS — M1712 Unilateral primary osteoarthritis, left knee: Secondary | ICD-10-CM

## 2022-04-09 MED ORDER — PREDNISONE 50 MG PO TABS: ORAL_TABLET | ORAL | 2 refills | Status: DC

## 2022-04-09 NOTE — Assessment & Plan Note (Signed)
Orthovisc 4 of 4 left knee, return as needed. 

## 2022-04-09 NOTE — Progress Notes (Signed)
? ? ?  Procedures performed today:   ? ?Procedure: Real-time Ultrasound Guided injection of the left knee ?Device: Samsung HS60  ?Verbal informed consent obtained.  ?Time-out conducted.  ?Noted no overlying erythema, induration, or other signs of local infection.  ?Skin prepped in a sterile fashion.  ?Local anesthesia: Topical Ethyl chloride.  ?With sterile technique and under real time ultrasound guidance: Noted trace effusion, 30 mg/2 mL of OrthoVisc (sodium hyaluronate) in a prefilled syringe was injected easily into the knee through a 22-gauge needle. ?Completed without difficulty  ?Advised to call if fevers/chills, erythema, induration, drainage, or persistent bleeding.  ?Images permanently stored and available for review in PACS.  ?Impression: Technically successful ultrasound guided injection. ? ?Independent interpretation of notes and tests performed by another provider:  ? ?None. ? ?Brief History, Exam, Impression, and Recommendations:   ? ?Primary osteoarthritis of left knee ?Orthovisc 4 of 4 left knee, return as needed. ? ? ? ?___________________________________________ ?Gwen Her. Dianah Field, M.D., ABFM., CAQSM. ?Primary Care and Sports Medicine ?[place] ? ?Adjunct Instructor of Family Medicine  ?[employer] ?

## 2022-04-10 DIAGNOSIS — D704 Cyclic neutropenia: Secondary | ICD-10-CM | POA: Diagnosis not present

## 2022-04-10 DIAGNOSIS — D472 Monoclonal gammopathy: Secondary | ICD-10-CM | POA: Diagnosis not present

## 2022-04-10 DIAGNOSIS — K766 Portal hypertension: Secondary | ICD-10-CM | POA: Diagnosis not present

## 2022-04-17 ENCOUNTER — Other Ambulatory Visit: Payer: Self-pay | Admitting: Surgery

## 2022-04-17 DIAGNOSIS — Z1231 Encounter for screening mammogram for malignant neoplasm of breast: Secondary | ICD-10-CM

## 2022-04-24 DIAGNOSIS — K766 Portal hypertension: Secondary | ICD-10-CM | POA: Diagnosis not present

## 2022-04-24 DIAGNOSIS — K7401 Hepatic fibrosis, early fibrosis: Secondary | ICD-10-CM | POA: Diagnosis not present

## 2022-04-24 DIAGNOSIS — K7400 Hepatic fibrosis, unspecified: Secondary | ICD-10-CM | POA: Diagnosis not present

## 2022-06-08 DIAGNOSIS — N644 Mastodynia: Secondary | ICD-10-CM | POA: Diagnosis not present

## 2022-06-08 DIAGNOSIS — N6012 Diffuse cystic mastopathy of left breast: Secondary | ICD-10-CM | POA: Diagnosis not present

## 2022-06-08 DIAGNOSIS — J4520 Mild intermittent asthma, uncomplicated: Secondary | ICD-10-CM | POA: Insufficient documentation

## 2022-06-08 DIAGNOSIS — N6011 Diffuse cystic mastopathy of right breast: Secondary | ICD-10-CM | POA: Diagnosis not present

## 2022-07-02 ENCOUNTER — Ambulatory Visit: Payer: Medicare Other

## 2022-07-19 ENCOUNTER — Ambulatory Visit
Admission: RE | Admit: 2022-07-19 | Discharge: 2022-07-19 | Disposition: A | Discharge status: Home / Self Care | Payer: Medicare Other | Source: Ambulatory Visit | Attending: Surgery | Admitting: Surgery

## 2022-07-19 DIAGNOSIS — Z1231 Encounter for screening mammogram for malignant neoplasm of breast: Secondary | ICD-10-CM | POA: Diagnosis not present

## 2022-07-31 DIAGNOSIS — H2513 Age-related nuclear cataract, bilateral: Secondary | ICD-10-CM | POA: Diagnosis not present

## 2022-07-31 DIAGNOSIS — H35373 Puckering of macula, bilateral: Secondary | ICD-10-CM | POA: Diagnosis not present

## 2022-07-31 DIAGNOSIS — H35342 Macular cyst, hole, or pseudohole, left eye: Secondary | ICD-10-CM | POA: Diagnosis not present

## 2022-08-01 ENCOUNTER — Ambulatory Visit
Admission: EM | Admit: 2022-08-01 | Discharge: 2022-08-01 | Disposition: A | Discharge status: Home / Self Care | Payer: Medicare Other | Attending: Family Medicine | Admitting: Family Medicine

## 2022-08-01 ENCOUNTER — Encounter: Payer: Self-pay | Admitting: Emergency Medicine

## 2022-08-01 DIAGNOSIS — S81811A Laceration without foreign body, right lower leg, initial encounter: Secondary | ICD-10-CM | POA: Diagnosis not present

## 2022-08-01 NOTE — ED Notes (Signed)
Last documented Tdap was in 2012. Pt states she thinks she had one more recently after an injury on her property at another Urgent Care. Tdap refused at this time. Provider updated.

## 2022-08-01 NOTE — ED Triage Notes (Signed)
30 min pta pt cut her RLE on a drawer pta  Pressure  dressing applied at home Not cleaned at home

## 2022-08-01 NOTE — ED Provider Notes (Signed)
[name]    CSN: [id number] Arrival date & time: 08/01/22  1834      History   Chief Complaint Chief Complaint  Patient presents with   Extremity Laceration    Skin tear -RLE     HPI Donna EGAN SAHLIN is a 71 y.o. female.   HPI 71 year old female presents with right lower extremity skin tear/laceration that occurred roughly 30 minutes ago.  Patient reports inadvertently walking into a drawer that was left open in her kitchen.  PMH significant for portal hypertension, lumbar spondylosis, and s/p splenectomy.  Patient reports last Tdap was given in 2012, patient declined Tdap tonight.  Past Medical History:  Diagnosis Date   Asplenia    Family history of breast cancer    GERD (gastroesophageal reflux disease)    Liver disease, unspecified    heterogenous echotexture   Portal hypertension ([place])    Splenic infarct     Patient Active Problem List   Diagnosis Date Noted   Spontaneous rupture of extensor tendon of left foot 06/05/2021   Elevated LDL cholesterol level 05/09/2021   Elevated random blood glucose level 05/09/2021   Asplenia 03/16/2021   OAB (overactive bladder) 02/22/2021   History of splenectomy 06/27/2020   Pre-operative clearance 06/27/2020   Baker's cyst of knee, left 05/30/2020   Primary osteoarthritis of left knee 05/23/2020   Left foot pain 04/11/2020   Lumbar spondylosis 09/28/2019   Genetic testing 07/06/2019   Family history of breast cancer    Primary osteoarthritis of left hip 05/13/2019   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   Osteopenia 05/13/2019   Peroneal tendinitis, left 02/16/2019   Subluxation of left extensor carpi ulnaris tendon 02/02/2019   Fracture of fibula, left, closed 12/24/2018    Past Surgical History:  Procedure Laterality Date   ABDOMINAL HYSTERECTOMY     BREAST BIOPSY Bilateral 50 yrs ago   benign   CHONDROPLASTY Left 07/28/2020   Procedure: CHONDROPLASTY LEFT KNEE;  Surgeon:  Hiram Gash, MD;  Location: [facility];  Service: Orthopedics;  Laterality: Left;   HERNIA REPAIR     INGUINAL HERNIA REPAIR Right 05/10/2021   Procedure: RIGHT INGUINAL HERNIA REPAIR WITH MESH;  Surgeon: Coralie Keens, MD;  Location: [facility];  Service: General;  Laterality: Right;   KNEE ARTHROSCOPY WITH LATERAL MENISECTOMY Left 07/28/2020   Procedure: LEFT KNEE ARTHROSCOPY WITH LATERAL MENISECTOMY;  Surgeon: Hiram Gash, MD;  Location: [facility];  Service: Orthopedics;  Laterality: Left;   KNEE ARTHROSCOPY WITH MEDIAL MENISECTOMY Left 07/28/2020   Procedure: LEFT KNEE ARTHROSCOPY WITH MEDIAL MENISECTOMY;  Surgeon: Hiram Gash, MD;  Location: [facility];  Service: Orthopedics;  Laterality: Left;   SPLENECTOMY, TOTAL      OB History   No obstetric history on file.      Home Medications    Prior to Admission medications   Medication Sig Start Date End Date Taking? Authorizing Provider  ALPRAZolam (XANAX) 0.25 MG tablet Take 0.25 mg by mouth 2 (two) times daily as needed for anxiety. 05/03/20   [provider]  cetirizine (ZYRTEC) 10 MG tablet Take 10 mg by mouth daily as needed for allergies. Patient not taking: Reported on 08/01/2022    [provider]  Hyoscyamine Sulfate SL 0.125 MG SUBL Take 0.125 mg by mouth daily as needed (GERD). Patient not taking: Reported on 08/01/2022 04/10/18   [provider]  ibuprofen (ADVIL) 200 MG tablet Take 200  mg by mouth every 6 (six) hours as needed. Patient not taking: Reported on 08/01/2022    [provider]  NON FORMULARY Take 2 capsules by mouth daily. Citrus bioflavanoids    [provider]  Omega 3 1000 MG CAPS Take 2 capsules by mouth daily.    [provider]  predniSONE (DELTASONE) 50 MG tablet One tab PO daily for 5 days. Patient not taking: Reported on 08/01/2022 04/09/22   Silverio Decamp, MD  Probiotic Product (PRO-BIOTIC BLEND  PO) Take 1 capsule by mouth daily. Patient not taking: Reported on 08/01/2022    [provider]  triamcinolone cream (KENALOG) 0.1 % Apply 1 application topically daily as needed (eczema). 12/08/21   Luetta Nutting, DO    Family History Family History  Problem Relation Age of Onset   Breast cancer Mother 24   Stroke Mother    Hypertension Father    Heart attack Father    Diabetes Sister    Breast cancer Paternal Aunt    Breast cancer Paternal Grandmother        dx 19s   Rheum arthritis Daughter     Social History Social History   Tobacco Use   Smoking status: Never   Smokeless tobacco: Never  Vaping Use   Vaping Use: Never used  Substance Use Topics   Alcohol use: Yes    Alcohol/week: 7.0 standard drinks of alcohol    Types: 7 Glasses of wine per week   Drug use: Never     Allergies   Clindamycin, Codeine, Pentazocine, Sulfamethoxazole-trimethoprim, Doxycycline, Levofloxacin, Neomycin, Quinolones, and Sulfa antibiotics   Review of Systems Review of Systems  Skin:  Positive for wound.     Physical Exam Triage Vital Signs ED Triage Vitals  Enc Vitals Group     BP 08/01/22 1847 138/88     Pulse Rate 08/01/22 1847 69     Resp 08/01/22 1847 16     Temp 08/01/22 1847 98.9 F (37.2 C)     Temp Source 08/01/22 1847 Oral     SpO2 08/01/22 1847 96 %     Weight 08/01/22 1850 140 lb (63.5 kg)     Height 08/01/22 1850 5' 9.5" (1.765 m)     Head Circumference --      Peak Flow --      Pain Score 08/01/22 1848 2     Pain Loc --      Pain Edu? --      Excl. in [place]? --    No data found.  Updated Vital Signs BP 138/88 (BP Location: Left Arm)   Pulse 69   Temp 98.9 F (37.2 C) (Oral)   Resp 16   Ht 5' 9.5" (1.765 m)   Wt 140 lb (63.5 kg)   SpO2 96%   BMI 20.38 kg/m    Physical Exam Vitals and nursing note reviewed.  Constitutional:      General: She is not in acute distress.    Appearance: Normal appearance. She is normal weight. She is not  ill-appearing.  HENT:     Head: Normocephalic.     Mouth/Throat:     Mouth: Mucous membranes are moist.     Pharynx: Oropharynx is clear.  Eyes:     Extraocular Movements: Extraocular movements intact.     Conjunctiva/sclera: Conjunctivae normal.     Pupils: Pupils are equal, round, and reactive to light.  Cardiovascular:     Rate and Rhythm: Normal rate and regular rhythm.  Pulses: Normal pulses.     Heart sounds: Normal heart sounds. No murmur heard. Pulmonary:     Effort: Pulmonary effort is normal.     Breath sounds: Normal breath sounds. No wheezing, rhonchi or rales.  Musculoskeletal:        General: Normal range of motion.     Cervical back: Normal range of motion and neck supple.  Skin:    General: Skin is warm and dry.     Comments: Right lower leg (dorsum): 4 cm x 2.5 cm L-shaped skin avulsion noted; 2.0 in x 0.5 in Steri-Strips (#6) use to approximate skin edges, Steri-Strips anchored with benzoin tincture; [place] nonadherent Telfa with Coban compression dressing  Neurological:     General: No focal deficit present.     Mental Status: She is alert and oriented to person, place, and time.      UC Treatments / Results  Labs (all labs ordered are listed, but only abnormal results are displayed) Labs Reviewed - No data to display  EKG   Radiology No results found.  Procedures Procedures (including critical care time)  Medications Ordered in UC Medications - No data to display  Initial Impression / Assessment and Plan / UC Course  I have reviewed the triage vital signs and the nursing notes.  Pertinent labs & imaging results that were available during my care of the patient were reviewed by me and considered in my medical decision making (see chart for details).     MDM: 1. Laceration of right lower extremity, initial encounter-laceration repaired with Steri-Strips/ benzoin tincture this evening. Advised patient to keep wound area dry and clean for the  next 72 hours, advised may change outer dressing; however, please leave Steri-Strips in-place/intact, they will come off on their own.  Advised patient if signs or symptoms of infection evolve please follow-up with PCP or here for further evaluation.  Patient discharged home, hemodynamically stable.  Final Clinical Impressions(s) / UC Diagnoses   Final diagnoses:  Laceration of right lower extremity, initial encounter     Discharge Instructions      Advised patient to keep wound area dry and clean for the next 72 hours, advised may change outer dressing; however, please leave Steri-Strips in-place/intact, they will come off on their own.  Advised patient if signs or symptoms of infection evolve please follow-up with PCP or here for further evaluation.     ED Prescriptions   None    PDMP not reviewed this encounter.   Eliezer Lofts, [place] 08/01/22 1951

## 2022-08-01 NOTE — Discharge Instructions (Addendum)
Advised patient to keep wound area dry and clean for the next 72 hours, advised may change outer dressing; however, please leave Steri-Strips in-place/intact, they will come off on their own.  Advised patient if signs or symptoms of infection evolve please follow-up with PCP or here for further evaluation.

## 2022-08-08 ENCOUNTER — Encounter: Payer: Self-pay | Admitting: General Practice

## 2022-08-08 DIAGNOSIS — H524 Presbyopia: Secondary | ICD-10-CM | POA: Diagnosis not present

## 2022-08-08 DIAGNOSIS — H35342 Macular cyst, hole, or pseudohole, left eye: Secondary | ICD-10-CM | POA: Diagnosis not present

## 2022-08-08 DIAGNOSIS — Z83511 Family history of glaucoma: Secondary | ICD-10-CM | POA: Diagnosis not present

## 2022-08-08 DIAGNOSIS — H5203 Hypermetropia, bilateral: Secondary | ICD-10-CM | POA: Diagnosis not present

## 2022-08-08 DIAGNOSIS — H2513 Age-related nuclear cataract, bilateral: Secondary | ICD-10-CM | POA: Diagnosis not present

## 2022-08-08 DIAGNOSIS — H0288B Meibomian gland dysfunction left eye, upper and lower eyelids: Secondary | ICD-10-CM | POA: Diagnosis not present

## 2022-08-08 DIAGNOSIS — H11123 Conjunctival concretions, bilateral: Secondary | ICD-10-CM | POA: Diagnosis not present

## 2022-08-08 DIAGNOSIS — H35373 Puckering of macula, bilateral: Secondary | ICD-10-CM | POA: Diagnosis not present

## 2022-08-08 DIAGNOSIS — H43813 Vitreous degeneration, bilateral: Secondary | ICD-10-CM | POA: Diagnosis not present

## 2022-08-08 DIAGNOSIS — H52203 Unspecified astigmatism, bilateral: Secondary | ICD-10-CM | POA: Diagnosis not present

## 2022-08-08 DIAGNOSIS — H0288A Meibomian gland dysfunction right eye, upper and lower eyelids: Secondary | ICD-10-CM | POA: Diagnosis not present

## 2022-10-09 DIAGNOSIS — E789 Disorder of lipoprotein metabolism, unspecified: Secondary | ICD-10-CM | POA: Diagnosis not present

## 2022-10-09 DIAGNOSIS — K769 Liver disease, unspecified: Secondary | ICD-10-CM | POA: Diagnosis not present

## 2022-10-09 DIAGNOSIS — K739 Chronic hepatitis, unspecified: Secondary | ICD-10-CM | POA: Diagnosis not present

## 2022-10-09 DIAGNOSIS — K766 Portal hypertension: Secondary | ICD-10-CM | POA: Diagnosis not present

## 2022-10-15 DIAGNOSIS — K766 Portal hypertension: Secondary | ICD-10-CM | POA: Diagnosis not present

## 2022-10-19 ENCOUNTER — Other Ambulatory Visit: Payer: Self-pay | Admitting: Family Medicine

## 2022-10-19 ENCOUNTER — Ambulatory Visit (INDEPENDENT_AMBULATORY_CARE_PROVIDER_SITE_OTHER): Payer: Medicare Other | Admitting: Family Medicine

## 2022-10-19 VITALS — BP 121/73 | HR 55 | Ht 70.0 in | Wt 146.1 lb

## 2022-10-19 DIAGNOSIS — Z0000 Encounter for general adult medical examination without abnormal findings: Secondary | ICD-10-CM | POA: Diagnosis not present

## 2022-10-19 DIAGNOSIS — Z780 Asymptomatic menopausal state: Secondary | ICD-10-CM | POA: Diagnosis not present

## 2022-10-19 MED ORDER — TRIAMCINOLONE ACETONIDE 0.1 % EX CREA: 1.0000 | TOPICAL_CREAM | Freq: Every day | CUTANEOUS | 3 refills | Status: DC | PRN

## 2022-10-19 NOTE — Patient Instructions (Addendum)
[place] Maintenance Summary and Written Plan of Care  Ms. Crabtree ,  Thank you for allowing me to perform your Medicare Annual Wellness Visit and for your ongoing commitment to your health.   Health Maintenance & Immunization History Health Maintenance  Topic Date Due  . Meningococcal B Vaccine (1 of 4 - Increased Risk) 10/20/2022 (Originally 09/15/1961)  . COVID-19 Vaccine (4 - Pfizer risk series) 11/04/2022 (Originally 10/10/2020)  . INFLUENZA VACCINE  03/17/2023 (Originally 07/17/2022)  . Pneumonia Vaccine 52+ Years old (3 - PPSV23 or PCV20) 10/20/2023 (Originally 09/14/2018)  . DEXA SCAN  10/20/2023 (Originally 09/14/2018)  . TETANUS/TDAP  10/20/2023 (Originally 03/29/2021)  . COLONOSCOPY (Pts 45-34yr Insurance coverage will need to be confirmed)  04/12/2023  . Medicare Annual Wellness (AWV)  10/20/2023  . MAMMOGRAM  07/19/2024  . Hepatitis C Screening  Completed  . Zoster Vaccines- Shingrix  Completed  . HPV VACCINES  Aged Out   Immunization History  Administered Date(s) Administered  . H1N1 11/19/2008  . HIB (PRP-T) 02/03/2009  . Influenza, High Dose Seasonal PF 09/16/2018, 09/16/2018, 08/25/2019, 08/25/2019  . Influenza-Unspecified 09/15/2014, 10/08/2015, 08/21/2016, 09/24/2017, 08/25/2019  . PFIZER(Purple Top)SARS-COV-2 Vaccination 01/14/2020, 02/11/2020, 08/15/2020  . Pneumococcal Conjugate-13 01/15/2014  . Pneumococcal Polysaccharide-23 08/29/2006, 09/21/2011  . Td 09/08/2008  . Tdap 03/30/2011  . Zoster Recombinat (Shingrix) 02/28/2018, 02/28/2018, 08/12/2018, 08/12/2018    These are the patient goals that we discussed:  Goals Addressed              This Visit's Progress   .  Patient Stated (pt-stated)        Patient would like to increase her muscle mass.         This is a list of Health Maintenance Items that are overdue or due now: Pneumococcal vaccine  Influenza vaccine Td vaccine Bone densitometry  screening Meningococcal B Vaccine    Orders/Referrals Placed Today: Orders Placed This Encounter  Procedures  . DEXAScan    Standing Status:   Future    Standing Expiration Date:   10/20/2023    Scheduling Instructions:     Please call patient to schedule.    Order Specific Question:   Reason for exam:    Answer:   Post menopausal    Order Specific Question:   Preferred imaging location?    Answer:   [facility]  (Contact our referral department at [phone] you have not spoken with someone about your referral appointment within the next 5 days)    Follow-up Plan Follow-up with MLuetta Nutting DO as planned Schedule your, influenza, pneumonia, td vaccine at your pharmacy.  Discuss meningococcal b vaccine with PCP.  Medicare wellness visit in one year. AVS printed and given to the patient.   Health Maintenance, Female Adopting a healthy lifestyle and getting preventive care are important in promoting health and wellness. Ask your health care provider about: The right schedule for you to have regular tests and exams. Things you can do on your own to prevent diseases and keep yourself healthy. What should I know about diet, weight, and exercise? Eat a healthy diet  Eat a diet that includes plenty of vegetables, fruits, low-fat dairy products, and lean protein. Do not eat a lot of foods that are high in solid fats, added sugars, or sodium. Maintain a healthy weight Body mass index (BMI) is used to identify weight problems. It estimates body fat based on height and weight. Your health care provider can help determine your  BMI and help you achieve or maintain a healthy weight. Get regular exercise Get regular exercise. This is one of the most important things you can do for your health. Most adults should: Exercise for at least 150 minutes each week. The exercise should increase your heart rate and make you sweat (moderate-intensity exercise). Do strengthening  exercises at least twice a week. This is in addition to the moderate-intensity exercise. Spend less time sitting. Even light physical activity can be beneficial. Watch cholesterol and blood lipids Have your blood tested for lipids and cholesterol at 69 years of age, then have this test every 5 years. Have your cholesterol levels checked more often if: Your lipid or cholesterol levels are high. You are older than 69 years of age. You are at high risk for heart disease. What should I know about cancer screening? Depending on your health history and family history, you may need to have cancer screening at various ages. This may include screening for: Breast cancer. Cervical cancer. Colorectal cancer. Skin cancer. Lung cancer. What should I know about heart disease, diabetes, and high blood pressure? Blood pressure and heart disease High blood pressure causes heart disease and increases the risk of stroke. This is more likely to develop in people who have high blood pressure readings or are overweight. Have your blood pressure checked: Every 3-5 years if you are 69-53 years of age. Every year if you are 46 years old or older. Diabetes Have regular diabetes screenings. This checks your fasting blood sugar level. Have the screening done: Once every three years after age 48 if you are at a normal weight and have a low risk for diabetes. More often and at a younger age if you are overweight or have a high risk for diabetes. What should I know about preventing infection? Hepatitis B If you have a higher risk for hepatitis B, you should be screened for this virus. Talk with your health care provider to find out if you are at risk for hepatitis B infection. Hepatitis C Testing is recommended for: Everyone born from [birth year] through 1963. Anyone with known risk factors for hepatitis C. Sexually transmitted infections (STIs) Get screened for STIs, including gonorrhea and chlamydia, if: You are  sexually active and are younger than 69 years of age. You are older than 69 years of age and your health care provider tells you that you are at risk for this type of infection. Your sexual activity has changed since you were last screened, and you are at increased risk for chlamydia or gonorrhea. Ask your health care provider if you are at risk. Ask your health care provider about whether you are at high risk for HIV. Your health care provider may recommend a prescription medicine to help prevent HIV infection. If you choose to take medicine to prevent HIV, you should first get tested for HIV. You should then be tested every 3 months for as long as you are taking the medicine. Pregnancy If you are about to stop having your period (premenopausal) and you may become pregnant, seek counseling before you get pregnant. Take 400 to 800 micrograms (mcg) of folic acid every day if you become pregnant. Ask for birth control (contraception) if you want to prevent pregnancy. Osteoporosis and menopause Osteoporosis is a disease in which the bones lose minerals and strength with aging. This can result in bone fractures. If you are 66 years old or older, or if you are at risk for osteoporosis and fractures, ask your  health care provider if you should: Be screened for bone loss. Take a calcium or vitamin D supplement to lower your risk of fractures. Be given hormone replacement therapy (HRT) to treat symptoms of menopause. Follow these instructions at home: Alcohol use Do not drink alcohol if: Your health care provider tells you not to drink. You are pregnant, may be pregnant, or are planning to become pregnant. If you drink alcohol: Limit how much you have to: 0-1 drink a day. Know how much alcohol is in your drink. In the [place] one drink equals one 12 oz bottle of beer (355 mL), one 5 oz glass of wine (148 mL), or one 1 oz glass of hard liquor (44 mL). Lifestyle Do not use any products that contain  nicotine or tobacco. These products include cigarettes, chewing tobacco, and vaping devices, such as e-cigarettes. If you need help quitting, ask your health care provider. Do not use street drugs. Do not share needles. Ask your health care provider for help if you need support or information about quitting drugs. General instructions Schedule regular health, dental, and eye exams. Stay current with your vaccines. Tell your health care provider if: You often feel depressed. You have ever been abused or do not feel safe at home. Summary Adopting a healthy lifestyle and getting preventive care are important in promoting health and wellness. Follow your health care provider's instructions about healthy diet, exercising, and getting tested or screened for diseases. Follow your health care provider's instructions on monitoring your cholesterol and blood pressure. This information is not intended to replace advice given to you by your health care provider. Make sure you discuss any questions you have with your health care provider. Document Revised: 04/24/2021 Document Reviewed: 04/24/2021 Elsevier Patient Education  [place].

## 2022-10-19 NOTE — Progress Notes (Signed)
MEDICARE ANNUAL WELLNESS VISIT  10/19/2022  Subjective:  Donna Ramsey is a 71 y.o. female patient of Luetta Nutting, DO who had a Medicare Annual Wellness Visit today. Donna Ramsey is Retired and lives alone. Donna Ramsey has 3 children. Donna Ramsey reports that Donna Ramsey is socially active and does interact with friends/family regularly. Donna Ramsey is moderately physically active and enjoys read, cook, yardwork and walk.  Patient Care Team: Luetta Nutting, DO as PCP - General (Family Medicine)     10/19/2022    8:20 AM 05/10/2021    9:41 AM 05/08/2021    9:11 AM 05/05/2021    9:21 AM 07/28/2020    7:01 AM 06/27/2020   11:35 AM 08/30/2019    9:41 AM  Advanced Directives  Does Patient Have a Medical Advance Directive? Yes Yes Yes Yes No No No  Type of Advance Directive Living will Healthcare Power of [place]     Does patient want to make changes to medical advance directive? No - Patient declined No - Patient declined No - Patient declined      Copy of [place] in Chart?  Yes - validated most recent copy scanned in chart (See row information) Yes - validated most recent copy scanned in chart (See row information)      Would patient like information on creating a medical advance directive?     No - Patient declined Yes (ED - Information included in AVS)     Hospital Utilization Over the Past 12 Months: # of hospitalizations or ER visits: 0 # of surgeries: 0  Review of Systems    Patient reports that her overall health is better when compared to last year.  Review of Systems: History obtained from chart review and the patient  All other systems negative.  Pain Assessment Pain : No/denies pain     Current Medications & Allergies (verified) Allergies as of 10/19/2022       Reactions   Clindamycin Hives   Codeine Hives, Rash   Pentazocine Hives   Sulfamethoxazole-trimethoprim    Other reaction(s): Rash with itching (itching rash,  pruritus) 2017 Reaction: rash, itching 03/16/2021: faint rash and itching 30 min after bactrim 400/80 oral challenge.    Doxycycline Other (See Comments)   Itching without rash near end of course Itching without rash near end of course   Levofloxacin Rash, Other (See Comments)   tendonitis   Neomycin Itching   Quinolones Other (See Comments)   Achilles Pain   Sulfa Antibiotics Rash        Medication List        Accurate as of October 19, 2022  8:42 AM. If you have any questions, ask your nurse or doctor.          ALPRAZolam 0.25 MG tablet Commonly known as: XANAX Take 0.25 mg by mouth 2 (two) times daily as needed for anxiety.   cetirizine 10 MG tablet Commonly known as: ZYRTEC Take 10 mg by mouth daily as needed for allergies.   Hyoscyamine Sulfate SL 0.125 MG Subl Take 0.125 mg by mouth daily as needed (GERD).   ibuprofen 200 MG tablet Commonly known as: ADVIL Take 200 mg by mouth every 6 (six) hours as needed.   NON FORMULARY Take 2 capsules by mouth daily. Citrus bioflavanoids   Omega 3 1000 MG Caps Take 2 capsules by mouth daily.   predniSONE 50 MG tablet Commonly known as: DELTASONE One tab PO daily for 5  days.   PRO-BIOTIC BLEND PO Take 1 capsule by mouth daily.   triamcinolone cream 0.1 % Commonly known as: KENALOG Apply 1 application topically daily as needed (eczema).        History (reviewed): Past Medical History:  Diagnosis Date   Asplenia    Family history of breast cancer    GERD (gastroesophageal reflux disease)    Liver disease, unspecified    heterogenous echotexture   Portal hypertension ([place])    Splenic infarct    Past Surgical History:  Procedure Laterality Date   ABDOMINAL HYSTERECTOMY     BREAST BIOPSY Bilateral 50 yrs ago   benign   CHONDROPLASTY Left 07/28/2020   Procedure: CHONDROPLASTY LEFT KNEE;  Surgeon: Hiram Gash, MD;  Location: [place];  Service: Orthopedics;  Laterality: Left;   HERNIA  REPAIR     INGUINAL HERNIA REPAIR Right 05/10/2021   Procedure: RIGHT INGUINAL HERNIA REPAIR WITH MESH;  Surgeon: Coralie Keens, MD;  Location: [facility];  Service: General;  Laterality: Right;   KNEE ARTHROSCOPY WITH LATERAL MENISECTOMY Left 07/28/2020   Procedure: LEFT KNEE ARTHROSCOPY WITH LATERAL MENISECTOMY;  Surgeon: Hiram Gash, MD;  Location: [place];  Service: Orthopedics;  Laterality: Left;   KNEE ARTHROSCOPY WITH MEDIAL MENISECTOMY Left 07/28/2020   Procedure: LEFT KNEE ARTHROSCOPY WITH MEDIAL MENISECTOMY;  Surgeon: Hiram Gash, MD;  Location: [place];  Service: Orthopedics;  Laterality: Left;   SPLENECTOMY, TOTAL     Family History  Problem Relation Age of Onset   Breast cancer Mother 47   Stroke Mother    Hypertension Father    Heart attack Father    Diabetes Sister    Breast cancer Paternal Aunt    Breast cancer Paternal Grandmother        dx 58s   Rheum arthritis Daughter    Social History   Socioeconomic History   Marital status: Widowed    Spouse name: Not on file   Number of children: 3   Years of education: 16   Highest education level: Bachelor's degree (e.g., BA, AB, BS)  Occupational History    Comment: Retired Software engineer  Tobacco Use   Smoking status: Never   Smokeless tobacco: Never  Vaping Use   Vaping Use: Never used  Substance and Sexual Activity   Alcohol use: Yes    Alcohol/week: 7.0 standard drinks of alcohol    Types: 7 Glasses of wine per week   Drug use: Never   Sexual activity: Never  Other Topics Concern   Not on file  Social History Narrative   Lives alone but next to her daughter. Donna Ramsey is walking 3-5 miles per day. Donna Ramsey likes to read, cook and do yard work.   Social Determinants of Health   Financial Resource Strain: Low Risk  (10/19/2022)   Overall Financial Resource Strain (CARDIA)    Difficulty of Paying Living Expenses: Not hard at all  Food Insecurity: No Food Insecurity (10/19/2022)    Hunger Vital Sign    Worried About Running Out of Food in the Last Year: Never true    Ran Out of Food in the Last Year: Never true  Transportation Needs: No Transportation Needs (10/19/2022)   PRAPARE - Hydrologist (Medical): No    Lack of Transportation (Non-Medical): No  Physical Activity: Sufficiently Active (10/19/2022)   Exercise Vital Sign    Days of Exercise per Week: 5 days  Minutes of Exercise per Session: 90 min  Stress: No Stress Concern Present (10/19/2022)   [place]    Feeling of Stress : Only a little  Social Connections: Moderately Integrated (10/19/2022)   Social Connection and Isolation Panel [NHANES]    Frequency of Communication with Friends and Family: More than three times a week    Frequency of Social Gatherings with Friends and Family: More than three times a week    Attends Religious Services: More than 4 times per year    Active Member of [employer] or Organizations: Yes    Attends Archivist Meetings: More than 4 times per year    Marital Status: Widowed    Activities of Daily Living    10/19/2022    7:53 AM  In your present state of health, do you have any difficulty performing the following activities:  Hearing? 0  Vision? 0  Difficulty concentrating or making decisions? 0  Walking or climbing stairs? 0  Dressing or bathing? 0  Doing errands, shopping? 0  Preparing Food and eating ? N  Using the Toilet? N  In the past six months, have you accidently leaked urine? Y  Do you have problems with loss of bowel control? N  Managing your Medications? N  Managing your Finances? N  Housekeeping or managing your Housekeeping? N    Patient Education/Literacy How often do you need to have someone help you when you read instructions, pamphlets, or other written materials from your doctor or pharmacy?: 1 - Never What is the last grade level you completed in  school?: Bachelor's degree  Exercise Current Exercise Habits: Home exercise routine, Type of exercise: walking, Time (Minutes): > 60, Frequency (Times/Week): 5, Weekly Exercise (Minutes/Week): 0, Intensity: Moderate, Exercise limited by: None identified  Diet Patient reports consuming 2 meals a day and 0 snack(s) a day Patient reports that her primary diet is: Regular Patient reports that Donna Ramsey does have regular access to food.   Depression Screen    10/19/2022    8:18 AM 05/08/2021    9:11 AM 06/27/2020   11:36 AM  PHQ 2/9 Scores  PHQ - 2 Score 0 2 0  PHQ- 9 Score  2 3     Fall Risk    10/19/2022    8:18 AM 10/19/2022    7:53 AM 05/08/2021    9:10 AM  Fall Risk   Falls in the past year? 0 0 1  Number falls in past yr: 0 0 0  Injury with Fall? 0 0 0  Risk for fall due to : No Fall Risks  No Fall Risks  Follow up Falls evaluation completed  Falls evaluation completed     Objective:   BP 121/73 (BP Location: Right Arm, Patient Position: Sitting, Cuff Size: Normal)   Pulse (!) 55   Ht '5\' 10"'$  (1.778 m)   Wt 146 lb 1.3 oz (66.3 kg)   SpO2 100%   BMI 20.96 kg/m   Last Weight  Most recent update: 10/19/2022  8:09 AM    Weight  66.3 kg (146 lb 1.3 oz)             Body mass index is 20.96 kg/m.  Hearing/Vision  Donna Ramsey did not have difficulty with hearing/understanding during the face-to-face interview Donna Ramsey did not have difficulty with her vision during the face-to-face interview Reports that Donna Ramsey has had a formal eye exam by an eye care professional within the past  year Reports that Donna Ramsey has not had a formal hearing evaluation within the past year  Cognitive Function:    10/19/2022    8:21 AM 05/08/2021    9:23 AM  6CIT Screen  What Year? 0 points 0 points  What month? 0 points 0 points  What time? 0 points 0 points  Count back from 20 0 points 0 points  Months in reverse 0 points 0 points  Repeat phrase 2 points 0 points  Total Score 2 points 0 points     Normal Cognitive Function Screening: Yes (Normal:0-7, Significant for Dysfunction: >8)  Immunization & Health Maintenance Record Immunization History  Administered Date(s) Administered   H1N1 11/19/2008   HIB (PRP-T) 02/03/2009   Influenza, High Dose Seasonal PF 09/16/2018, 09/16/2018, 08/25/2019, 08/25/2019   Influenza-Unspecified 09/15/2014, 10/08/2015, 08/21/2016, 09/24/2017, 08/25/2019   PFIZER(Purple Top)SARS-COV-2 Vaccination 01/14/2020, 02/11/2020, 08/15/2020   Pneumococcal Conjugate-13 01/15/2014   Pneumococcal Polysaccharide-23 08/29/2006, 09/21/2011   Td 09/08/2008   Tdap 03/30/2011   Zoster Recombinat (Shingrix) 02/28/2018, 02/28/2018, 08/12/2018, 08/12/2018    Health Maintenance  Topic Date Due   Meningococcal B Vaccine (1 of 4 - Increased Risk) 10/20/2022 (Originally 09/15/1961)   COVID-19 Vaccine (4 - Pfizer risk series) 11/04/2022 (Originally 10/10/2020)   INFLUENZA VACCINE  03/17/2023 (Originally 07/17/2022)   Pneumonia Vaccine 15+ Years old (3 - PPSV23 or PCV20) 10/20/2023 (Originally 09/14/2018)   DEXA SCAN  10/20/2023 (Originally 09/14/2018)   TETANUS/TDAP  10/20/2023 (Originally 03/29/2021)   COLONOSCOPY (Pts 45-26yr Insurance coverage will need to be confirmed)  04/12/2023   Medicare Annual Wellness (AWV)  10/20/2023   MAMMOGRAM  07/19/2024   Hepatitis C Screening  Completed   Zoster Vaccines- Shingrix  Completed   HPV VACCINES  Aged Out       Assessment  This is a routine wellness examination for Donna Ramsey  Health Maintenance: Due or Overdue There are no preventive care reminders to display for this patient.   Donna Ramsey not need a referral for Community Assistance: Care Management:   no Social Work:    no Prescription Assistance:  no Nutrition/Diabetes Education:  no   Plan:  Personalized Goals  Goals Addressed               This Visit's Progress     Patient Stated (pt-stated)        Patient would like to increase  her muscle mass.        Personalized Health Maintenance & Screening Recommendations  Pneumococcal vaccine  Influenza vaccine Td vaccine Bone densitometry screening Meningococcal B Vaccine  Lung Cancer Screening Recommended: no (Low Dose CT Chest recommended if Age 69-80years, 30 pack-year currently smoking OR have quit w/in past 15 years) Hepatitis C Screening recommended: no HIV Screening recommended: no  Advanced Directives: Written information was not given per the patient's request.  Referrals & Orders Orders Placed This Encounter  Procedures   [place]   Follow-up Plan Follow-up with MLuetta Nutting DO as planned Schedule your, influenza, pneumonia, td vaccine at your pharmacy.  Discuss meningococcal b vaccine with PCP.  Medicare wellness visit in one year. AVS printed and given to the patient.   I have personally reviewed and noted the following in the patient's chart:   Medical and social history Use of alcohol, tobacco or illicit drugs  Current medications and supplements Functional ability and status Nutritional status Physical activity Advanced directives List of other physicians Hospitalizations, surgeries, and ER visits in previous 12 months Vitals  Screenings to include cognitive, depression, and falls Referrals and appointments  In addition, I have reviewed and discussed with patient certain preventive protocols, quality metrics, and best practice recommendations. A written personalized care plan for preventive services as well as general preventive health recommendations were provided to patient.     Tinnie Gens, RN BSN  10/19/2022

## 2022-10-26 ENCOUNTER — Other Ambulatory Visit: Payer: Self-pay

## 2022-10-26 MED ORDER — TRIAMCINOLONE ACETONIDE 0.1 % EX CREA: 1.0000 | TOPICAL_CREAM | Freq: Every day | CUTANEOUS | 3 refills | Status: DC | PRN

## 2022-11-06 ENCOUNTER — Encounter: Payer: Self-pay | Admitting: Family Medicine

## 2022-11-06 ENCOUNTER — Other Ambulatory Visit (HOSPITAL_COMMUNITY): Payer: Self-pay

## 2022-11-06 ENCOUNTER — Ambulatory Visit (INDEPENDENT_AMBULATORY_CARE_PROVIDER_SITE_OTHER): Payer: Medicare Other | Admitting: Family Medicine

## 2022-11-06 VITALS — BP 114/72 | HR 65 | Ht 70.0 in | Wt 145.0 lb

## 2022-11-06 DIAGNOSIS — Z9189 Other specified personal risk factors, not elsewhere classified: Secondary | ICD-10-CM

## 2022-11-06 DIAGNOSIS — E7800 Pure hypercholesterolemia, unspecified: Secondary | ICD-10-CM

## 2022-11-06 DIAGNOSIS — Q8901 Asplenia (congenital): Secondary | ICD-10-CM

## 2022-11-06 MED ORDER — TRIAMCINOLONE ACETONIDE 0.1 % EX CREA
1.0000 | TOPICAL_CREAM | Freq: Every day | CUTANEOUS | 1 refills | Status: AC | PRN
  Filled 2022-11-06: qty 450, 90d supply, fill #0

## 2022-11-06 MED ORDER — TRIAMCINOLONE ACETONIDE 0.1 % EX CREA: 1.0000 | TOPICAL_CREAM | Freq: Every day | CUTANEOUS | 1 refills | Status: DC | PRN

## 2022-11-06 NOTE — Patient Instructions (Addendum)
Great to see you today! Talk with Dr. Bobetta Lime about meningitis B vaccine I would recommend Flu, Prevnar 20 and consider RSV vaccine.   We'll be in touch with Calcium score results.

## 2022-11-11 NOTE — Progress Notes (Signed)
Donna Ramsey - 71 y.o. female MRN [id number]  Date of birth: 1951-06-03  Subjective Chief Complaint  Patient presents with   Labs Only    HPI Donna Ramsey is a 71 y.o. female here today for follow up visit.    She brings in copies of labs that she had ordered by a functional/integrative provider.  This includes NMR lipoprofile.  This did show elevated HDL particles and VLDL particles.   Has family history of HLD.  She is not currently on statin.  She is taking fish oil.    Continues to see heme/onc for history of splenectomy and MGUS.  Stable at this time.   Needs renewal of triamcinolone cream.   ROS:  A comprehensive ROS was completed and negative except as noted per HPI  Allergies  Allergen Reactions   Clindamycin Hives   Codeine Hives and Rash   Pentazocine Hives   Sulfamethoxazole-Trimethoprim     Other reaction(s): Rash with itching (itching rash, pruritus) 2017 Reaction: rash, itching 03/16/2021: faint rash and itching 30 min after bactrim 400/80 oral challenge.    Doxycycline Other (See Comments)    Itching without rash near end of course Itching without rash near end of course    Levofloxacin Rash and Other (See Comments)    tendonitis    Neomycin Itching   Quinolones Other (See Comments)    Achilles Pain   Sulfa Antibiotics Rash    Past Medical History:  Diagnosis Date   Asplenia    Family history of breast cancer    GERD (gastroesophageal reflux disease)    Liver disease, unspecified    heterogenous echotexture   Portal hypertension ([place])    Splenic infarct     Past Surgical History:  Procedure Laterality Date   ABDOMINAL HYSTERECTOMY     BREAST BIOPSY Bilateral 50 yrs ago   benign   CHONDROPLASTY Left 07/28/2020   Procedure: CHONDROPLASTY LEFT KNEE;  Surgeon: Hiram Gash, MD;  Location: [facility];  Service: Orthopedics;  Laterality: Left;   HERNIA REPAIR     INGUINAL HERNIA REPAIR Right 05/10/2021   Procedure: RIGHT  INGUINAL HERNIA REPAIR WITH MESH;  Surgeon: Coralie Keens, MD;  Location: [facility];  Service: General;  Laterality: Right;   KNEE ARTHROSCOPY WITH LATERAL MENISECTOMY Left 07/28/2020   Procedure: LEFT KNEE ARTHROSCOPY WITH LATERAL MENISECTOMY;  Surgeon: Hiram Gash, MD;  Location: [facility];  Service: Orthopedics;  Laterality: Left;   KNEE ARTHROSCOPY WITH MEDIAL MENISECTOMY Left 07/28/2020   Procedure: LEFT KNEE ARTHROSCOPY WITH MEDIAL MENISECTOMY;  Surgeon: Hiram Gash, MD;  Location: [facility];  Service: Orthopedics;  Laterality: Left;   SPLENECTOMY, TOTAL      Social History   Socioeconomic History   Marital status: Widowed    Spouse name: Not on file   Number of children: 3   Years of education: 16   Highest education level: Bachelor's degree (e.g., BA, AB, BS)  Occupational History    Comment: Retired Software engineer  Tobacco Use   Smoking status: Never   Smokeless tobacco: Never  Vaping Use   Vaping Use: Never used  Substance and Sexual Activity   Alcohol use: Yes    Alcohol/week: 7.0 standard drinks of alcohol    Types: 7 Glasses of wine per week   Drug use: Never   Sexual activity: Never  Other Topics Concern   Not on file  Social History Narrative   Lives alone but next  to her daughter. She is walking 3-5 miles per day. She likes to read, cook and do yard work.   Social Determinants of Health   Financial Resource Strain: Low Risk  (10/19/2022)   Overall Financial Resource Strain (CARDIA)    Difficulty of Paying Living Expenses: Not hard at all  Food Insecurity: No Food Insecurity (10/19/2022)   Hunger Vital Sign    Worried About Running Out of Food in the Last Year: Never true    Ran Out of Food in the Last Year: Never true  Transportation Needs: No Transportation Needs (10/19/2022)   PRAPARE - Hydrologist (Medical): No    Lack of Transportation (Non-Medical): No  Physical Activity: Sufficiently Active  (10/19/2022)   Exercise Vital Sign    Days of Exercise per Week: 5 days    Minutes of Exercise per Session: 90 min  Stress: No Stress Concern Present (10/19/2022)   [place]    Feeling of Stress : Only a little  Social Connections: Moderately Integrated (10/19/2022)   Social Connection and Isolation Panel [NHANES]    Frequency of Communication with Friends and Family: More than three times a week    Frequency of Social Gatherings with Friends and Family: More than three times a week    Attends Religious Services: More than 4 times per year    Active Member of [employer] or Organizations: Yes    Attends Archivist Meetings: More than 4 times per year    Marital Status: Widowed    Family History  Problem Relation Age of Onset   Breast cancer Mother 60   Stroke Mother    Hypertension Father    Heart attack Father    Diabetes Sister    Breast cancer Paternal Aunt    Breast cancer Paternal Grandmother        dx 70s   Rheum arthritis Daughter     Health Maintenance  Topic Date Due   COVID-19 Vaccine (4 - 2023-24 season) 01/17/2023 (Originally 08/17/2022)   Meningococcal B Vaccine (1 of 4 - Increased Risk) 01/17/2023 (Originally 09/15/1961)   INFLUENZA VACCINE  03/17/2023 (Originally 07/17/2022)   Pneumonia Vaccine 79+ Years old (20 - PPSV23 or PCV20) 10/20/2023 (Originally 09/14/2018)   DEXA SCAN  10/20/2023 (Originally 09/14/2018)   COLONOSCOPY (Pts 45-67yr Insurance coverage will need to be confirmed)  04/12/2023   Medicare Annual Wellness (AWV)  10/20/2023   MAMMOGRAM  07/19/2024   Hepatitis C Screening  Completed   Zoster Vaccines- Shingrix  Completed   HPV VACCINES  Aged Out     ----------------------------------------------------------------------------------------------------------------------------------------------------------------------------------------------------------------- Physical Exam BP 114/72  (BP Location: Left Arm, Patient Position: Sitting, Cuff Size: Small)   Pulse 65   Ht '5\' 10"'$  (1.778 m)   Wt 145 lb (65.8 kg)   SpO2 100%   BMI 20.81 kg/m   Physical Exam Constitutional:      Appearance: Normal appearance.  HENT:     Head: Normocephalic and atraumatic.  Neurological:     Mental Status: She is alert.     ------------------------------------------------------------------------------------------------------------------------------------------------------------------------------------------------------------------- Assessment and Plan  Elevated LDL cholesterol level Mixed HLD.  Elevated VLDL and LDL levels.  Family history of CAD.  Not currently on statin.  Recommend coronary calcium scoring.  She would like to have this completed.    Asplenia She will discuss meningitis B vaccine with her hematologist.    Meds ordered this encounter  Medications   DISCONTD: triamcinolone  cream (KENALOG) 0.1 %    Sig: Apply 1 Application topically daily as needed (eczema).    Dispense:  450 g    Refill:  1   triamcinolone cream (KENALOG) 0.1 %    Sig: Apply topically once a day as needed (eczema).    Dispense:  450 g    Refill:  1    No follow-ups on file.    This visit occurred during the SARS-CoV-2 public health emergency.  Safety protocols were in place, including screening questions prior to the visit, additional usage of staff PPE, and extensive cleaning of exam room while observing appropriate contact time as indicated for disinfecting solutions.

## 2022-11-11 NOTE — Assessment & Plan Note (Signed)
She will discuss meningitis B vaccine with her hematologist.

## 2022-11-11 NOTE — Assessment & Plan Note (Signed)
Mixed HLD.  Elevated VLDL and LDL levels.  Family history of CAD.  Not currently on statin.  Recommend coronary calcium scoring.  She would like to have this completed.

## 2022-11-14 ENCOUNTER — Ambulatory Visit (INDEPENDENT_AMBULATORY_CARE_PROVIDER_SITE_OTHER): Payer: Medicare Other

## 2022-11-14 ENCOUNTER — Ambulatory Visit (INDEPENDENT_AMBULATORY_CARE_PROVIDER_SITE_OTHER): Payer: Self-pay

## 2022-11-14 DIAGNOSIS — M8580 Other specified disorders of bone density and structure, unspecified site: Secondary | ICD-10-CM | POA: Diagnosis not present

## 2022-11-14 DIAGNOSIS — Z0000 Encounter for general adult medical examination without abnormal findings: Secondary | ICD-10-CM | POA: Diagnosis not present

## 2022-11-14 DIAGNOSIS — Z136 Encounter for screening for cardiovascular disorders: Secondary | ICD-10-CM

## 2022-11-14 DIAGNOSIS — Z780 Asymptomatic menopausal state: Secondary | ICD-10-CM | POA: Diagnosis not present

## 2022-11-14 DIAGNOSIS — M85851 Other specified disorders of bone density and structure, right thigh: Secondary | ICD-10-CM | POA: Diagnosis not present

## 2022-11-14 DIAGNOSIS — Z9189 Other specified personal risk factors, not elsewhere classified: Secondary | ICD-10-CM

## 2022-12-22 ENCOUNTER — Other Ambulatory Visit (HOSPITAL_COMMUNITY): Payer: Self-pay

## 2022-12-22 MED ORDER — FLUAD QUADRIVALENT 0.5 ML IM PRSY
PREFILLED_SYRINGE | INTRAMUSCULAR | 0 refills | Status: DC
  Filled 2022-12-22: qty 0.5, 1d supply, fill #0

## 2022-12-24 ENCOUNTER — Other Ambulatory Visit (HOSPITAL_COMMUNITY): Payer: Self-pay

## 2022-12-24 DIAGNOSIS — K080 Exfoliation of teeth due to systemic causes: Secondary | ICD-10-CM | POA: Diagnosis not present

## 2023-01-17 ENCOUNTER — Other Ambulatory Visit (HOSPITAL_COMMUNITY): Payer: Self-pay

## 2023-01-17 ENCOUNTER — Encounter (HOSPITAL_COMMUNITY): Payer: Self-pay

## 2023-01-17 ENCOUNTER — Emergency Department (HOSPITAL_COMMUNITY)
Admission: EM | Admit: 2023-01-17 | Discharge: 2023-01-17 | Disposition: A | Discharge status: Home / Self Care | Payer: Medicare Other | Attending: Emergency Medicine | Admitting: Emergency Medicine

## 2023-01-17 ENCOUNTER — Other Ambulatory Visit: Payer: Self-pay

## 2023-01-17 ENCOUNTER — Emergency Department (HOSPITAL_COMMUNITY): Payer: Medicare Other

## 2023-01-17 DIAGNOSIS — I4891 Unspecified atrial fibrillation: Secondary | ICD-10-CM | POA: Insufficient documentation

## 2023-01-17 DIAGNOSIS — Z7901 Long term (current) use of anticoagulants: Secondary | ICD-10-CM | POA: Diagnosis not present

## 2023-01-17 DIAGNOSIS — R002 Palpitations: Secondary | ICD-10-CM | POA: Diagnosis not present

## 2023-01-17 LAB — CBC WITH DIFFERENTIAL/PLATELET
Abs Immature Granulocytes: 0.02 10*3/uL (ref 0.00–0.07)
Basophils Absolute: 0.1 10*3/uL (ref 0.0–0.1)
Basophils Relative: 1 %
Eosinophils Absolute: 0.1 10*3/uL (ref 0.0–0.5)
Eosinophils Relative: 1 %
HCT: 39.2 % (ref 36.0–46.0)
Hemoglobin: 14.6 g/dL (ref 12.0–15.0)
Immature Granulocytes: 0 %
Lymphocytes Relative: 32 %
Lymphs Abs: 2 10*3/uL (ref 0.7–4.0)
MCH: 35.1 pg — ABNORMAL HIGH (ref 26.0–34.0)
MCHC: 37.2 g/dL — ABNORMAL HIGH (ref 30.0–36.0)
MCV: 94.2 fL (ref 80.0–100.0)
Monocytes Absolute: 0.7 10*3/uL (ref 0.1–1.0)
Monocytes Relative: 12 %
Neutro Abs: 3.3 10*3/uL (ref 1.7–7.7)
Neutrophils Relative %: 54 %
Platelets: 283 10*3/uL (ref 150–400)
RBC: 4.16 MIL/uL (ref 3.87–5.11)
RDW: 13.7 % (ref 11.5–15.5)
WBC: 6.1 10*3/uL (ref 4.0–10.5)
nRBC: 0 % (ref 0.0–0.2)

## 2023-01-17 LAB — HEPATIC FUNCTION PANEL
ALT: 35 U/L (ref 0–44)
AST: 53 U/L — ABNORMAL HIGH (ref 15–41)
Albumin: 3.6 g/dL (ref 3.5–5.0)
Alkaline Phosphatase: 63 U/L (ref 38–126)
Bilirubin, Direct: 0.2 mg/dL (ref 0.0–0.2)
Indirect Bilirubin: 0.8 mg/dL (ref 0.3–0.9)
Total Bilirubin: 1 mg/dL (ref 0.3–1.2)
Total Protein: 6.8 g/dL (ref 6.5–8.1)

## 2023-01-17 LAB — BASIC METABOLIC PANEL
Anion gap: 10 (ref 5–15)
BUN: 14 mg/dL (ref 8–23)
CO2: 22 mmol/L (ref 22–32)
Calcium: 9.4 mg/dL (ref 8.9–10.3)
Chloride: 103 mmol/L (ref 98–111)
Creatinine, Ser: 0.59 mg/dL (ref 0.44–1.00)
GFR, Estimated: >60 mL/min (ref >60)
Glucose, Bld: 107 mg/dL — ABNORMAL HIGH (ref 70–99)
Potassium: 3.8 mmol/L (ref 3.5–5.1)
Sodium: 135 mmol/L (ref 135–145)

## 2023-01-17 LAB — TSH: TSH: 2.021 u[IU]/mL (ref 0.350–4.500)

## 2023-01-17 LAB — MAGNESIUM: Magnesium: 1.9 mg/dL (ref 1.7–2.4)

## 2023-01-17 LAB — T4, FREE: Free T4: 0.97 ng/dL (ref 0.61–1.12)

## 2023-01-17 MED ORDER — APIXABAN 5 MG PO TABS: 5.0000 mg | ORAL_TABLET | Freq: Two times a day (BID) | ORAL | Status: DC

## 2023-01-17 MED ORDER — APIXABAN 5 MG PO TABS
5.0000 mg | ORAL_TABLET | Freq: Once | ORAL | Status: AC
  Administered 2023-01-17: 5 mg via ORAL
  Filled 2023-01-17: qty 1

## 2023-01-17 MED ORDER — DILTIAZEM LOAD VIA INFUSION
15.0000 mg | Freq: Once | INTRAVENOUS | Status: AC
  Administered 2023-01-17: 15 mg via INTRAVENOUS
  Filled 2023-01-17: qty 15

## 2023-01-17 MED ORDER — DILTIAZEM HCL ER COATED BEADS 180 MG PO CP24
180.0000 mg | ORAL_CAPSULE | Freq: Every day | ORAL | Status: DC
  Administered 2023-01-17: 180 mg via ORAL
  Filled 2023-01-17: qty 1

## 2023-01-17 MED ORDER — DILTIAZEM HCL-DEXTROSE 125-5 MG/125ML-% IV SOLN (PREMIX)
5.0000 mg/h | INTRAVENOUS | Status: DC
  Administered 2023-01-17: 5 mg/h via INTRAVENOUS
  Filled 2023-01-17: qty 125

## 2023-01-17 MED ORDER — SODIUM CHLORIDE 0.9 % IV BOLUS
1000.0000 mL | Freq: Once | INTRAVENOUS | Status: AC
  Administered 2023-01-17: 1000 mL via INTRAVENOUS

## 2023-01-17 MED ORDER — DILTIAZEM HCL ER COATED BEADS 180 MG PO CP24: 180.0000 mg | ORAL_CAPSULE | Freq: Every day | ORAL | 0 refills | Status: DC

## 2023-01-17 MED ORDER — APIXABAN 5 MG PO TABS: 5.0000 mg | ORAL_TABLET | Freq: Two times a day (BID) | ORAL | 0 refills | Status: DC

## 2023-01-17 NOTE — Discharge Instructions (Signed)
Take next dose of diltiazem tomorrow morning.  Take next dose of your Eliquis tonight.  Take as prescribed.

## 2023-01-17 NOTE — ED Notes (Signed)
Discharge instructions reviewed with patient. Patient denies any questions or concerns. Patient ambulatory out to ED.

## 2023-01-17 NOTE — ED Provider Notes (Signed)
[facility] Provider Note   CSN: [id number] Arrival date & time: 01/17/23  1245     History  Chief Complaint  Patient presents with   Palpitations    Donna Ramsey is a 72 y.o. female.  Patient here with palpitations since last night.  She was wearing her Apple Watch when she was getting notifications overnight that her heart rate was in the 150s.  She took some supplements yesterday that made her have some diarrhea and then had a little bit of wine but otherwise was in her normal state of health yesterday.  No chest pain or shortness of breath.  She does endorse some fluttering in her chest but denies any nausea, vomiting, diarrhea, pain.  No history of A-fib.  She has strong family history of A-fib.  She does not take any medications and only takes supplements.  The history is provided by the patient.       Home Medications Prior to Admission medications   Medication Sig Start Date End Date Taking? Authorizing Provider  apixaban (ELIQUIS) 5 MG TABS tablet Take 1 tablet (5 mg total) by mouth 2 (two) times daily. 01/17/23 02/16/23 Yes Viktoriya Glaspy, DO  diltiazem (CARDIZEM CD) 180 MG 24 hr capsule Take 1 capsule (180 mg total) by mouth daily. 01/17/23 02/16/23 Yes Marrion Finan, DO  influenza vaccine adjuvanted (FLUAD QUADRIVALENT) 0.5 ML injection To be administered by the pharamcist 12/22/22   Carlyle Basques, MD  NON FORMULARY Take 2 capsules by mouth daily. Citrus bioflavanoids    [provider]  Omega 3 1000 MG CAPS Take 2 capsules by mouth daily.    [provider]  Probiotic Product (PRO-BIOTIC BLEND PO) Take 1 capsule by mouth daily.    [provider]  triamcinolone cream (KENALOG) 0.1 % Apply topically once a day as needed (eczema). 11/06/22   Luetta Nutting, DO      Allergies    Clindamycin, Codeine, Pentazocine, Sulfamethoxazole-trimethoprim, Doxycycline, Levofloxacin, Neomycin, Quinolones, and Sulfa  antibiotics    Review of Systems   Review of Systems  Physical Exam Updated Vital Signs BP 113/79   Pulse 76   Temp (!) 97.4 F (36.3 C) (Oral)   Resp 18   Ht 5' 9.5" (1.765 m)   Wt 65.3 kg   SpO2 100%   BMI 20.96 kg/m  Physical Exam Vitals and nursing note reviewed.  Constitutional:      General: She is not in acute distress.    Appearance: She is well-developed. She is not ill-appearing.  HENT:     Head: Normocephalic and atraumatic.     Nose: Nose normal.     Mouth/Throat:     Mouth: Mucous membranes are moist.  Eyes:     Extraocular Movements: Extraocular movements intact.     Conjunctiva/sclera: Conjunctivae normal.     Pupils: Pupils are equal, round, and reactive to light.  Cardiovascular:     Rate and Rhythm: Tachycardia present. Rhythm irregular.     Heart sounds: No murmur heard. Pulmonary:     Effort: Pulmonary effort is normal. No respiratory distress.     Breath sounds: Normal breath sounds.  Abdominal:     Palpations: Abdomen is soft.     Tenderness: There is no abdominal tenderness.  Musculoskeletal:        General: No swelling.     Cervical back: Normal range of motion and neck supple.  Skin:    General: Skin is warm and  dry.     Capillary Refill: Capillary refill takes less than 2 seconds.  Neurological:     General: No focal deficit present.     Mental Status: She is alert.  Psychiatric:        Mood and Affect: Mood normal.     ED Results / Procedures / Treatments   Labs (all labs ordered are listed, but only abnormal results are displayed) Labs Reviewed  CBC WITH DIFFERENTIAL/PLATELET - Abnormal; Notable for the following components:      Result Value   MCH 35.1 (*)    MCHC 37.2 (*)    All other components within normal limits  BASIC METABOLIC PANEL - Abnormal; Notable for the following components:   Glucose, Bld 107 (*)    All other components within normal limits  MAGNESIUM  TSH  T4, FREE  HEPATIC FUNCTION PANEL    EKG EKG  Interpretation  Date/Time:  Thursday January 17 2023 09:35:58 EST Ventricular Rate:  136 PR Interval:    QRS Duration: 120 QT Interval:  312 QTC Calculation: 470 R Axis:   69 Text Interpretation: Atrial fibrillation Nonspecific intraventricular conduction delay Nonspecific repol abnormality, diffuse leads Confirmed by Lennice Sites ([id number]) on 01/17/2023 9:44:04 AM  Radiology DG Chest Portable 1 View  Result Date: 01/17/2023 CLINICAL DATA:  New onset atrial fibrillation EXAM: PORTABLE CHEST 1 VIEW COMPARISON:  08/30/2019 FINDINGS: Artifact from EKG leads. Hyperinflation and interstitial coarsening which is stable. Normal heart size and mediastinal contours. There is no edema, consolidation, effusion, or pneumothorax. Artifact from EKG leads. IMPRESSION: Stable exam.  No evidence of acute disease. Electronically Signed   By: Jorje Guild M.D.   On: 01/17/2023 10:28    Procedures Procedures    Medications Ordered in ED Medications  diltiazem (CARDIZEM CD) 24 hr capsule 180 mg (180 mg Oral Given 01/17/23 1104)  sodium chloride 0.9 % bolus 1,000 mL (0 mLs Intravenous Stopped 01/17/23 1117)  diltiazem (CARDIZEM) 1 mg/mL load via infusion 15 mg (15 mg Intravenous Bolus from Bag 01/17/23 1000)  apixaban (ELIQUIS) tablet 5 mg (5 mg Oral Given 01/17/23 1115)    ED Course/ Medical Decision Making/ A&P         CHA2DS2-VASc Score: 2                    Medical Decision Making Amount and/or Complexity of Data Reviewed Labs: ordered. Radiology: ordered.  Risk Prescription drug management.   Radford Pax is here with palpitations.  Unremarkable vitals except for tachycardia in the 120-130 range.  EKG shows atrial fibrillation with RVR.  This started overnight.  Her Apple Watch is telling her this.  She has some diarrhea yesterday.  Drank a little bit of wine.  Otherwise she denies any chest pain or shortness of breath.  She has no major medical problems.  Mali Vascor is 2.  She has family  history of A-fib in both of her siblings.  This seems idiopathic but will check CBC, BMP, magnesium, chest x-ray, thyroid studies.  Will give her dose IV diltiazem and infusion until I see blood work.  Hope that we can transition her to oral medications and Eliquis.  Per my reevaluation her heart rate more consistently in the 80-100 range.  Talked with Dr. Nechama Guard with cardiology.  After review and interpretation of labs is no significant anemia or electrolyte abnormality.  We can try to transition her to oral diltiazem and start her on Eliquis and have her follow-up with  atrial fibrillation clinic.  She does not prefer cardioversion at this time.  Patient with normal thyroid studies per my review and interpretation.  Patient was observed for a while after starting oral diltiazem and heart rate maintained in the 80s and 90s.  She understands return precautions.  She has been arranged for atrial fibrillation clinic follow-up.  Discharged in good condition.  This chart was dictated using voice recognition software.  Despite best efforts to proofread,  errors can occur which can change the documentation meaning.         Final Clinical Impression(s) / ED Diagnoses Final diagnoses:  Atrial fibrillation with RVR ([place])    Rx / DC Orders ED Discharge Orders          Ordered    Amb referral to AFIB Clinic        01/17/23 1057    diltiazem (CARDIZEM CD) 180 MG 24 hr capsule  Daily        01/17/23 1105    apixaban (ELIQUIS) 5 MG TABS tablet  2 times daily        01/17/23 1105              [place], [place], DO 01/17/23 1220

## 2023-01-17 NOTE — TOC Benefit Eligibility Note (Signed)
Patient Advocate Encounter  Insurance verification completed.    The patient is currently admitted and upon discharge could be taking Eliquis 5 mg.  The current 30 day co-pay is $45.00.   The patient is insured through [employer] Part D   Tametra Ahart, CPHT Pharmacy Patient Advocate Specialist [facility] Patient Advocate Team Direct Number: [phone]  Fax: [phone]       

## 2023-01-17 NOTE — ED Triage Notes (Signed)
Pt came in via pOV d/t her apple watch waking her up & saying that her heart rate was in the 150's & that she was in A-fib, states that she doe snot have a Hx of A-fib but she does have family Hx of it. Pt does endorse feeling fluttering in her chest & lightheaded but denies any pain or nausea.

## 2023-01-22 ENCOUNTER — Ambulatory Visit (HOSPITAL_COMMUNITY)
Admit: 2023-01-22 | Discharge: 2023-01-22 | Disposition: A | Discharge status: Home / Self Care | Payer: Medicare Other | Attending: Nurse Practitioner | Admitting: Nurse Practitioner

## 2023-01-22 VITALS — BP 112/74 | HR 83 | Ht 69.5 in | Wt 146.6 lb

## 2023-01-22 DIAGNOSIS — Z7901 Long term (current) use of anticoagulants: Secondary | ICD-10-CM | POA: Insufficient documentation

## 2023-01-22 DIAGNOSIS — D6869 Other thrombophilia: Secondary | ICD-10-CM | POA: Diagnosis not present

## 2023-01-22 DIAGNOSIS — I4891 Unspecified atrial fibrillation: Secondary | ICD-10-CM

## 2023-01-22 DIAGNOSIS — G4733 Obstructive sleep apnea (adult) (pediatric): Secondary | ICD-10-CM

## 2023-01-22 DIAGNOSIS — R7989 Other specified abnormal findings of blood chemistry: Secondary | ICD-10-CM | POA: Diagnosis not present

## 2023-01-22 NOTE — Progress Notes (Signed)
Primary Care Physician: Luetta Nutting, DO Referring Physician: ED f/u    Donna Ramsey is a 72 y.o. female with a h/o abnormal liver function tests that presented with palpitations 01/17/23 and apple watch had notified pt. She was found to be in new onset afib with RVR. She was started on eliquis 5 mg bid and diltiazem 180 mg daily. She is in the afib clinic for f/u and EKG shows rate controlled afib.  She states that overall she feels well and tolerated a very long walk yesterday of several miles. She lives alone, with he husband dying a few years back but her apple watch has also alerted her that she snores. Since her husbands death she lost 11 lbs and then went on a health wellness journey with her h/o elevated liver enzymes which have stabilized. She takes many supplements, but has stopped tumeric with the addition of eliquis.  Minimal caffeine and alcohol.   Today, she denies symptoms of palpitations, chest pain, shortness of breath, orthopnea, PND, lower extremity edema, dizziness, presyncope, syncope, or neurologic sequela. The patient is tolerating medications without difficulties and is otherwise without complaint today.   Past Medical History:  Diagnosis Date   Asplenia    Family history of breast cancer    GERD (gastroesophageal reflux disease)    Liver disease, unspecified    heterogenous echotexture   Portal hypertension ([place])    Splenic infarct    Past Surgical History:  Procedure Laterality Date   ABDOMINAL HYSTERECTOMY     BREAST BIOPSY Bilateral 50 yrs ago   benign   CHONDROPLASTY Left 07/28/2020   Procedure: CHONDROPLASTY LEFT KNEE;  Surgeon: Hiram Gash, MD;  Location: [place];  Service: Orthopedics;  Laterality: Left;   HERNIA REPAIR     INGUINAL HERNIA REPAIR Right 05/10/2021   Procedure: RIGHT INGUINAL HERNIA REPAIR WITH MESH;  Surgeon: Coralie Keens, MD;  Location: [facility];  Service: General;  Laterality: Right;   KNEE ARTHROSCOPY WITH  LATERAL MENISECTOMY Left 07/28/2020   Procedure: LEFT KNEE ARTHROSCOPY WITH LATERAL MENISECTOMY;  Surgeon: Hiram Gash, MD;  Location: [facility];  Service: Orthopedics;  Laterality: Left;   KNEE ARTHROSCOPY WITH MEDIAL MENISECTOMY Left 07/28/2020   Procedure: LEFT KNEE ARTHROSCOPY WITH MEDIAL MENISECTOMY;  Surgeon: Hiram Gash, MD;  Location: [facility];  Service: Orthopedics;  Laterality: Left;   SPLENECTOMY, TOTAL      Current Outpatient Medications  Medication Sig Dispense Refill   Acetylcysteine 600 MG CAPS Take 2 capsules by mouth 2 (two) times daily.     apixaban (ELIQUIS) 5 MG TABS tablet Take 1 tablet (5 mg total) by mouth 2 (two) times daily. 60 tablet 0   Ascorbic Acid (VITAMIN C) 500 MG CAPS Take 1 capsule by mouth every morning. liposoma     b complex vitamins capsule Take 1 capsule by mouth daily.     Cholecalciferol (VITAMIN D-3 PO) Take 1 capsule by mouth every morning. With K2     Cholecalciferol (VITAMIN D3) 125 MCG (5000 UT) CAPS Take 1 capsule by mouth every morning.     Coenzyme Q10 (CO Q 10) 100 MG CAPS Take 1 capsule by mouth every morning.     diltiazem (CARDIZEM CD) 180 MG 24 hr capsule Take 1 capsule (180 mg total) by mouth daily. 30 capsule 0   GLYCINE PO 3,000 mg by mouth daily- Taking 3 capsules by mouth at bedtime     influenza vaccine  adjuvanted (FLUAD QUADRIVALENT) 0.5 ML injection To be administered by the pharamcist 0.5 mL 0   MAGNESIUM GLYCINATE PO Take 2 capsules by mouth at bedtime. 3 in 1-Threonate Advanced Complex     MILK THISTLE PO Take 1 capsule by mouth 2 (two) times daily.     Nutritional Supplements (CHLORELLA-SPIRULINA COMPLEX PO) Take 6 tablets by mouth every morning.     Omega 3 1000 MG CAPS Take 2 capsules by mouth daily. Ultra Omega 2x     Quercetin 250 MG TABS Take 1 tablet by mouth 2 (two) times daily.     triamcinolone cream (KENALOG) 0.1 % Apply topically once a day as needed (eczema). 450 g 1   ZINC  GLYCINATE PO Take 15 mg by mouth every morning.     NON FORMULARY Take 2 capsules by mouth daily. Citrus bioflavanoids (Patient not taking: Reported on 01/22/2023)     Probiotic Product (PRO-BIOTIC BLEND PO) Take 1 capsule by mouth daily. (Patient not taking: Reported on 01/22/2023)     No current facility-administered medications for this encounter.    Allergies  Allergen Reactions   Clindamycin Hives   Codeine Hives and Rash   Pentazocine Hives   Sulfamethoxazole-Trimethoprim     Other reaction(s): Rash with itching (itching rash, pruritus) 2017 Reaction: rash, itching 03/16/2021: faint rash and itching 30 min after bactrim 400/80 oral challenge.    Doxycycline Other (See Comments)    Itching without rash near end of course Itching without rash near end of course    Levofloxacin Rash and Other (See Comments)    tendonitis    Neomycin Itching   Quinolones Other (See Comments)    Achilles Pain   Sulfa Antibiotics Rash    Social History   Socioeconomic History   Marital status: Widowed    Spouse name: Not on file   Number of children: 3   Years of education: 16   Highest education level: Bachelor's degree (e.g., BA, AB, BS)  Occupational History    Comment: Retired Software engineer  Tobacco Use   Smoking status: Never   Smokeless tobacco: Never  Vaping Use   Vaping Use: Never used  Substance and Sexual Activity   Alcohol use: Yes    Alcohol/week: 7.0 standard drinks of alcohol    Types: 7 Glasses of wine per week   Drug use: Never   Sexual activity: Never  Other Topics Concern   Not on file  Social History Narrative   Lives alone but next to her daughter. She is walking 3-5 miles per day. She likes to read, cook and do yard work.   Social Determinants of Health   Financial Resource Strain: Low Risk  (10/19/2022)   Overall Financial Resource Strain (CARDIA)    Difficulty of Paying Living Expenses: Not hard at all  Food Insecurity: No Food Insecurity (10/19/2022)   Hunger  Vital Sign    Worried About Running Out of Food in the Last Year: Never true    Ran Out of Food in the Last Year: Never true  Transportation Needs: No Transportation Needs (10/19/2022)   PRAPARE - Hydrologist (Medical): No    Lack of Transportation (Non-Medical): No  Physical Activity: Sufficiently Active (10/19/2022)   Exercise Vital Sign    Days of Exercise per Week: 5 days    Minutes of Exercise per Session: 90 min  Stress: No Stress Concern Present (10/19/2022)   [place]  Feeling of Stress : Only a little  Social Connections: Moderately Integrated (10/19/2022)   Social Connection and Isolation Panel [NHANES]    Frequency of Communication with Friends and Family: More than three times a week    Frequency of Social Gatherings with Friends and Family: More than three times a week    Attends Religious Services: More than 4 times per year    Active Member of [employer] or Organizations: Yes    Attends Archivist Meetings: More than 4 times per year    Marital Status: Widowed  Intimate Partner Violence: Not At Risk (10/19/2022)   Humiliation, Afraid, Rape, and Kick questionnaire    Fear of Current or Ex-Partner: No    Emotionally Abused: No    Physically Abused: No    Sexually Abused: No    Family History  Problem Relation Age of Onset   Breast cancer Mother 54   Stroke Mother    Hypertension Father    Heart attack Father    Diabetes Sister    Breast cancer Paternal Aunt    Breast cancer Paternal Grandmother        dx 49s   Rheum arthritis Daughter     ROS- All systems are reviewed and negative except as per the HPI above  Physical Exam: Vitals:   01/22/23 1036  BP: 112/74  Pulse: 83  Weight: 66.5 kg  Height: 5' 9.5" (1.765 m)   Wt Readings from Last 3 Encounters:  01/22/23 66.5 kg  01/17/23 65.3 kg  11/06/22 65.8 kg    Labs: Lab Results  Component Value Date    NA 135 01/17/2023   K 3.8 01/17/2023   CL 103 01/17/2023   CO2 22 01/17/2023   GLUCOSE 107 (H) 01/17/2023   BUN 14 01/17/2023   CREATININE 0.59 01/17/2023   CALCIUM 9.4 01/17/2023   MG 1.9 01/17/2023   No results found for: "INR" Lab Results  Component Value Date   CHOL 269 (H) 05/09/2021   HDL 132 05/09/2021   LDLCALC 125 (H) 05/09/2021   TRIG 39 05/09/2021     GEN- The patient is well appearing, alert and oriented x 3 today.   Head- normocephalic, atraumatic Eyes-  Sclera clear, conjunctiva pink Ears- hearing intact Oropharynx- clear Neck- supple, no JVP Lymph- no cervical lymphadenopathy Lungs- Clear to ausculation bilaterally, normal work of breathing Heart- irregular rate and rhythm, no murmurs, rubs or gallops, PMI not laterally displaced GI- soft, NT, ND, + BS Extremities- no clubbing, cyanosis, or edema MS- no significant deformity or atrophy Skin- no rash or lesion Psych- euthymic mood, full affect Neuro- strength and sensation are intact  EKG-Vent. rate 83 BPM PR interval * ms QRS duration 82 ms QT/QTcB 364/427 ms P-R-T axes * 70 31 Atrial fibrillation Abnormal ECG When compared with ECG of 17-Jan-2023 09:35, PREVIOUS ECG IS PRESENT  No prior echo, will order when in SR  Assessment and Plan:  1. New onset afib No apparent trigger General education re afib and triggers  Continue diltiazem 180 mg daily Can continue to walk for exercise  if continues to be asymptomatic  Echo when in SR Refer for sleep study  Avoid alcohol  Her brother and sister(since deceased)  both will h/o of afib   2. CHA2DS2VASc score of 2 Continue eliquis 5 mg bid   Bleeding precautions discussed   I will see back in 2 weeks If continues in afib, then cardioversion will be most likely pursed She would like to  be referred to EP re possible ablation at at some point in the future   Butch Penny C. Nikesh Teschner, [facility] [address] [place], [place] [address] [phone]

## 2023-01-24 ENCOUNTER — Telehealth: Payer: Self-pay

## 2023-01-24 ENCOUNTER — Encounter (HOSPITAL_COMMUNITY): Payer: Self-pay | Admitting: *Deleted

## 2023-01-24 NOTE — Telephone Encounter (Signed)
     Patient  visit on 2/1  at [facility]    Have you been able to follow up with your primary care physician? Yes   The patient was or was not able to obtain any needed medicine or equipment. Yes   Are there diet recommendations that you are having difficulty following? Na   Patient expresses understanding of discharge instructions and education provided has no other needs at this time.  Yes        [facility] [phone] [address], [place], [place] [address] Phone: [phone] Email: [email or web address]

## 2023-02-06 ENCOUNTER — Other Ambulatory Visit (HOSPITAL_COMMUNITY): Payer: Self-pay

## 2023-02-06 ENCOUNTER — Ambulatory Visit (HOSPITAL_COMMUNITY)
Admission: RE | Admit: 2023-02-06 | Discharge: 2023-02-06 | Disposition: A | Discharge status: Home / Self Care | Payer: Medicare Other | Source: Ambulatory Visit | Attending: Nurse Practitioner | Admitting: Nurse Practitioner

## 2023-02-06 ENCOUNTER — Encounter (HOSPITAL_COMMUNITY): Payer: Self-pay | Admitting: Nurse Practitioner

## 2023-02-06 VITALS — BP 116/80 | HR 53 | Ht 69.5 in | Wt 148.4 lb

## 2023-02-06 DIAGNOSIS — D6869 Other thrombophilia: Secondary | ICD-10-CM

## 2023-02-06 DIAGNOSIS — I4891 Unspecified atrial fibrillation: Secondary | ICD-10-CM | POA: Diagnosis not present

## 2023-02-06 MED ORDER — DILTIAZEM HCL ER COATED BEADS 120 MG PO CP24
120.0000 mg | ORAL_CAPSULE | Freq: Every day | ORAL | 5 refills | Status: DC
  Filled 2023-02-06: qty 30, 30d supply, fill #0
  Filled 2023-03-02: qty 30, 30d supply, fill #1

## 2023-02-06 MED ORDER — DILTIAZEM HCL 30 MG PO TABS
ORAL_TABLET | ORAL | 1 refills | Status: AC
  Filled 2023-02-06: qty 30, 5d supply, fill #0
  Filled 2023-02-06: qty 18, 2d supply, fill #0
  Filled 2023-02-07: qty 12, 3d supply, fill #0

## 2023-02-06 MED ORDER — APIXABAN 5 MG PO TABS
5.0000 mg | ORAL_TABLET | Freq: Two times a day (BID) | ORAL | 3 refills | Status: DC
  Filled 2023-02-06: qty 60, 30d supply, fill #0
  Filled 2023-03-02: qty 60, 30d supply, fill #1

## 2023-02-06 NOTE — Patient Instructions (Addendum)
Decrease cardizem 147m once a day    Cardizem 361m-- Take 1 tablet every 4 hours AS NEEDED for AFIB heart rate >100 as long as top BP >100.

## 2023-02-06 NOTE — Progress Notes (Signed)
Primary Care Physician: Luetta Nutting, DO Referring Physician: ED f/u    Donna Ramsey is a 72 y.o. female with a h/o abnormal liver function tests that presented with palpitations 01/17/23 and apple watch had notified pt. She was found to be in new onset afib with RVR. She was started on eliquis 5 mg bid and diltiazem 180 mg daily. She is in the afib clinic for f/u and EKG shows rate controlled afib.  She states that overall she feels well and tolerated a very long walk yesterday of several miles. She lives alone, with he husband dying a few years back but her apple watch has also alerted her that she snores. Since her husbands death she lost 44 lbs and then went on a health wellness journey with her h/o elevated liver enzymes which have stabilized. She takes many supplements, but has stopped tumeric with the addition of eliquis.  Minimal caffeine and alcohol.   F/u in afib clinic, 02/06/23. She converted to SR right after last visit here. EKG shows sinus brady at 53 bpm. Will obtain echo. Sleep study pending. Still on eliquis 5 mg bid for a CHA2DS2VASc  score of 2. If no LV dysfunction on echo,  by guidelines may be able to stop. She is pending f/u with Dr. Quentin Ore 03/06/23.   Today, she denies symptoms of palpitations, chest pain, shortness of breath, orthopnea, PND, lower extremity edema, dizziness, presyncope, syncope, or neurologic sequela. The patient is tolerating medications without difficulties and is otherwise without complaint today.   Past Medical History:  Diagnosis Date   Asplenia    Family history of breast cancer    GERD (gastroesophageal reflux disease)    Liver disease, unspecified    heterogenous echotexture   Portal hypertension ([place])    Splenic infarct    Past Surgical History:  Procedure Laterality Date   ABDOMINAL HYSTERECTOMY     BREAST BIOPSY Bilateral 50 yrs ago   benign   CHONDROPLASTY Left 07/28/2020   Procedure: CHONDROPLASTY LEFT KNEE;  Surgeon: Hiram Gash, MD;  Location: [facility];  Service: Orthopedics;  Laterality: Left;   HERNIA REPAIR     INGUINAL HERNIA REPAIR Right 05/10/2021   Procedure: RIGHT INGUINAL HERNIA REPAIR WITH MESH;  Surgeon: Coralie Keens, MD;  Location[facility];  Service: General;  Laterality: Right;   KNEE ARTHROSCOPY WITH LATERAL MENISECTOMY Left 07/28/2020   Procedure: LEFT KNEE ARTHROSCOPY WITH LATERAL MENISECTOMY;  Surgeon: Hiram Gash, MD;  Location: [place];  Service: Orthopedics;  Laterality: Left;   KNEE ARTHROSCOPY WITH MEDIAL MENISECTOMY Left 07/28/2020   Procedure: LEFT KNEE ARTHROSCOPY WITH MEDIAL MENISECTOMY;  Surgeon: Hiram Gash, MD;  Location: [facility];  Service: Orthopedics;  Laterality: Left;   SPLENECTOMY, TOTAL      Current Outpatient Medications  Medication Sig Dispense Refill   Acetylcysteine 600 MG CAPS Take 2 capsules by mouth 2 (two) times daily.     Ascorbic Acid (VITAMIN C) 500 MG CAPS Take 1 capsule by mouth every morning. liposoma     b complex vitamins capsule Take 1 capsule by mouth daily.     Cholecalciferol (VITAMIN D-3 PO) Take 1 capsule by mouth every morning. With K2     Coenzyme Q10 (CO Q 10) 100 MG CAPS Take 1 capsule by mouth every morning.     diltiazem (CARDIZEM) 30 MG tablet Take 1 tablet every 4 hours AS NEEDED for AFIB heart rate >100 as long  as top BP >100. 30 tablet 1   GLYCINE PO 3,000 mg by mouth daily- Taking 3 capsules by mouth at bedtime     MAGNESIUM GLYCINATE PO Take 2 capsules by mouth at bedtime. 3 in 1-Threonate Advanced Complex     MILK THISTLE PO Take 1 capsule by mouth 2 (two) times daily.     Nutritional Supplements (CHLORELLA-SPIRULINA COMPLEX PO) Take 6 tablets by mouth every morning.     Quercetin 250 MG TABS Take 1 tablet by mouth 2 (two) times daily.     triamcinolone cream (KENALOG) 0.1 % Apply topically once a day as needed (eczema). 450 g 1   ZINC GLYCINATE PO Take 15 mg by mouth every morning.      apixaban (ELIQUIS) 5 MG TABS tablet Take 1 tablet (5 mg total) by mouth 2 (two) times daily. 60 tablet 3   Cholecalciferol (VITAMIN D3) 125 MCG (5000 UT) CAPS Take 1 capsule by mouth every morning. (Patient not taking: Reported on 02/06/2023)     diltiazem (CARDIZEM CD) 120 MG 24 hr capsule Take 1 capsule (120 mg total) by mouth daily. 30 capsule 5   No current facility-administered medications for this encounter.    Allergies  Allergen Reactions   Clindamycin Hives   Codeine Hives and Rash   Pentazocine Hives   Sulfamethoxazole-Trimethoprim     Other reaction(s): Rash with itching (itching rash, pruritus) 2017 Reaction: rash, itching 03/16/2021: faint rash and itching 30 min after bactrim 400/80 oral challenge.    Doxycycline Other (See Comments)    Itching without rash near end of course Itching without rash near end of course    Levofloxacin Rash and Other (See Comments)    tendonitis    Neomycin Itching   Quinolones Other (See Comments)    Achilles Pain   Sulfa Antibiotics Rash    Social History   Socioeconomic History   Marital status: Widowed    Spouse name: Not on file   Number of children: 3   Years of education: 16   Highest education level: Bachelor's degree (e.g., BA, AB, BS)  Occupational History    Comment: Retired Software engineer  Tobacco Use   Smoking status: Never   Smokeless tobacco: Never  Vaping Use   Vaping Use: Never used  Substance and Sexual Activity   Alcohol use: Yes    Alcohol/week: 7.0 standard drinks of alcohol    Types: 7 Glasses of wine per week   Drug use: Never   Sexual activity: Never  Other Topics Concern   Not on file  Social History Narrative   Lives alone but next to her daughter. She is walking 3-5 miles per day. She likes to read, cook and do yard work.   Social Determinants of Health   Financial Resource Strain: Low Risk  (10/19/2022)   Overall Financial Resource Strain (CARDIA)    Difficulty of Paying Living Expenses: Not  hard at all  Food Insecurity: No Food Insecurity (10/19/2022)   Hunger Vital Sign    Worried About Running Out of Food in the Last Year: Never true    Ran Out of Food in the Last Year: Never true  Transportation Needs: No Transportation Needs (10/19/2022)   PRAPARE - Hydrologist (Medical): No    Lack of Transportation (Non-Medical): No  Physical Activity: Sufficiently Active (10/19/2022)   Exercise Vital Sign    Days of Exercise per Week: 5 days    Minutes of Exercise per Session:  90 min  Stress: No Stress Concern Present (10/19/2022)   [place]    Feeling of Stress : Only a little  Social Connections: Moderately Integrated (10/19/2022)   Social Connection and Isolation Panel [NHANES]    Frequency of Communication with Friends and Family: More than three times a week    Frequency of Social Gatherings with Friends and Family: More than three times a week    Attends Religious Services: More than 4 times per year    Active Member of [employer] or Organizations: Yes    Attends Archivist Meetings: More than 4 times per year    Marital Status: Widowed  Intimate Partner Violence: Not At Risk (10/19/2022)   Humiliation, Afraid, Rape, and Kick questionnaire    Fear of Current or Ex-Partner: No    Emotionally Abused: No    Physically Abused: No    Sexually Abused: No    Family History  Problem Relation Age of Onset   Breast cancer Mother 48   Stroke Mother    Hypertension Father    Heart attack Father    Diabetes Sister    Breast cancer Paternal Aunt    Breast cancer Paternal Grandmother        dx 7s   Rheum arthritis Daughter     ROS- All systems are reviewed and negative except as per the HPI above  Physical Exam: Vitals:   02/06/23 1020  BP: 116/80  Pulse: (!) 53  Weight: 67.3 kg  Height: 5' 9.5" (1.765 m)   Wt Readings from Last 3 Encounters:  02/06/23 67.3 kg  01/22/23  66.5 kg  01/17/23 65.3 kg    Labs: Lab Results  Component Value Date   NA 135 01/17/2023   K 3.8 01/17/2023   CL 103 01/17/2023   CO2 22 01/17/2023   GLUCOSE 107 (H) 01/17/2023   BUN 14 01/17/2023   CREATININE 0.59 01/17/2023   CALCIUM 9.4 01/17/2023   MG 1.9 01/17/2023   No results found for: "INR" Lab Results  Component Value Date   CHOL 269 (H) 05/09/2021   HDL 132 05/09/2021   LDLCALC 125 (H) 05/09/2021   TRIG 39 05/09/2021     GEN- The patient is well appearing, alert and oriented x 3 today.   Head- normocephalic, atraumatic Eyes-  Sclera clear, conjunctiva pink Ears- hearing intact Oropharynx- clear Neck- supple, no JVP Lymph- no cervical lymphadenopathy Lungs- Clear to ausculation bilaterally, normal work of breathing Heart- regular rate and rhythm, no murmurs, rubs or gallops, PMI not laterally displaced GI- soft, NT, ND, + BS Extremities- no clubbing, cyanosis, or edema MS- no significant deformity or atrophy Skin- no rash or lesion Psych- euthymic mood, full affect Neuro- strength and sensation are intact  EKG- Vent. rate 53 BPM PR interval 188 ms QRS duration 90 ms QT/QTcB 458/429 ms P-R-T axes 72 50 61 Sinus bradycardia Otherwise normal ECG When compared with ECG of 22-Jan-2023 11:15, PREVIOUS ECG IS PRESENT  Assessment and Plan:  1. New onset afib No apparent trigger Self converted after last visit and remains in SR General education re afib and triggers  Reduce diltiazem to 120 mg daily Given rx for PIP 30 mg Cardizem  Echo ordered Seep study pending  Avoid alcohol   2. CHA2DS2VASc score of 2 Continue eliquis 5 mg bid, if echo ok may not need long term by guidelines    F/u with Dr. Quentin Ore as scheduled 3/20  Donna Ramsey  Donna Ramsey, [facility] Donna Ramsey Donna Ramsey, Donna Ramsey [address] [phone]

## 2023-02-07 ENCOUNTER — Other Ambulatory Visit (HOSPITAL_COMMUNITY): Payer: Self-pay

## 2023-02-18 ENCOUNTER — Other Ambulatory Visit (HOSPITAL_COMMUNITY): Payer: Self-pay

## 2023-02-18 ENCOUNTER — Ambulatory Visit (INDEPENDENT_AMBULATORY_CARE_PROVIDER_SITE_OTHER): Payer: Medicare Other | Admitting: Family Medicine

## 2023-02-18 ENCOUNTER — Encounter: Payer: Self-pay | Admitting: Family Medicine

## 2023-02-18 VITALS — BP 110/78 | HR 74 | Ht 69.0 in | Wt 147.0 lb

## 2023-02-18 DIAGNOSIS — G4700 Insomnia, unspecified: Secondary | ICD-10-CM

## 2023-02-18 DIAGNOSIS — I480 Paroxysmal atrial fibrillation: Secondary | ICD-10-CM | POA: Insufficient documentation

## 2023-02-18 MED ORDER — ALPRAZOLAM 0.25 MG PO TABS
0.2500 mg | ORAL_TABLET | Freq: Every evening | ORAL | 1 refills | Status: DC | PRN
  Filled 2023-02-18: qty 30, 30d supply, fill #0

## 2023-02-18 MED ORDER — ALPRAZOLAM 0.25 MG PO TABS: 0.2500 mg | ORAL_TABLET | Freq: Every evening | ORAL | 1 refills | Status: DC | PRN

## 2023-02-18 NOTE — Progress Notes (Signed)
Donna Ramsey - 72 y.o. female MRN [id number]  Date of birth: 03/25/1951  Subjective Chief Complaint  Patient presents with   Follow-up    A-fib    HPI Donna Ramsey is a 72 y.o. female here today for here today for follow-up of atrial fibrillation.  Recent diagnosis of A-fib.  Her Apple Watch alerted her to elevated heart rates in the 150s.  EKG in the ED did confirm presence of atrial fibrillation.  She was started on diltiazem and Eliquis was added due to CHA2DS2-VASc score of 2.  She has had visit to atrial fibrillation clinic.  She remains pretty active denies any changes to exercise tolerance.  She has not had increased palpitations, dyspnea or fatigue since adding medication.  She has had some pain along the right anterior chest wall.  She is unsure if this is related to exercise or possibly some anxiety.  She does admit to increased anxiety since her diagnosis and has had some difficulty with sleep.  She has used alprazolam in the past to help with sleep and would like to add this back on for short period of time.  ROS:  A comprehensive ROS was completed and negative except as noted per HPI   Allergies  Allergen Reactions   Clindamycin Hives   Codeine Hives and Rash   Pentazocine Hives   Sulfamethoxazole-Trimethoprim     Other reaction(s): Rash with itching (itching rash, pruritus) 2017 Reaction: rash, itching 03/16/2021: faint rash and itching 30 min after bactrim 400/80 oral challenge.    Doxycycline Other (See Comments)    Itching without rash near end of course Itching without rash near end of course    Levofloxacin Rash and Other (See Comments)    tendonitis    Neomycin Itching   Quinolones Other (See Comments)    Achilles Pain   Sulfa Antibiotics Rash    Past Medical History:  Diagnosis Date   Asplenia    Family history of breast cancer    GERD (gastroesophageal reflux disease)    Liver disease, unspecified    heterogenous echotexture   Portal  hypertension ([place])    Splenic infarct     Past Surgical History:  Procedure Laterality Date   ABDOMINAL HYSTERECTOMY     BREAST BIOPSY Bilateral 50 yrs ago   benign   CHONDROPLASTY Left 07/28/2020   Procedure: CHONDROPLASTY LEFT KNEE;  Surgeon: Hiram Gash, MD;  Location: [facility];  Service: Orthopedics;  Laterality: Left;   HERNIA REPAIR     INGUINAL HERNIA REPAIR Right 05/10/2021   Procedure: RIGHT INGUINAL HERNIA REPAIR WITH MESH;  Surgeon: Coralie Keens, MD;  Location: [facility];  Service: General;  Laterality: Right;   KNEE ARTHROSCOPY WITH LATERAL MENISECTOMY Left 07/28/2020   Procedure: LEFT KNEE ARTHROSCOPY WITH LATERAL MENISECTOMY;  Surgeon: Hiram Gash, MD;  Location: [facility];  Service: Orthopedics;  Laterality: Left;   KNEE ARTHROSCOPY WITH MEDIAL MENISECTOMY Left 07/28/2020   Procedure: LEFT KNEE ARTHROSCOPY WITH MEDIAL MENISECTOMY;  Surgeon: Hiram Gash, MD;  Location: [facility];  Service: Orthopedics;  Laterality: Left;   SPLENECTOMY, TOTAL      Social History   Socioeconomic History   Marital status: Widowed    Spouse name: Not on file   Number of children: 3   Years of education: 16   Highest education level: Bachelor's degree (e.g., BA, AB, BS)  Occupational History    Comment: Retired Software engineer  Tobacco Use  Smoking status: Never   Smokeless tobacco: Never  Vaping Use   Vaping Use: Never used  Substance and Sexual Activity   Alcohol use: Yes    Alcohol/week: 7.0 standard drinks of alcohol    Types: 7 Glasses of wine per week   Drug use: Never   Sexual activity: Never  Other Topics Concern   Not on file  Social History Narrative   Lives alone but next to her daughter. She is walking 3-5 miles per day. She likes to read, cook and do yard work.   Social Determinants of Health   Financial Resource Strain: Low Risk  (10/19/2022)   Overall Financial Resource Strain (CARDIA)    Difficulty of Paying  Living Expenses: Not hard at all  Food Insecurity: No Food Insecurity (10/19/2022)   Hunger Vital Sign    Worried About Running Out of Food in the Last Year: Never true    Ran Out of Food in the Last Year: Never true  Transportation Needs: No Transportation Needs (10/19/2022)   PRAPARE - Hydrologist (Medical): No    Lack of Transportation (Non-Medical): No  Physical Activity: Sufficiently Active (10/19/2022)   Exercise Vital Sign    Days of Exercise per Week: 5 days    Minutes of Exercise per Session: 90 min  Stress: No Stress Concern Present (10/19/2022)   [place]    Feeling of Stress : Only a little  Social Connections: Moderately Integrated (10/19/2022)   Social Connection and Isolation Panel [NHANES]    Frequency of Communication with Friends and Family: More than three times a week    Frequency of Social Gatherings with Friends and Family: More than three times a week    Attends Religious Services: More than 4 times per year    Active Member of [employer] or Organizations: Yes    Attends Archivist Meetings: More than 4 times per year    Marital Status: Widowed    Family History  Problem Relation Age of Onset   Breast cancer Mother 33   Stroke Mother    Hypertension Father    Heart attack Father    Diabetes Sister    Breast cancer Paternal Aunt    Breast cancer Paternal Grandmother        dx 16s   Rheum arthritis Daughter     Health Maintenance  Topic Date Due   Meningococcal B Vaccine (1 of 4 - Increased Risk) Never done   DTaP/Tdap/Td (3 - Td or Tdap) 03/29/2021   COVID-19 Vaccine (4 - 2023-24 season) 08/17/2022   Pneumonia Vaccine 21+ Years old (55 of 3 - PPSV23 or PCV20) 10/20/2023 (Originally 01/15/2019)   COLONOSCOPY (Pts 45-15yr Insurance coverage will need to be confirmed)  04/12/2023   Medicare Annual Wellness (AWV)  11/15/2023   MAMMOGRAM  07/19/2024    INFLUENZA VACCINE  Completed   DEXA SCAN  Completed   Hepatitis C Screening  Completed   Zoster Vaccines- Shingrix  Completed   HPV VACCINES  Aged Out     ----------------------------------------------------------------------------------------------------------------------------------------------------------------------------------------------------------------- Physical Exam There were no vitals taken for this visit.  Physical Exam Constitutional:      Appearance: Normal appearance.  Eyes:     General: No scleral icterus. Cardiovascular:     Rate and Rhythm: Normal rate and regular rhythm.  Pulmonary:     Effort: Pulmonary effort is normal.     Breath sounds: Normal breath sounds.  Musculoskeletal:     Cervical back: Neck supple.  Neurological:     Mental Status: She is alert.  Psychiatric:        Mood and Affect: Mood normal.        Behavior: Behavior normal.     ------------------------------------------------------------------------------------------------------------------------------------------------------------------------------------------------------------------- Assessment and Plan  Paroxysmal atrial fibrillation (HCC) Rate control with diltiazem at current strength.  This was recently reduced at A-fib clinic.  Remained stable at this time.  She does have upcoming visit with electrophysiology as well.  Recommend continuation of current medication until this visit.  Continue anticoagulation with Eliquis due to elevated CHA2DS2-VASc score.  Insomnia Increased insomnia due to anxiety regarding new diagnosis of atrial fibrillation.  Adding alprazolam low strength back on temporarily to use as needed for sleep.   Meds ordered this encounter  Medications   DISCONTD: ALPRAZolam (XANAX) 0.25 MG tablet    Sig: Take 1 tablet (0.25 mg total) by mouth at bedtime as needed for anxiety.    Dispense:  30 tablet    Refill:  1   ALPRAZolam (XANAX) 0.25 MG tablet    Sig:  Take 1 tablet (0.25 mg total) by mouth at bedtime as needed for anxiety.    Dispense:  30 tablet    Refill:  1    No follow-ups on file.    This visit occurred during the SARS-CoV-2 public health emergency.  Safety protocols were in place, including screening questions prior to the visit, additional usage of staff PPE, and extensive cleaning of exam room while observing appropriate contact time as indicated for disinfecting solutions.

## 2023-02-18 NOTE — Assessment & Plan Note (Signed)
Rate control with diltiazem at current strength.  This was recently reduced at A-fib clinic.  Remained stable at this time.  She does have upcoming visit with electrophysiology as well.  Recommend continuation of current medication until this visit.  Continue anticoagulation with Eliquis due to elevated CHA2DS2-VASc score.

## 2023-02-18 NOTE — Assessment & Plan Note (Signed)
Increased insomnia due to anxiety regarding new diagnosis of atrial fibrillation.  Adding alprazolam low strength back on temporarily to use as needed for sleep.

## 2023-02-26 ENCOUNTER — Telehealth: Payer: Self-pay | Admitting: *Deleted

## 2023-02-26 DIAGNOSIS — M25562 Pain in left knee: Secondary | ICD-10-CM | POA: Diagnosis not present

## 2023-02-26 NOTE — Telephone Encounter (Signed)
.    Name: Donna Ramsey  DOB: Aug 07, 1951  MRN: [id number]  Primary Cardiologist: None  Chart reviewed as part of pre-operative protocol coverage. The patient has an upcoming visit scheduled with Dr. Quentin Ore for consultation on 03/06/2023 at which time clearance can be addressed in case there are any issues that would impact surgical recommendations.   I added preop FYI to appointment note so that provider is aware to address at time of outpatient visit.  Per office protocol the cardiology provider should forward their finalized clearance decision and recommendations regarding antiplatelet therapy to the requesting party below.    Per office protocol, patient can hold Eliquis for 3 days prior to procedure.     I will route this message as FYI to requesting party and remove this message from the preop box as separate preop APP input not needed at this time.   Please call with any questions.  Mable Fill, Marissa Nestle, NP  02/26/2023, 1:34 PM

## 2023-02-26 NOTE — Telephone Encounter (Signed)
Patient with diagnosis of afib on Eliquis for anticoagulation.    Procedure: left total knee replacement Date of procedure: TBD  CHA2DS2-VASc Score = 2  This indicates a 2.2% annual risk of stroke. The patient's score is based upon: CHF History: 0 HTN History: 0 Diabetes History: 0 Stroke History: 0 Vascular Disease History: 0 Age Score: 1 Gender Score: 1      CrCl 19m/min Platelet count 283K  Per office protocol, patient can hold Eliquis for 3 days prior to procedure.    **This guidance is not considered finalized until pre-operative APP has relayed final recommendations.**

## 2023-02-26 NOTE — Telephone Encounter (Signed)
   Pre-operative Risk Assessment    Patient Name: Donna Ramsey  DOB: 1951/09/09 MRN: [id number]      Request for Surgical Clearance    Procedure:   LEFT TOTAL KNEE REPLACEMENT   Date of Surgery:  Clearance TBD                                 Surgeon:  DR. DANIEL MARCHWIANY Surgeon's Group or Practice Name:  [facility] Phone number:  [phone] EXT [phone] ATTN: [place] Fax number:  [phone]   Type of Clearance Requested:   - Medical  - Pharmacy:  Hold Apixaban (Eliquis)     Type of Anesthesia:  Spinal   Additional requests/questions:    Donna Ramsey   02/26/2023, 11:47 AM

## 2023-02-26 NOTE — Telephone Encounter (Signed)
Pharmacy please advise on holding Eliquis prior to left total knee hip replacement scheduled for TBD. Thank you.

## 2023-02-28 ENCOUNTER — Ambulatory Visit (HOSPITAL_COMMUNITY): Payer: Medicare Other

## 2023-03-04 ENCOUNTER — Ambulatory Visit (HOSPITAL_COMMUNITY)
Admission: RE | Admit: 2023-03-04 | Discharge: 2023-03-04 | Disposition: A | Discharge status: Home / Self Care | Payer: Medicare Other | Source: Ambulatory Visit | Attending: Nurse Practitioner | Admitting: Nurse Practitioner

## 2023-03-04 ENCOUNTER — Other Ambulatory Visit (HOSPITAL_COMMUNITY): Payer: Self-pay

## 2023-03-04 DIAGNOSIS — I071 Rheumatic tricuspid insufficiency: Secondary | ICD-10-CM | POA: Diagnosis not present

## 2023-03-04 DIAGNOSIS — I4891 Unspecified atrial fibrillation: Secondary | ICD-10-CM | POA: Diagnosis not present

## 2023-03-04 DIAGNOSIS — K080 Exfoliation of teeth due to systemic causes: Secondary | ICD-10-CM | POA: Diagnosis not present

## 2023-03-04 DIAGNOSIS — K219 Gastro-esophageal reflux disease without esophagitis: Secondary | ICD-10-CM | POA: Insufficient documentation

## 2023-03-04 DIAGNOSIS — I10 Essential (primary) hypertension: Secondary | ICD-10-CM | POA: Diagnosis not present

## 2023-03-04 NOTE — Progress Notes (Signed)
Echocardiogram 2D Echocardiogram has been performed.  Donna Ramsey 03/04/2023, 10:09 AM

## 2023-03-05 NOTE — Progress Notes (Unsigned)
Electrophysiology Office Note:    Date:  03/06/2023   ID:  Donna Ramsey, DOB 10-23-1951, MRN [id number]  [facility] Cardiologist:  None  [facility] Electrophysiologist:  Vickie Epley, MD   Referring MD: Sherran Needs, NP   Chief Complaint: AF  History of Present Illness:    Donna Ramsey is a 72 y.o. female who I am seeing today for an evaluation of AF at the request of Donna Ramsey. She saw Butch Penny 02/06/2023.  Her medical hx includes abnormal LFTs, GERD, portal HTN.   She has experienced palpitations in the past. Apple watch has detected AF. She takes eliquis for stroke ppx.   She is overall doing well.  She is very active and is only limited because of left knee pain.  She is planning to have orthopedic surgery/knee replacement.  She is tolerating the Eliquis without bleeding issues but she does have a strong desire to avoid long-term exposure anticoagulation.  She is a retired Software engineer.        Their past medical, social and family history was reveiwed.   ROS:   Please see the history of present illness.    All other systems reviewed and are negative.  EKGs/Labs/Other Studies Reviewed:    The following studies were reviewed today:  03/04/2023 Echo ?ASD EF60 RV normal No significant valve abnormalities  01/21/2023 ECG shows AF w/ RVR 02/06/2023 ECG shows sinus, normal PR and QRS duration. QTc 421ms.    Physical Exam:    VS:  BP 102/66   Pulse 69   Ht 5' 9.5" (1.765 m)   Wt 148 lb 9.6 oz (67.4 kg)   SpO2 99%   BMI 21.63 kg/m     Wt Readings from Last 3 Encounters:  03/06/23 148 lb 9.6 oz (67.4 kg)  02/19/23 147 lb (66.7 kg)  02/06/23 148 lb 6.4 oz (67.3 kg)     GEN:  Well nourished, well developed in no acute distress CARDIAC: RRR, no murmurs, rubs, gallops RESPIRATORY:  Clear to auscultation without rales, wheezing or rhonchi       ASSESSMENT AND PLAN:    1. Paroxysmal atrial fibrillation (HCC)      #pAF Symptomatic. Continue eliquis.  Discussed treatment options today for their AF including antiarrhythmic drug therapy and ablation. Discussed risks, recovery and likelihood of success. Discussed potential need for repeat ablation procedures and antiarrhythmic drugs after an initial ablation.   Risk, benefits, and alternatives to EP study and radiofrequency ablation for afib were also discussed in detail today. These risks include but are not limited to stroke, bleeding, vascular damage, tamponade, perforation, damage to the esophagus, lungs, and other structures, pulmonary vein stenosis, worsening renal function, and death. The patient understands these risks.  She is planning to have orthopedic surgery in the coming months to replace her left knee.  We will plan to touch base about 6 months from now to review her A-fib burden from her Apple Watch.  We will consider catheter ablation +/- watchman at that follow-up appointment.  #?ASD  #Preoperative risk eval She reports no ischemic or heart failure symptoms today.  I think she is at acceptable risk to undergo orthopedic surgery.  She should continue her calcium channel blocker in the perioperative period.  It is okay for her to hold her anticoagulant for 2 to 3 days prior to the procedure and restart when felt safe from a surgical perspective.  Follow-up 6 months with me.  Signed, Hilton Cork. Quentin Ore, MD, [facility], [facility] 03/06/2023 2:52 PM    Electrophysiology [facility]

## 2023-03-06 ENCOUNTER — Ambulatory Visit: Payer: Medicare Other | Attending: Cardiology | Admitting: Cardiology

## 2023-03-06 ENCOUNTER — Encounter: Payer: Self-pay | Admitting: Cardiology

## 2023-03-06 VITALS — BP 102/66 | HR 69 | Ht 69.5 in | Wt 148.6 lb

## 2023-03-06 DIAGNOSIS — I480 Paroxysmal atrial fibrillation: Secondary | ICD-10-CM | POA: Diagnosis not present

## 2023-03-06 LAB — ECHOCARDIOGRAM COMPLETE
Area-P 1/2: 3.12 cm2
Calc EF: 63.7 %
S' Lateral: 3.5 cm
Single Plane A2C EF: 68 %
Single Plane A4C EF: 58 %

## 2023-03-06 NOTE — Patient Instructions (Signed)
Medication Instructions:  Your physician recommends that you continue on your current medications as directed. Please refer to the Current Medication list given to you today.  *If you need a refill on your cardiac medications before your next appointment, please call your pharmacy*  Follow-Up: At [facility], you and your health needs are our priority.  As part of our continuing mission to provide you with exceptional heart care, we have created designated Provider Care Teams.  These Care Teams include your primary Cardiologist (physician) and Advanced Practice Providers (APPs -  Physician Assistants and Nurse Practitioners) who all work together to provide you with the care you need, when you need it.  Your next appointment:   6 month(s)  Provider:   Cameron Lambert, MD    

## 2023-03-07 ENCOUNTER — Encounter: Payer: Self-pay | Admitting: Cardiology

## 2023-03-08 MED ORDER — APIXABAN 5 MG PO TABS: 5.0000 mg | ORAL_TABLET | Freq: Two times a day (BID) | ORAL | 3 refills | Status: DC

## 2023-03-08 MED ORDER — DILTIAZEM HCL ER COATED BEADS 120 MG PO CP24: 120.0000 mg | ORAL_CAPSULE | Freq: Every day | ORAL | 3 refills | Status: DC

## 2023-04-01 ENCOUNTER — Encounter: Payer: Self-pay | Admitting: *Deleted

## 2023-04-03 DIAGNOSIS — D472 Monoclonal gammopathy: Secondary | ICD-10-CM | POA: Diagnosis not present

## 2023-04-04 DIAGNOSIS — M1712 Unilateral primary osteoarthritis, left knee: Secondary | ICD-10-CM | POA: Diagnosis not present

## 2023-04-11 DIAGNOSIS — D704 Cyclic neutropenia: Secondary | ICD-10-CM | POA: Diagnosis not present

## 2023-04-11 DIAGNOSIS — D472 Monoclonal gammopathy: Secondary | ICD-10-CM | POA: Diagnosis not present

## 2023-04-15 ENCOUNTER — Ambulatory Visit (HOSPITAL_BASED_OUTPATIENT_CLINIC_OR_DEPARTMENT_OTHER): Payer: Medicare Other | Attending: Nurse Practitioner | Admitting: Cardiology

## 2023-04-15 VITALS — Ht 70.0 in | Wt 150.0 lb

## 2023-04-15 DIAGNOSIS — G4733 Obstructive sleep apnea (adult) (pediatric): Secondary | ICD-10-CM | POA: Insufficient documentation

## 2023-04-15 DIAGNOSIS — I4891 Unspecified atrial fibrillation: Secondary | ICD-10-CM | POA: Diagnosis not present

## 2023-04-17 DIAGNOSIS — H524 Presbyopia: Secondary | ICD-10-CM | POA: Diagnosis not present

## 2023-04-25 NOTE — Procedures (Signed)
   Patient Name: Donna Ramsey, Donna Ramsey Date: 04/15/2023 Gender: Female D.O.B: [birth year] Age (years): 71 Referring Provider: Newman Nip Height (inches): 70 Interpreting Physician: Armanda Magic MD, ABSM Weight (lbs): 150 RPSGT: Ulyess Mort BMI: 22 MRN: [id number] Neck Size: 13.00  CLINICAL INFORMATION The patient is referred for a split night study with BPAP.  MEDICATIONS Medications self-administered by patient taken the night of the study : AFRIN, eliquis, GLYCINE, MAGNESIUM, N-ACETYLCYSTEINE  SLEEP STUDY TECHNIQUE As per the AASM Manual for the Scoring of Sleep and Associated Events v2.3 (April 2016) with a hypopnea requiring 4% desaturations.  The channels recorded and monitored were frontal, central and occipital EEG, electrooculogram (EOG), submentalis EMG (chin), nasal and oral airflow, thoracic and abdominal wall motion, anterior tibialis EMG, snore microphone, electrocardiogram, and pulse oximetry. Bi-level positive airway pressure (BiPAP) was initiated when the patient met split night criteria and was titrated according to treat sleep-disordered breathing.  RESPIRATORY PARAMETERS Diagnostic Total AHI (/hr): 13.6  RDI (/hr):31.5  OA Index (/hr):0.4  CA Index (/hr): 0.0 REM AHI (/hr):45.0  NREM AHI (/hr):10.7  Supine AHI (/hr):37.1  Non-supine AHI (/hr):14.90 Min O2 Sat (%):86.0  Mean O2 (%): 93.9  Time below 88% (min):0.7   Titration Optimal IPAP Pressure (cm): 18  Optimal EPAP Pressure (cm):14  AHI at Optimal Pressure (/hr):141.8  Min O2 at Optimal Pressure (%):96.0 Sleep % at Optimal (%):18  Supine % at Optimal (%):0   SLEEP ARCHITECTURE The study was initiated at 10:05:48 PM and terminated at 5:08:09 AM. The total recorded time was 422.3 minutes. EEG confirmed total sleep time was 247.5 minutes yielding a sleep efficiency of 58.6%. Sleep onset after lights out was 24.2 minutes with a REM latency of 108.0 minutes. The patient spent 30.5% of the night  in stage N1 sleep, 48.5% in stage N2 sleep, 0.0% in stage N3 and 21% in REM. Wake after sleep onset (WASO) was 150.6 minutes. The Arousal Index was 59.9/hour.  LEG MOVEMENT DATA The total Periodic Limb Movements of Sleep (PLMS) were 0. The PLMS index was 0.0 .  CARDIAC DATA The 2 lead EKG demonstrated sinus rhythm. The mean heart rate was 100.0 beats per minute. Other EKG findings include: PACs. IMPRESSIONS - Mild obstructive sleep apnea occurred during the diagnostic portion of the study (AHI = 13.6 /hour). An optimal PAP pressure was selected for this patient ( 18 / cm of water) - Mild central sleep apnea occurred during the diagnostic portion of the study (CAI = 0.0/hour). - Mild oxygen desaturation was noted during the diagnostic portion of the study (Min O2 = 86.0%). - The patient snored with soft snoring volume during the diagnostic portion of the study. - PACs were noted during this study. - Clinically significant periodic limb movements of sleep did not occur during the study.  DIAGNOSIS - Obstructive Sleep Apnea (G47.33)  RECOMMENDATIONS - Recommend full night CPAP titration.  May need BiPAP. - Avoid alcohol, sedatives and other CNS depressants that may worsen sleep apnea and disrupt normal sleep architecture. - Sleep hygiene should be reviewed to assess factors that may improve sleep quality. - Weight management and regular exercise should be initiated or continued.  [Electronically signed] 04/25/2023 08:00 AM  Armanda Magic MD, ABSM Diplomate, American Board of Sleep Medicine

## 2023-04-29 ENCOUNTER — Encounter: Payer: Self-pay | Admitting: Cardiology

## 2023-04-30 DIAGNOSIS — K080 Exfoliation of teeth due to systemic causes: Secondary | ICD-10-CM | POA: Diagnosis not present

## 2023-04-30 DIAGNOSIS — K766 Portal hypertension: Secondary | ICD-10-CM | POA: Diagnosis not present

## 2023-05-01 ENCOUNTER — Telehealth: Payer: Self-pay | Admitting: *Deleted

## 2023-05-01 DIAGNOSIS — I4891 Unspecified atrial fibrillation: Secondary | ICD-10-CM

## 2023-05-01 DIAGNOSIS — G4733 Obstructive sleep apnea (adult) (pediatric): Secondary | ICD-10-CM

## 2023-05-01 NOTE — Telephone Encounter (Signed)
The patient has been notified of the result and verbalized understanding.  All questions (if any) were answered. Latrelle Dodrill, CMA 05/01/2023 6:29 PM    Will precert titration

## 2023-05-06 NOTE — Progress Notes (Signed)
Sent message, via epic in basket, requesting orders in epic from surgeon.  

## 2023-05-07 DIAGNOSIS — M1712 Unilateral primary osteoarthritis, left knee: Secondary | ICD-10-CM | POA: Diagnosis not present

## 2023-05-07 NOTE — Progress Notes (Addendum)
Anesthesia Review:  PCP: Everrett Coombe LOV 02/18/23 on chart  clearance 03/01/23 on chart  Cardiologist : Steffanie Dunn- LOV 03/06/23 - Clearance on chart dated 03/06/23  Called and requested clearances from [place] at office.  On 05/14/23.  Chest x-ray : 1v- 01/17/23  EKG : 02/06/23  Echo : 03/06/2023  CT Card- 11/09/22  Stress test: Cardiac Cath :  Activity level: can do a flight of stairs without difficutly  Sleep Study/ CPAP : mild sleep apnea no cpap  Fasting Blood Sugar :      / Checks Blood Sugar -- times a day:   Blood Thinner/ Instructions /Last Dose: ASA / Instructions/ Last Dose :    Eliquis - stop 05/24/2023 per pt

## 2023-05-08 NOTE — Patient Instructions (Addendum)
SURGICAL WAITING ROOM VISITATION  Patients having surgery or a procedure may have no more than 2 support people in the waiting area - these visitors may rotate.    Children under the age of 41 must have an adult with them who is not the patient.  Due to an increase in RSV and influenza rates and associated hospitalizations, children ages 62 and under may not visit patients in [facility] hospitals.  If the patient needs to stay at the hospital during part of their recovery, the visitor guidelines for inpatient rooms apply. Pre-op nurse will coordinate an appropriate time for 1 support person to accompany patient in pre-op.  This support person may not rotate.    Please refer to the [facility] website for the visitor guidelines for Inpatients (after your surgery is over and you are in a regular room).       Your procedure is scheduled on:  05/27/2023    Report to [facility] Main Entrance    Report to admitting at   [phone]   Call this number if you have problems the morning of surgery [phone]   Do not eat food :After Midnight.   After Midnight you may have the following liquids until ___ 0430___ AM  DAY OF SURGERY  Water Non-Citrus Juices (without pulp, NO RED-Apple, White grape, White cranberry) Black Coffee (NO MILK/CREAM OR CREAMERS, sugar ok)  Clear Tea (NO MILK/CREAM OR CREAMERS, sugar ok) regular and decaf                             Plain Jell-O (NO RED)                                           Fruit ices (not with fruit pulp, NO RED)                                     Popsicles (NO RED)                                                               Sports drinks like Gatorade (NO RED)              Drink 2 Ensure/G2 drinks AT 10:00 PM the night before surgery.        The day of surgery:  Drink ONE (1) Pre-Surgery Clear Ensure or G2 at 0430 AM  ( have completed by ) the morning of surgery. Drink in one sitting. Do not sip.  This drink was given to you  during your hospital  pre-op appointment visit. Nothing else to drink after completing the  Pre-Surgery Clear Ensure or G2.          If you have questions, please contact your surgeon's office.       Oral Hygiene is also important to reduce your risk of infection.  Remember - BRUSH YOUR TEETH THE MORNING OF SURGERY WITH YOUR REGULAR TOOTHPASTE  DENTURES WILL BE REMOVED PRIOR TO SURGERY PLEASE DO NOT APPLY "Poly grip" OR ADHESIVES!!!   Do NOT smoke after Midnight   Take these medicines the morning of surgery with A SIP OF WATER:  Cardizem   DO NOT TAKE ANY ORAL DIABETIC MEDICATIONS DAY OF YOUR SURGERY  Bring CPAP mask and tubing day of surgery.                              You may not have any metal on your body including hair pins, jewelry, and body piercing             Do not wear make-up, lotions, powders, perfumes/cologne, or deodorant  Do not wear nail polish including gel and S&S, artificial/acrylic nails, or any other type of covering on natural nails including finger and toenails. If you have artificial nails, gel coating, etc. that needs to be removed by a nail salon please have this removed prior to surgery or surgery may need to be canceled/ delayed if the surgeon/ anesthesia feels like they are unable to be safely monitored.   Do not shave  48 hours prior to surgery.               Men may shave face and neck.   Do not bring valuables to the hospital. [facility] IS NOT             RESPONSIBLE   FOR VALUABLES.   Contacts, glasses, dentures or bridgework may not be worn into surgery.   Bring small overnight bag day of surgery.   DO NOT BRING YOUR HOME MEDICATIONS TO THE HOSPITAL. PHARMACY WILL DISPENSE MEDICATIONS LISTED ON YOUR MEDICATION LIST TO YOU DURING YOUR ADMISSION IN THE HOSPITAL!    Patients discharged on the day of surgery will not be allowed to drive home.  Someone NEEDS to stay with you for the first 24 hours after  anesthesia.   Special Instructions: Bring a copy of your healthcare power of attorney and living will documents the day of surgery if you haven't scanned them before.              Please read over the following fact sheets you were given: IF YOU HAVE QUESTIONS ABOUT YOUR PRE-OP INSTRUCTIONS PLEASE CALL [phone]   If you received a COVID test during your pre-op visit  it is requested that you wear a mask when out in public, stay away from anyone that may not be feeling well and notify your surgeon if you develop symptoms. If you test positive for Covid or have been in contact with anyone that has tested positive in the last 10 days please notify you surgeon.      Pre-operative 5 CHG Bath Instructions   You can play a key role in reducing the risk of infection after surgery. Your skin needs to be as free of germs as possible. You can reduce the number of germs on your skin by washing with CHG (chlorhexidine gluconate) soap before surgery. CHG is an antiseptic soap that kills germs and continues to kill germs even after washing.   DO NOT use if you have an allergy to chlorhexidine/CHG or antibacterial soaps. If your skin becomes reddened or irritated, stop using the CHG and notify one of our RNs at [phone].   Please shower with the CHG soap starting 4 days  before surgery using the following schedule:     Please keep in mind the following:  DO NOT shave, including legs and underarms, starting the day of your first shower.   You may shave your face at any point before/day of surgery.  Place clean sheets on your bed the day you start using CHG soap. Use a clean washcloth (not used since being washed) for each shower. DO NOT sleep with pets once you start using the CHG.   CHG Shower Instructions:  If you choose to wash your hair and private area, wash first with your normal shampoo/soap.  After you use shampoo/soap, rinse your hair and body thoroughly to remove shampoo/soap residue.   Turn the water OFF and apply about 3 tablespoons (45 ml) of CHG soap to a CLEAN washcloth.  Apply CHG soap ONLY FROM YOUR NECK DOWN TO YOUR TOES (washing for 3-5 minutes)  DO NOT use CHG soap on face, private areas, open wounds, or sores.  Pay special attention to the area where your surgery is being performed.  If you are having back surgery, having someone wash your back for you may be helpful. Wait 2 minutes after CHG soap is applied, then you may rinse off the CHG soap.  Pat dry with a clean towel  Put on clean clothes/pajamas   If you choose to wear lotion, please use ONLY the CHG-compatible lotions on the back of this paper.     Additional instructions for the day of surgery: DO NOT APPLY any lotions, deodorants, cologne, or perfumes.   Put on clean/comfortable clothes.  Brush your teeth.  Ask your nurse before applying any prescription medications to the skin.      CHG Compatible Lotions   Aveeno Moisturizing lotion  Cetaphil Moisturizing Cream  Cetaphil Moisturizing Lotion  Clairol Herbal Essence Moisturizing Lotion, Dry Skin  Clairol Herbal Essence Moisturizing Lotion, Extra Dry Skin  Clairol Herbal Essence Moisturizing Lotion, Normal Skin  Curel Age Defying Therapeutic Moisturizing Lotion with Alpha Hydroxy  Curel Extreme Care Body Lotion  Curel Soothing Hands Moisturizing Hand Lotion  Curel Therapeutic Moisturizing Cream, Fragrance-Free  Curel Therapeutic Moisturizing Lotion, Fragrance-Free  Curel Therapeutic Moisturizing Lotion, Original Formula  Eucerin Daily Replenishing Lotion  Eucerin Dry Skin Therapy Plus Alpha Hydroxy Crme  Eucerin Dry Skin Therapy Plus Alpha Hydroxy Lotion  Eucerin Original Crme  Eucerin Original Lotion  Eucerin Plus Crme Eucerin Plus Lotion  Eucerin TriLipid Replenishing Lotion  Keri Anti-Bacterial Hand Lotion  Keri Deep Conditioning Original Lotion Dry Skin Formula Softly Scented  Keri Deep Conditioning Original Lotion, Fragrance  Free Sensitive Skin Formula  Keri Lotion Fast Absorbing Fragrance Free Sensitive Skin Formula  Keri Lotion Fast Absorbing Softly Scented Dry Skin Formula  Keri Original Lotion  Keri Skin Renewal Lotion Keri Silky Smooth Lotion  Keri Silky Smooth Sensitive Skin Lotion  Nivea Body Creamy Conditioning Oil  Nivea Body Extra Enriched Teacher, adult education Moisturizing Lotion Nivea Crme  Nivea Skin Firming Lotion  NutraDerm 30 Skin Lotion  NutraDerm Skin Lotion  NutraDerm Therapeutic Skin Cream  NutraDerm Therapeutic Skin Lotion  ProShield Protective Hand Cream  Provon moisturizing lotion

## 2023-05-09 ENCOUNTER — Ambulatory Visit (HOSPITAL_COMMUNITY): Payer: Self-pay | Admitting: Emergency Medicine

## 2023-05-09 DIAGNOSIS — G8929 Other chronic pain: Secondary | ICD-10-CM

## 2023-05-09 NOTE — H&P (Signed)
TOTAL KNEE ADMISSION H&P  Patient is being admitted for left total knee arthroplasty.  Subjective:  Chief Complaint:left knee pain.  HPI: [facility], 72 y.o. female, has a history of pain and functional disability in the left knee due to trauma and arthritis and has failed non-surgical conservative treatments for greater than 12 weeks to includeNSAID's and/or analgesics, corticosteriod injections, viscosupplementation injections, and activity modification.  Onset of symptoms was gradual, starting 4 years ago with gradually worsening course since that time. The patient noted prior procedures on the knee to include  arthroscopy on the left knee(s).  Patient currently rates pain in the left knee(s) at 8 out of 10 with activity. Patient has night pain, worsening of pain with activity and weight bearing, pain that interferes with activities of daily living, and pain with passive range of motion.  Patient has evidence of periarticular osteophytes and joint space narrowing by imaging studies.  There is no active infection.  Patient Active Problem List   Diagnosis Date Noted   Paroxysmal atrial fibrillation (HCC) 02/18/2023   Insomnia 02/18/2023   Mild intermittent asthma without complication 06/08/2022   Nuclear sclerotic cataract of both eyes 07/25/2021   Macular pucker of both eyes 07/25/2021   Lamellar macular hole, left eye 07/25/2021   Spontaneous rupture of extensor tendon of left foot 06/05/2021   Elevated LDL cholesterol level 05/09/2021   Elevated random blood glucose level 05/09/2021   Asplenia 03/16/2021   OAB (overactive bladder) 02/22/2021   History of splenectomy 06/27/2020   Pre-operative clearance 06/27/2020   Baker's cyst of knee, left 05/30/2020   Primary osteoarthritis of left knee 05/23/2020   Left foot pain 04/11/2020   Lumbar spondylosis 09/28/2019   Genetic testing 07/06/2019   Family history of breast cancer    Primary osteoarthritis of left hip 05/13/2019    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   Osteopenia 05/13/2019   Peroneal tendinitis, left 02/16/2019   Subluxation of left extensor carpi ulnaris tendon 02/02/2019   Fracture of fibula, left, closed 12/24/2018   Abnormal liver function tests 04/09/2015   Past Medical History:  Diagnosis Date   Asplenia    Family history of breast cancer    GERD (gastroesophageal reflux disease)    Liver disease, unspecified    heterogenous echotexture   Portal hypertension (HCC)    Splenic infarct     Past Surgical History:  Procedure Laterality Date   ABDOMINAL HYSTERECTOMY     BREAST BIOPSY Bilateral 50 yrs ago   benign   CHONDROPLASTY Left 07/28/2020   Procedure: CHONDROPLASTY LEFT KNEE;  Surgeon: Bjorn Pippin, MD;  Location: [facility];  Service: Orthopedics;  Laterality: Left;   HERNIA REPAIR     INGUINAL HERNIA REPAIR Right 05/10/2021   Procedure: RIGHT INGUINAL HERNIA REPAIR WITH MESH;  Surgeon: Abigail Miyamoto, MD;  Location: [facility];  Service: General;  Laterality: Right;   KNEE ARTHROSCOPY WITH LATERAL MENISECTOMY Left 07/28/2020   Procedure: LEFT KNEE ARTHROSCOPY WITH LATERAL MENISECTOMY;  Surgeon: Bjorn Pippin, MD;  Location: [facility];  Service: Orthopedics;  Laterality: Left;   KNEE ARTHROSCOPY WITH MEDIAL MENISECTOMY Left 07/28/2020   Procedure: LEFT KNEE ARTHROSCOPY WITH MEDIAL MENISECTOMY;  Surgeon: Bjorn Pippin, MD;  Location: [facility];  Service: Orthopedics;  Laterality: Left;   SPLENECTOMY, TOTAL      Current Outpatient Medications  Medication Sig Dispense Refill Last Dose   Acetylcysteine (NAC) 500 MG CAPS Take 500 mg  by mouth 2 (two) times daily.      ALPRAZolam (XANAX) 0.25 MG tablet Take 1 tablet (0.25 mg total) by mouth at bedtime as needed for anxiety. 30 tablet 1    apixaban (ELIQUIS) 5 MG TABS tablet Take 1 tablet (5 mg total) by mouth 2 (two) times daily. 180 tablet 3    Ascorbic Acid (VITAMIN  C) 500 MG CAPS Take 500 mg by mouth daily. liposoma      b complex vitamins capsule Take 1 capsule by mouth daily.      Cholecalciferol (VITAMIN D3) 125 MCG (5000 UT) CAPS Take 5,000 Units by mouth daily.      diltiazem (CARDIZEM CD) 120 MG 24 hr capsule Take 1 capsule (120 mg total) by mouth daily. 90 capsule 3    diltiazem (CARDIZEM) 30 MG tablet Take 1 tablet every 4 hours AS NEEDED for AFIB heart rate >100 as long as top BP >100. 30 tablet 1    GLYCINE PO Take 3,000 mg by mouth at bedtime.      lidocaine 4 % Place 1 patch onto the skin daily as needed (pain).      MAGNESIUM PO Take 280 mg by mouth at bedtime.      Nutritional Supplements (CHLORELLA-SPIRULINA COMPLEX PO) Take 6 tablets by mouth daily.      Omega-3 Fatty Acids (FISH OIL PO) Take 640 mg by mouth 2 (two) times daily.      OVER THE COUNTER MEDICATION Take 30 mg by mouth 2 (two) times daily. Bio-Quercetin supplement      oxymetazoline (AFRIN) 0.05 % nasal spray Place 1 spray into both nostrils at bedtime as needed for congestion.      triamcinolone cream (KENALOG) 0.1 % Apply topically once a day as needed (eczema). 450 g 1    Vitamin D-Vitamin K (VITAMIN K2-VITAMIN D3 PO) Take 1 tablet by mouth daily. Vitamin d 4000 units Vitamin K2 100 mg      Zinc Sulfate (ZINC 15 PO) Take 15 mg by mouth daily.      No current facility-administered medications for this visit.   Allergies  Allergen Reactions   Clindamycin Hives   Codeine Rash   Pentazocine Hives   Sulfamethoxazole-Trimethoprim Itching and Rash     2017 Reaction: rash, itching 03/16/2021: faint rash and itching 30 min after bactrim 400/80 oral challenge.    Doxycycline Other (See Comments)    Itching without rash near end of course    Levofloxacin Rash and Other (See Comments)    tendonitis    Neomycin Itching   Sulfa Antibiotics Rash    Social History   Tobacco Use   Smoking status: Never   Smokeless tobacco: Never  Substance Use Topics   Alcohol use: Yes     Alcohol/week: 7.0 standard drinks of alcohol    Types: 7 Glasses of wine per week    Family History  Problem Relation Age of Onset   Breast cancer Mother 46   Stroke Mother    Hypertension Father    Heart attack Father    Diabetes Sister    Breast cancer Paternal Aunt    Breast cancer Paternal Grandmother        dx 67s   Rheum arthritis Daughter      Review of Systems  Musculoskeletal:  Positive for arthralgias.  All other systems reviewed and are negative.   Objective:  Physical Exam Constitutional:      General: She is not in acute distress.  Appearance: Normal appearance. She is not ill-appearing.  HENT:     Head: Normocephalic and atraumatic.     Right Ear: External ear normal.     Left Ear: External ear normal.     Nose: Nose normal.     Mouth/Throat:     Mouth: Mucous membranes are moist.     Pharynx: Oropharynx is clear.  Eyes:     Extraocular Movements: Extraocular movements intact.     Conjunctiva/sclera: Conjunctivae normal.  Cardiovascular:     Rate and Rhythm: Normal rate and regular rhythm.     Pulses: Normal pulses.     Heart sounds: Normal heart sounds.     Comments: RRR without M/R/G Pulmonary:     Effort: Pulmonary effort is normal.     Breath sounds: Normal breath sounds.  Abdominal:     General: Bowel sounds are normal.     Palpations: Abdomen is soft.     Tenderness: There is no abdominal tenderness.  Musculoskeletal:        General: Tenderness present.     Cervical back: Normal range of motion and neck supple.     Comments: Left knee with joint line tenderness, lateral worse than medial.  No effusion.  ROM 0-120 bilaterally, with pain elicited of left knee.  Minimally antalgic gait.  No calf pain or swelling.  BLE apper grossly neurovascularly intact.  No lesion over area of chief complaint.  Skin:    General: Skin is warm and dry.  Neurological:     Mental Status: She is alert and oriented to person, place, and time. Mental status is at  baseline.  Psychiatric:        Mood and Affect: Mood normal.        Behavior: Behavior normal.     Vital signs in last 24 hours: @VSRANGES @  Labs:   Estimated body mass index is 21.52 kg/m as calculated from the following:   Height as of 04/15/23: 5\' 10"  (1.778 m).   Weight as of 04/15/23: 68 kg.   Imaging Review Plain radiographs demonstrate severe degenerative joint disease of the left knee(s).  The bone quality appears to be good for age and reported activity level.   Assessment/Plan:  End stage arthritis, left knee   The patient history, physical examination, clinical judgment of the provider and imaging studies are consistent with end stage degenerative joint disease of the left knee(s) and total knee arthroplasty is deemed medically necessary. The treatment options including medical management, injection therapy arthroscopy and arthroplasty were discussed at length. The risks and benefits of total knee arthroplasty were presented and reviewed. The risks due to aseptic loosening, infection, stiffness, patella tracking problems, thromboembolic complications and other imponderables were discussed. The patient acknowledged the explanation, agreed to proceed with the plan and consent was signed. Patient is being admitted for inpatient treatment for surgery, pain control, PT, OT, prophylactic antibiotics, VTE prophylaxis, progressive ambulation and ADL's and discharge planning. The patient is planning to be admitted for observation with anticipated discharge to home with outpatient PT.   Anticipated LOS equal to or greater than 2 midnights due to - Age 36 and older with one or more of the following:  - Obesity  - Expected need for hospital services (PT, OT, Nursing) required for safe  discharge  - Anticipated need for postoperative skilled nursing care or inpatient rehab  - Active co-morbidities: Cardiac Arrhythmia and portal HTN, splenic infarct, asthma, asplenic, on chronic  anticoagulation OR   - Unanticipated  findings during/Post Surgery: None  - Patient is a high risk of re-admission due to: None

## 2023-05-09 NOTE — H&P (View-Only) (Signed)
TOTAL KNEE ADMISSION H&P  Patient is being admitted for left total knee arthroplasty.  Subjective:  Chief Complaint:left knee pain.  HPI: Donna Ramsey, 71 y.o. female, has a history of pain and functional disability in the left knee due to trauma and arthritis and has failed non-surgical conservative treatments for greater than 12 weeks to includeNSAID's and/or analgesics, corticosteriod injections, viscosupplementation injections, and activity modification.  Onset of symptoms was gradual, starting 4 years ago with gradually worsening course since that time. The patient noted prior procedures on the knee to include  arthroscopy on the left knee(s).  Patient currently rates pain in the left knee(s) at 8 out of 10 with activity. Patient has night pain, worsening of pain with activity and weight bearing, pain that interferes with activities of daily living, and pain with passive range of motion.  Patient has evidence of periarticular osteophytes and joint space narrowing by imaging studies.  There is no active infection.  Patient Active Problem List   Diagnosis Date Noted   Paroxysmal atrial fibrillation (HCC) 02/18/2023   Insomnia 02/18/2023   Mild intermittent asthma without complication 06/08/2022   Nuclear sclerotic cataract of both eyes 07/25/2021   Macular pucker of both eyes 07/25/2021   Lamellar macular hole, left eye 07/25/2021   Spontaneous rupture of extensor tendon of left foot 06/05/2021   Elevated LDL cholesterol level 05/09/2021   Elevated random blood glucose level 05/09/2021   Asplenia 03/16/2021   OAB (overactive bladder) 02/22/2021   History of splenectomy 06/27/2020   Pre-operative clearance 06/27/2020   Baker's cyst of knee, left 05/30/2020   Primary osteoarthritis of left knee 05/23/2020   Left foot pain 04/11/2020   Lumbar spondylosis 09/28/2019   Genetic testing 07/06/2019   Family history of breast cancer    Primary osteoarthritis of left hip 05/13/2019    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   Osteopenia 05/13/2019   Peroneal tendinitis, left 02/16/2019   Subluxation of left extensor carpi ulnaris tendon 02/02/2019   Fracture of fibula, left, closed 12/24/2018   Abnormal liver function tests 04/09/2015   Past Medical History:  Diagnosis Date   Asplenia    Family history of breast cancer    GERD (gastroesophageal reflux disease)    Liver disease, unspecified    heterogenous echotexture   Portal hypertension (HCC)    Splenic infarct     Past Surgical History:  Procedure Laterality Date   ABDOMINAL HYSTERECTOMY     BREAST BIOPSY Bilateral 50 yrs ago   benign   CHONDROPLASTY Left 07/28/2020   Procedure: CHONDROPLASTY LEFT KNEE;  Surgeon: Varkey, Dax T, MD;  Location: [facility];  Service: Orthopedics;  Laterality: Left;   HERNIA REPAIR     INGUINAL HERNIA REPAIR Right 05/10/2021   Procedure: RIGHT INGUINAL HERNIA REPAIR WITH MESH;  Surgeon: Blackman, Douglas, MD;  Location: [facility];  Service: General;  Laterality: Right;   KNEE ARTHROSCOPY WITH LATERAL MENISECTOMY Left 07/28/2020   Procedure: LEFT KNEE ARTHROSCOPY WITH LATERAL MENISECTOMY;  Surgeon: Varkey, Dax T, MD;  Location: [facility];  Service: Orthopedics;  Laterality: Left;   KNEE ARTHROSCOPY WITH MEDIAL MENISECTOMY Left 07/28/2020   Procedure: LEFT KNEE ARTHROSCOPY WITH MEDIAL MENISECTOMY;  Surgeon: Varkey, Dax T, MD;  Location: [facility];  Service: Orthopedics;  Laterality: Left;   SPLENECTOMY, TOTAL      Current Outpatient Medications  Medication Sig Dispense Refill Last Dose   Acetylcysteine (NAC) 500 MG CAPS Take 500 mg   by mouth 2 (two) times daily.      ALPRAZolam (XANAX) 0.25 MG tablet Take 1 tablet (0.25 mg total) by mouth at bedtime as needed for anxiety. 30 tablet 1    apixaban (ELIQUIS) 5 MG TABS tablet Take 1 tablet (5 mg total) by mouth 2 (two) times daily. 180 tablet 3    Ascorbic Acid (VITAMIN  C) 500 MG CAPS Take 500 mg by mouth daily. liposoma      b complex vitamins capsule Take 1 capsule by mouth daily.      Cholecalciferol (VITAMIN D3) 125 MCG (5000 UT) CAPS Take 5,000 Units by mouth daily.      diltiazem (CARDIZEM CD) 120 MG 24 hr capsule Take 1 capsule (120 mg total) by mouth daily. 90 capsule 3    diltiazem (CARDIZEM) 30 MG tablet Take 1 tablet every 4 hours AS NEEDED for AFIB heart rate >100 as long as top BP >100. 30 tablet 1    GLYCINE PO Take 3,000 mg by mouth at bedtime.      lidocaine 4 % Place 1 patch onto the skin daily as needed (pain).      MAGNESIUM PO Take 280 mg by mouth at bedtime.      Nutritional Supplements (CHLORELLA-SPIRULINA COMPLEX PO) Take 6 tablets by mouth daily.      Omega-3 Fatty Acids (FISH OIL PO) Take 640 mg by mouth 2 (two) times daily.      OVER THE COUNTER MEDICATION Take 30 mg by mouth 2 (two) times daily. Bio-Quercetin supplement      oxymetazoline (AFRIN) 0.05 % nasal spray Place 1 spray into both nostrils at bedtime as needed for congestion.      triamcinolone cream (KENALOG) 0.1 % Apply topically once a day as needed (eczema). 450 g 1    Vitamin D-Vitamin K (VITAMIN K2-VITAMIN D3 PO) Take 1 tablet by mouth daily. Vitamin d 4000 units Vitamin K2 100 mg      Zinc Sulfate (ZINC 15 PO) Take 15 mg by mouth daily.      No current facility-administered medications for this visit.   Allergies  Allergen Reactions   Clindamycin Hives   Codeine Rash   Pentazocine Hives   Sulfamethoxazole-Trimethoprim Itching and Rash     2017 Reaction: rash, itching 03/16/2021: faint rash and itching 30 min after bactrim 400/80 oral challenge.    Doxycycline Other (See Comments)    Itching without rash near end of course    Levofloxacin Rash and Other (See Comments)    tendonitis    Neomycin Itching   Sulfa Antibiotics Rash    Social History   Tobacco Use   Smoking status: Never   Smokeless tobacco: Never  Substance Use Topics   Alcohol use: Yes     Alcohol/week: 7.0 standard drinks of alcohol    Types: 7 Glasses of wine per week    Family History  Problem Relation Age of Onset   Breast cancer Mother 68   Stroke Mother    Hypertension Father    Heart attack Father    Diabetes Sister    Breast cancer Paternal Aunt    Breast cancer Paternal Grandmother        dx 40s   Rheum arthritis Daughter      Review of Systems  Musculoskeletal:  Positive for arthralgias.  All other systems reviewed and are negative.   Objective:  Physical Exam Constitutional:      General: She is not in acute distress.      Appearance: Normal appearance. She is not ill-appearing.  HENT:     Head: Normocephalic and atraumatic.     Right Ear: External ear normal.     Left Ear: External ear normal.     Nose: Nose normal.     Mouth/Throat:     Mouth: Mucous membranes are moist.     Pharynx: Oropharynx is clear.  Eyes:     Extraocular Movements: Extraocular movements intact.     Conjunctiva/sclera: Conjunctivae normal.  Cardiovascular:     Rate and Rhythm: Normal rate and regular rhythm.     Pulses: Normal pulses.     Heart sounds: Normal heart sounds.     Comments: RRR without M/R/G Pulmonary:     Effort: Pulmonary effort is normal.     Breath sounds: Normal breath sounds.  Abdominal:     General: Bowel sounds are normal.     Palpations: Abdomen is soft.     Tenderness: There is no abdominal tenderness.  Musculoskeletal:        General: Tenderness present.     Cervical back: Normal range of motion and neck supple.     Comments: Left knee with joint line tenderness, lateral worse than medial.  No effusion.  ROM 0-120 bilaterally, with pain elicited of left knee.  Minimally antalgic gait.  No calf pain or swelling.  BLE apper grossly neurovascularly intact.  No lesion over area of chief complaint.  Skin:    General: Skin is warm and dry.  Neurological:     Mental Status: She is alert and oriented to person, place, and time. Mental status is at  baseline.  Psychiatric:        Mood and Affect: Mood normal.        Behavior: Behavior normal.     Vital signs in last 24 hours: @VSRANGES@  Labs:   Estimated body mass index is 21.52 kg/m as calculated from the following:   Height as of 04/15/23: 5' 10" (1.778 m).   Weight as of 04/15/23: 68 kg.   Imaging Review Plain radiographs demonstrate severe degenerative joint disease of the left knee(s).  The bone quality appears to be good for age and reported activity level.   Assessment/Plan:  End stage arthritis, left knee   The patient history, physical examination, clinical judgment of the provider and imaging studies are consistent with end stage degenerative joint disease of the left knee(s) and total knee arthroplasty is deemed medically necessary. The treatment options including medical management, injection therapy arthroscopy and arthroplasty were discussed at length. The risks and benefits of total knee arthroplasty were presented and reviewed. The risks due to aseptic loosening, infection, stiffness, patella tracking problems, thromboembolic complications and other imponderables were discussed. The patient acknowledged the explanation, agreed to proceed with the plan and consent was signed. Patient is being admitted for inpatient treatment for surgery, pain control, PT, OT, prophylactic antibiotics, VTE prophylaxis, progressive ambulation and ADL's and discharge planning. The patient is planning to be admitted for observation with anticipated discharge to home with outpatient PT.   Anticipated LOS equal to or greater than 2 midnights due to - Age 65 and older with one or more of the following:  - Obesity  - Expected need for hospital services (PT, OT, Nursing) required for safe  discharge  - Anticipated need for postoperative skilled nursing care or inpatient rehab  - Active co-morbidities: Cardiac Arrhythmia and portal HTN, splenic infarct, asthma, asplenic, on chronic  anticoagulation OR   - Unanticipated   findings during/Post Surgery: None  - Patient is a high risk of re-admission due to: None 

## 2023-05-14 ENCOUNTER — Encounter (HOSPITAL_COMMUNITY): Payer: Self-pay

## 2023-05-14 ENCOUNTER — Other Ambulatory Visit: Payer: Self-pay

## 2023-05-14 ENCOUNTER — Encounter (HOSPITAL_COMMUNITY)
Admission: RE | Admit: 2023-05-14 | Discharge: 2023-05-14 | Disposition: A | Discharge status: Home / Self Care | Payer: Medicare Other | Source: Ambulatory Visit | Attending: Orthopedic Surgery | Admitting: Orthopedic Surgery

## 2023-05-14 VITALS — BP 113/79 | HR 61 | Temp 97.6°F | Resp 16 | Ht 69.0 in | Wt 143.0 lb

## 2023-05-14 DIAGNOSIS — Z01812 Encounter for preprocedural laboratory examination: Secondary | ICD-10-CM | POA: Insufficient documentation

## 2023-05-14 DIAGNOSIS — G8929 Other chronic pain: Secondary | ICD-10-CM | POA: Diagnosis not present

## 2023-05-14 DIAGNOSIS — M25562 Pain in left knee: Secondary | ICD-10-CM | POA: Insufficient documentation

## 2023-05-14 DIAGNOSIS — Z01818 Encounter for other preprocedural examination: Secondary | ICD-10-CM

## 2023-05-14 HISTORY — DX: Unspecified osteoarthritis, unspecified site: M19.90

## 2023-05-14 HISTORY — DX: Pneumonia, unspecified organism: J18.9

## 2023-05-14 HISTORY — DX: Unspecified asthma, uncomplicated: J45.909

## 2023-05-14 HISTORY — DX: Unspecified atrial fibrillation: I48.91

## 2023-05-14 HISTORY — DX: Nausea with vomiting, unspecified: R11.2

## 2023-05-14 HISTORY — DX: Essential (primary) hypertension: I10

## 2023-05-14 HISTORY — DX: Sleep apnea, unspecified: G47.30

## 2023-05-14 HISTORY — DX: Other specified postprocedural states: Z98.890

## 2023-05-14 LAB — CBC WITH DIFFERENTIAL/PLATELET
Abs Immature Granulocytes: 0.02 10*3/uL (ref 0.00–0.07)
Basophils Absolute: 0.1 10*3/uL (ref 0.0–0.1)
Basophils Relative: 1 %
Eosinophils Absolute: 0.1 10*3/uL (ref 0.0–0.5)
Eosinophils Relative: 2 %
HCT: 40.5 % (ref 36.0–46.0)
Hemoglobin: 14.2 g/dL (ref 12.0–15.0)
Immature Granulocytes: 0 %
Lymphocytes Relative: 28 %
Lymphs Abs: 1.5 10*3/uL (ref 0.7–4.0)
MCH: 34.1 pg — ABNORMAL HIGH (ref 26.0–34.0)
MCHC: 35.1 g/dL (ref 30.0–36.0)
MCV: 97.1 fL (ref 80.0–100.0)
Monocytes Absolute: 0.7 10*3/uL (ref 0.1–1.0)
Monocytes Relative: 14 %
Neutro Abs: 2.9 10*3/uL (ref 1.7–7.7)
Neutrophils Relative %: 55 %
Platelets: 322 10*3/uL (ref 150–400)
RBC: 4.17 MIL/uL (ref 3.87–5.11)
RDW: 13.8 % (ref 11.5–15.5)
WBC: 5.3 10*3/uL (ref 4.0–10.5)
nRBC: 0 % (ref 0.0–0.2)

## 2023-05-14 LAB — COMPREHENSIVE METABOLIC PANEL
ALT: 23 U/L (ref 0–44)
AST: 29 U/L (ref 15–41)
Albumin: 3.8 g/dL (ref 3.5–5.0)
Alkaline Phosphatase: 62 U/L (ref 38–126)
Anion gap: 9 (ref 5–15)
BUN: 15 mg/dL (ref 8–23)
CO2: 24 mmol/L (ref 22–32)
Calcium: 9.3 mg/dL (ref 8.9–10.3)
Chloride: 102 mmol/L (ref 98–111)
Creatinine, Ser: 0.52 mg/dL (ref 0.44–1.00)
GFR, Estimated: >60 mL/min (ref >60)
Glucose, Bld: 92 mg/dL (ref 70–99)
Potassium: 4.3 mmol/L (ref 3.5–5.1)
Sodium: 135 mmol/L (ref 135–145)
Total Bilirubin: 0.8 mg/dL (ref 0.3–1.2)
Total Protein: 7.4 g/dL (ref 6.5–8.1)

## 2023-05-14 LAB — TYPE AND SCREEN
ABO/RH(D): A POS
Antibody Screen: NEGATIVE

## 2023-05-14 LAB — SURGICAL PCR SCREEN
MRSA, PCR: NEGATIVE
Staphylococcus aureus: NEGATIVE

## 2023-05-15 ENCOUNTER — Telehealth: Payer: Self-pay | Admitting: *Deleted

## 2023-05-15 DIAGNOSIS — R198 Other specified symptoms and signs involving the digestive system and abdomen: Secondary | ICD-10-CM | POA: Diagnosis not present

## 2023-05-15 DIAGNOSIS — K766 Portal hypertension: Secondary | ICD-10-CM | POA: Diagnosis not present

## 2023-05-15 NOTE — Telephone Encounter (Signed)
   Name: Donna Ramsey  DOB: 1951-01-15  MRN: [id number]  Primary Cardiologist: None   Preoperative team, please contact this patient and set up a phone call appointment for further preoperative risk assessment. Please obtain consent and complete medication review. Thank you for your help.  I confirm that guidance regarding antiplatelet and oral anticoagulation therapy has been completed and, if necessary, noted below.  Per office protocol, patient can hold Eliquis for 2 days prior to procedure.   Patient will not need bridging with Lovenox (enoxaparin) around procedure.  Napoleon Form, Leodis Rains, NP 05/15/2023, 1:10 PM [facility]

## 2023-05-15 NOTE — Telephone Encounter (Signed)
Patient with diagnosis of atrial fibrillation on Eliquis for anticoagulation.    Procedure: colonoscopy/EGD Date of procedure: TBD   CHA2DS2-VASc Score = 2   This indicates a 2.2% annual risk of stroke. The patient's score is based upon: CHF History: 0 HTN History: 0 Diabetes History: 0 Stroke History: 0 Vascular Disease History: 0 Age Score: 1 Gender Score: 1      CrCl 109 Platelet count 322  Per office protocol, patient can hold Eliquis for 2 days prior to procedure.   Patient will not need bridging with Lovenox (enoxaparin) around procedure.  **This guidance is not considered finalized until pre-operative APP has relayed final recommendations.**

## 2023-05-15 NOTE — Telephone Encounter (Signed)
Pharmacy please advise on holding Eliquis prior to colonoscopy scheduled for TBD. Thank you.   

## 2023-05-15 NOTE — Telephone Encounter (Signed)
   Pre-operative Risk Assessment    Patient Name: Donna Ramsey  DOB: June 11, 1951 MRN: [id number]      Request for Surgical Clearance    Procedure:   Colonoscopy/EGD  Date of Surgery:  Clearance TBD                                 Surgeon:  Dr. Casey Burkitt Surgeon's Group or Practice Name:  [facility] Phone number:  [phone] Fax number:  [phone]   Type of Clearance Requested:   - Medical  - Pharmacy:  Hold Apixaban (Eliquis) 2 days prior   Type of Anesthesia:  Not Indicated   Additional requests/questions:    Signed, Emmit Pomfret   05/15/2023, 10:28 AM

## 2023-05-15 NOTE — Telephone Encounter (Signed)
CORRECTION ON SPELLING OF SURGEON'S NAME: BADREDDINE.   I s/w the pt and she tells me that the procedure with GI won't be until the FALL. We discussed best then to not set up tele appt now and will have the GI office send a new request when closer to when ready schedule in the FALL. Pt is agreeable with this plan as well.

## 2023-05-16 ENCOUNTER — Other Ambulatory Visit (HOSPITAL_COMMUNITY): Payer: Self-pay

## 2023-05-16 DIAGNOSIS — M1712 Unilateral primary osteoarthritis, left knee: Secondary | ICD-10-CM | POA: Diagnosis not present

## 2023-05-16 MED ORDER — TRAMADOL HCL 50 MG PO TABS
50.0000 mg | ORAL_TABLET | Freq: Three times a day (TID) | ORAL | 0 refills | Status: DC | PRN
  Filled 2023-05-16: qty 10, 4d supply, fill #0

## 2023-05-20 ENCOUNTER — Encounter (HOSPITAL_COMMUNITY): Payer: Self-pay

## 2023-05-20 NOTE — Progress Notes (Signed)
Case: [id number] Date/Time: 05/27/23 0715   Procedure: TOTAL KNEE ARTHROPLASTY (Left: Knee)   Anesthesia type: Spinal   Pre-op diagnosis: OA LEFT KNEE   [facility] ROOM 08 / [facility]   Surgeons: Joen Laura, MD       DISCUSSION: Donna Ramsey is a 72 yo female who presents to PAT prior to surgery listed above. Patient with hx of HTN, A.fib on Eliquis, mild OSA (no CPAP), liver disease with portal HTN, splenic infarct s/p splenectomy (2010), chronic neutropenia, MGUS, GERD  Prior anesthesia complications include PONV  Patient is followed by Cardiology for pAF and possible ASD. Last seen on 03/06/23. She was cleared from cardiac standpoint for upcoming knee replacement:  "She reports no ischemic or heart failure symptoms today. I think she is at acceptable risk to undergo orthopedic surgery. She should continue her calcium channel blocker in the perioperative period. It is okay for her to hold her anticoagulant for 2 to 3 days prior to the procedure and restart when felt safe from a surgical perspective."  Patient last saw GI on 04/30/23. Noted to be doing well. Scheduled for a screening colonoscopy and EGD in the fall.  Patient saw Heme on 04/11/23 and all issues noted to be stable. Most recent WBC and neutrophil count were normal. Advised to f/u in 1 year.   VS: BP 113/79   Pulse 61   Temp 36.4 C (Oral)   Resp 16   Ht 5\' 9"  (1.753 m)   Wt 64.9 kg   SpO2 100%   BMI 21.12 kg/m   PROVIDERS: Everrett Coombe, DO GI: Milta Deiters, MD Cardiology: Steffanie Dunn, MD  Heme/Onc: Nicanor Bake, MD  LABS: Labs reviewed: Acceptable for surgery. (all labs ordered are listed, but only abnormal results are displayed)  Labs Reviewed  CBC WITH DIFFERENTIAL/PLATELET - Abnormal; Notable for the following components:      Result Value   MCH 34.1 (*)    All other components within normal limits  SURGICAL PCR SCREEN  COMPREHENSIVE METABOLIC PANEL  TYPE AND SCREEN      IMAGES:  CXR 01/17/23:  FINDINGS: Artifact from EKG leads.   Hyperinflation and interstitial coarsening which is stable. Normal heart size and mediastinal contours. There is no edema, consolidation, effusion, or pneumothorax.   Artifact from EKG leads.   IMPRESSION: Stable exam.  No evidence of acute disease.     EKG 02/06/23:  Sinus bradycardia, rate 53   CV:  Echo 03/04/23:  IMPRESSIONS     1. Left ventricular ejection fraction, by estimation, is 60 to 65%. The  left ventricle has normal function. The left ventricle has no regional  wall motion abnormalities. Left ventricular diastolic parameters were  normal.   2. Right ventricular systolic function is normal. The right ventricular  size is normal. There is normal pulmonary artery systolic pressure.   3. There appears to be a left to right atrial communication seen only in  the four chamber view (Img 64). Given coronary sinus dilation and right  atrial enlargement future CT PV is planned for evaluation.   4. Left atrial size was mildly dilated.   5. The mitral valve is normal in structure. No evidence of mitral valve  regurgitation. No evidence of mitral stenosis.   6. Mild thickening of NCC. The aortic valve is tricuspid. Aortic valve  regurgitation is trivial.   7. The inferior vena cava is normal in size with greater than 50%  respiratory variability, suggesting right atrial pressure  of 3 mmHg.   8. Evidence of atrial level shunting detected by color flow Doppler.   CT Calcium score 11/14/22:  IMPRESSION: Coronary calcium score of 0.  Past Medical History:  Diagnosis Date   A-fib [facility])    Arthritis    Asplenia    Asthma    as a child   Family history of breast cancer    Hypertension    Liver disease, unspecified    heterogenous echotexture   Pneumonia    PONV (postoperative nausea and vomiting)    Portal hypertension (HCC)    Sleep apnea    mild  no cpap   Splenic infarct     Past  Surgical History:  Procedure Laterality Date   ABDOMINAL HYSTERECTOMY     BREAST BIOPSY Bilateral 50 yrs ago   benign   CHONDROPLASTY Left 07/28/2020   Procedure: CHONDROPLASTY LEFT KNEE;  Surgeon: Bjorn Pippin, MD;  Location: [facility];  Service: Orthopedics;  Laterality: Left;   HERNIA REPAIR     INGUINAL HERNIA REPAIR Right 05/10/2021   Procedure: RIGHT INGUINAL HERNIA REPAIR WITH MESH;  Surgeon: Abigail Miyamoto, MD;  [facility];  Service: General;  Laterality: Right;   KNEE ARTHROSCOPY WITH LATERAL MENISECTOMY Left 07/28/2020   Procedure: LEFT KNEE ARTHROSCOPY WITH LATERAL MENISECTOMY;  Surgeon: Bjorn Pippin, MD;  Location: [facility];  Service: Orthopedics;  Laterality: Left;   KNEE ARTHROSCOPY WITH MEDIAL MENISECTOMY Left 07/28/2020   Procedure: LEFT KNEE ARTHROSCOPY WITH MEDIAL MENISECTOMY;  Surgeon: Bjorn Pippin, MD;  Location: [facility];  Service: Orthopedics;  Laterality: Left;   SPLENECTOMY, TOTAL      MEDICATIONS:  Acetylcysteine (NAC) 500 MG CAPS   ALPRAZolam (XANAX) 0.25 MG tablet   apixaban (ELIQUIS) 5 MG TABS tablet   Ascorbic Acid (VITAMIN C) 500 MG CAPS   b complex vitamins capsule   Cholecalciferol (VITAMIN D3) 125 MCG (5000 UT) CAPS   diltiazem (CARDIZEM CD) 120 MG 24 hr capsule   diltiazem (CARDIZEM) 30 MG tablet   GLYCINE PO   lidocaine 4 %   MAGNESIUM PO   Nutritional Supplements (CHLORELLA-SPIRULINA COMPLEX PO)   Omega-3 Fatty Acids (FISH OIL PO)   OVER THE COUNTER MEDICATION   oxymetazoline (AFRIN) 0.05 % nasal spray   traMADol (ULTRAM) 50 MG tablet   triamcinolone cream (KENALOG) 0.1 %   Vitamin D-Vitamin K (VITAMIN K2-VITAMIN D3 PO)   Zinc Sulfate (ZINC 15 PO)   No current facility-administered medications for this encounter.   Marcille Blanco [facility] Short Stay/Anesthesiology [facility] Phone [phone] 05/20/2023 2:18 PM

## 2023-05-20 NOTE — Anesthesia Preprocedure Evaluation (Addendum)
Anesthesia Evaluation  Patient identified by MRN, date of birth, ID band Patient awake    Reviewed: Allergy & Precautions, NPO status , Patient's Chart, lab work & pertinent test results  History of Anesthesia Complications (+) PONV and history of anesthetic complications  Airway Mallampati: I  TM Distance: >3 FB Neck ROM: Full    Dental no notable dental hx. (+) Teeth Intact, Dental Advisory Given   Pulmonary asthma , sleep apnea (no CPAP)    Pulmonary exam normal breath sounds clear to auscultation       Cardiovascular hypertension, Pt. on medications Normal cardiovascular exam+ dysrhythmias (eliquis, last dose 6/6 830pm) Atrial Fibrillation  Rhythm:Regular Rate:Normal  Echo 03/04/23:   IMPRESSIONS     1. Left ventricular ejection fraction, by estimation, is 60 to 65%. The  left ventricle has normal function. The left ventricle has no regional  wall motion abnormalities. Left ventricular diastolic parameters were  normal.   2. Right ventricular systolic function is normal. The right ventricular  size is normal. There is normal pulmonary artery systolic pressure.   3. There appears to be a left to right atrial communication seen only in  the four chamber view (Img 64). Given coronary sinus dilation and right  atrial enlargement future CT PV is planned for evaluation.   4. Left atrial size was mildly dilated.   5. The mitral valve is normal in structure. No evidence of mitral valve  regurgitation. No evidence of mitral stenosis.   6. Mild thickening of NCC. The aortic valve is tricuspid. Aortic valve  regurgitation is trivial.   7. The inferior vena cava is normal in size with greater than 50%  respiratory variability, suggesting right atrial pressure of 3 mmHg.   8. Evidence of atrial level shunting detected by color flow Doppler.    CT Calcium score 11/14/22:  IMPRESSION: Coronary calcium score of 0    Neuro/Psych     GI/Hepatic ,,,(+) Cirrhosis         Endo/Other    Renal/GU      Musculoskeletal  (+) Arthritis ,    Abdominal   Peds  Hematology   Anesthesia Other Findings Patient with hx of HTN, A.fib on Eliquis, mild OSA (no CPAP), liver disease with portal HTN, splenic infarct s/p splenectomy (2010), chronic neutropenia, MGUS, GERD  Reproductive/Obstetrics                             Anesthesia Physical Anesthesia Plan  ASA: 3  Anesthesia Plan: MAC, Spinal and Regional   Post-op Pain Management: Regional block* and Tylenol PO (pre-op)*   Induction: Intravenous  PONV Risk Score and Plan: 3 and Midazolam, Dexamethasone and Ondansetron  Airway Management Planned: Natural Airway  Additional Equipment:   Intra-op Plan:   Post-operative Plan:   Informed Consent: I have reviewed the patients History and Physical, chart, labs and discussed the procedure including the risks, benefits and alternatives for the proposed anesthesia with the patient or authorized representative who has indicated his/her understanding and acceptance.     Dental advisory given  Plan Discussed with: CRNA  Anesthesia Plan Comments: (See PAT note from 5/28 by Sherlie Ban PA-C )        Anesthesia Quick Evaluation

## 2023-05-27 ENCOUNTER — Other Ambulatory Visit: Payer: Self-pay

## 2023-05-27 ENCOUNTER — Observation Stay (HOSPITAL_COMMUNITY)
Admission: RE | Admit: 2023-05-27 | Discharge: 2023-05-28 | Disposition: A | Discharge status: Home / Self Care | Payer: Medicare Other | Source: Ambulatory Visit | Attending: Orthopedic Surgery | Admitting: Orthopedic Surgery

## 2023-05-27 ENCOUNTER — Other Ambulatory Visit (HOSPITAL_COMMUNITY): Payer: Self-pay

## 2023-05-27 ENCOUNTER — Encounter (HOSPITAL_COMMUNITY): Payer: Self-pay | Admitting: Orthopedic Surgery

## 2023-05-27 ENCOUNTER — Observation Stay (HOSPITAL_COMMUNITY): Payer: Medicare Other

## 2023-05-27 ENCOUNTER — Ambulatory Visit (HOSPITAL_BASED_OUTPATIENT_CLINIC_OR_DEPARTMENT_OTHER): Payer: Medicare Other | Admitting: Anesthesiology

## 2023-05-27 ENCOUNTER — Ambulatory Visit (HOSPITAL_COMMUNITY): Payer: Medicare Other | Admitting: Physician Assistant

## 2023-05-27 ENCOUNTER — Encounter (HOSPITAL_COMMUNITY)
Admission: RE | Disposition: A | Discharge status: Home / Self Care | Payer: Self-pay | Source: Ambulatory Visit | Attending: Orthopedic Surgery

## 2023-05-27 DIAGNOSIS — M1712 Unilateral primary osteoarthritis, left knee: Secondary | ICD-10-CM

## 2023-05-27 DIAGNOSIS — I10 Essential (primary) hypertension: Secondary | ICD-10-CM

## 2023-05-27 DIAGNOSIS — Z79899 Other long term (current) drug therapy: Secondary | ICD-10-CM | POA: Insufficient documentation

## 2023-05-27 DIAGNOSIS — I480 Paroxysmal atrial fibrillation: Secondary | ICD-10-CM | POA: Insufficient documentation

## 2023-05-27 DIAGNOSIS — G4730 Sleep apnea, unspecified: Secondary | ICD-10-CM | POA: Diagnosis not present

## 2023-05-27 DIAGNOSIS — J45909 Unspecified asthma, uncomplicated: Secondary | ICD-10-CM | POA: Insufficient documentation

## 2023-05-27 DIAGNOSIS — I4891 Unspecified atrial fibrillation: Secondary | ICD-10-CM | POA: Diagnosis not present

## 2023-05-27 DIAGNOSIS — Z7901 Long term (current) use of anticoagulants: Secondary | ICD-10-CM | POA: Insufficient documentation

## 2023-05-27 DIAGNOSIS — G8918 Other acute postprocedural pain: Secondary | ICD-10-CM | POA: Diagnosis not present

## 2023-05-27 DIAGNOSIS — Z96652 Presence of left artificial knee joint: Secondary | ICD-10-CM | POA: Diagnosis not present

## 2023-05-27 DIAGNOSIS — Z471 Aftercare following joint replacement surgery: Secondary | ICD-10-CM | POA: Diagnosis not present

## 2023-05-27 HISTORY — PX: TOTAL KNEE ARTHROPLASTY: SHX125

## 2023-05-27 LAB — ABO/RH: ABO/RH(D): A POS

## 2023-05-27 SURGERY — ARTHROPLASTY, KNEE, TOTAL
Anesthesia: Monitor Anesthesia Care | Site: Knee | Laterality: Left

## 2023-05-27 MED ORDER — KETOROLAC TROMETHAMINE 15 MG/ML IJ SOLN
7.5000 mg | Freq: Four times a day (QID) | INTRAMUSCULAR | Status: AC
  Administered 2023-05-27 – 2023-05-28 (×4): 7.5 mg via INTRAVENOUS
  Filled 2023-05-27 (×5): qty 1

## 2023-05-27 MED ORDER — HYDROMORPHONE HCL 1 MG/ML IJ SOLN: 0.5000 mg | INTRAMUSCULAR | Status: DC | PRN

## 2023-05-27 MED ORDER — BUPIVACAINE HCL 0.25 % IJ SOLN
INTRAMUSCULAR | Status: AC
  Filled 2023-05-27: qty 1

## 2023-05-27 MED ORDER — DEXAMETHASONE SODIUM PHOSPHATE 10 MG/ML IJ SOLN: 8.0000 mg | Freq: Once | INTRAMUSCULAR | Status: AC

## 2023-05-27 MED ORDER — DIPHENHYDRAMINE HCL 12.5 MG/5ML PO ELIX: 12.5000 mg | ORAL_SOLUTION | ORAL | Status: DC | PRN

## 2023-05-27 MED ORDER — BUPIVACAINE LIPOSOME 1.3 % IJ SUSP
INTRAMUSCULAR | Status: AC
  Filled 2023-05-27: qty 20

## 2023-05-27 MED ORDER — ACETAMINOPHEN 500 MG PO TABS
1000.0000 mg | ORAL_TABLET | Freq: Once | ORAL | Status: AC
  Administered 2023-05-27: 1000 mg via ORAL
  Filled 2023-05-27: qty 2

## 2023-05-27 MED ORDER — DEXAMETHASONE SODIUM PHOSPHATE 10 MG/ML IJ SOLN
INTRAMUSCULAR | Status: AC
  Filled 2023-05-27: qty 1

## 2023-05-27 MED ORDER — PHENYLEPHRINE HCL-NACL 20-0.9 MG/250ML-% IV SOLN
INTRAVENOUS | Status: DC | PRN
  Administered 2023-05-27: 35 ug/min via INTRAVENOUS

## 2023-05-27 MED ORDER — SODIUM CHLORIDE 0.9 % IV SOLN: INTRAVENOUS | Status: DC

## 2023-05-27 MED ORDER — BUPIVACAINE-EPINEPHRINE 0.25% -1:200000 IJ SOLN
INTRAMUSCULAR | Status: DC | PRN
  Administered 2023-05-27: 30 mL

## 2023-05-27 MED ORDER — METHOCARBAMOL 500 MG PO TABS
500.0000 mg | ORAL_TABLET | Freq: Three times a day (TID) | ORAL | 0 refills | Status: AC | PRN
  Filled 2023-05-27: qty 30, 10d supply, fill #0

## 2023-05-27 MED ORDER — EPINEPHRINE PF 1 MG/ML IJ SOLN
INTRAMUSCULAR | Status: AC
  Filled 2023-05-27: qty 1

## 2023-05-27 MED ORDER — POVIDONE-IODINE 10 % EX SWAB
2.0000 | Freq: Once | CUTANEOUS | Status: AC
  Administered 2023-05-27: 2 via TOPICAL

## 2023-05-27 MED ORDER — DILTIAZEM HCL 30 MG PO TABS: 30.0000 mg | ORAL_TABLET | ORAL | Status: DC | PRN

## 2023-05-27 MED ORDER — LIDOCAINE HCL (PF) 2 % IJ SOLN
INTRAMUSCULAR | Status: AC
  Filled 2023-05-27: qty 5

## 2023-05-27 MED ORDER — MIDAZOLAM HCL 2 MG/2ML IJ SOLN
INTRAMUSCULAR | Status: AC
  Filled 2023-05-27: qty 2

## 2023-05-27 MED ORDER — ORAL CARE MOUTH RINSE: 15.0000 mL | OROMUCOSAL | Status: DC | PRN

## 2023-05-27 MED ORDER — MELOXICAM 7.5 MG PO TABS
7.5000 mg | ORAL_TABLET | Freq: Every day | ORAL | 0 refills | Status: AC
  Filled 2023-05-27: qty 30, 30d supply, fill #0

## 2023-05-27 MED ORDER — DEXAMETHASONE SODIUM PHOSPHATE 10 MG/ML IJ SOLN
INTRAMUSCULAR | Status: DC | PRN
  Administered 2023-05-27: 6 mg via INTRAVENOUS

## 2023-05-27 MED ORDER — SODIUM CHLORIDE (PF) 0.9 % IJ SOLN
INTRAMUSCULAR | Status: AC
  Filled 2023-05-27: qty 50

## 2023-05-27 MED ORDER — METHOCARBAMOL 500 MG PO TABS
500.0000 mg | ORAL_TABLET | Freq: Four times a day (QID) | ORAL | Status: DC | PRN
  Filled 2023-05-27: qty 1

## 2023-05-27 MED ORDER — LACTATED RINGERS IV SOLN: INTRAVENOUS | Status: DC

## 2023-05-27 MED ORDER — ONDANSETRON HCL 4 MG/2ML IJ SOLN: 4.0000 mg | Freq: Four times a day (QID) | INTRAMUSCULAR | Status: DC | PRN

## 2023-05-27 MED ORDER — FENTANYL CITRATE (PF) 100 MCG/2ML IJ SOLN
INTRAMUSCULAR | Status: AC
  Filled 2023-05-27: qty 2

## 2023-05-27 MED ORDER — ISOPROPYL ALCOHOL 70 % SOLN
Status: DC | PRN
  Administered 2023-05-27: 1 via TOPICAL

## 2023-05-27 MED ORDER — DOCUSATE SODIUM 100 MG PO CAPS
100.0000 mg | ORAL_CAPSULE | Freq: Two times a day (BID) | ORAL | Status: DC
  Administered 2023-05-27 – 2023-05-28 (×2): 100 mg via ORAL
  Filled 2023-05-27 (×2): qty 1

## 2023-05-27 MED ORDER — ACETAMINOPHEN 500 MG PO TABS: 1000.0000 mg | ORAL_TABLET | Freq: Once | ORAL | Status: DC

## 2023-05-27 MED ORDER — BUPIVACAINE IN DEXTROSE 0.75-8.25 % IT SOLN
INTRATHECAL | Status: DC | PRN
  Administered 2023-05-27: 2 mL via INTRATHECAL

## 2023-05-27 MED ORDER — CEFAZOLIN SODIUM-DEXTROSE 2-4 GM/100ML-% IV SOLN
2.0000 g | Freq: Four times a day (QID) | INTRAVENOUS | Status: AC
  Administered 2023-05-27 (×2): 2 g via INTRAVENOUS
  Filled 2023-05-27 (×2): qty 100

## 2023-05-27 MED ORDER — ROPIVACAINE HCL 5 MG/ML IJ SOLN
INTRAMUSCULAR | Status: DC | PRN
  Administered 2023-05-27: 20 mL via PERINEURAL

## 2023-05-27 MED ORDER — PANTOPRAZOLE SODIUM 40 MG PO TBEC
40.0000 mg | DELAYED_RELEASE_TABLET | Freq: Every day | ORAL | Status: DC
  Administered 2023-05-28: 40 mg via ORAL
  Filled 2023-05-27: qty 1

## 2023-05-27 MED ORDER — BUPIVACAINE LIPOSOME 1.3 % IJ SUSP
INTRAMUSCULAR | Status: DC | PRN
  Administered 2023-05-27: 20 mL

## 2023-05-27 MED ORDER — MIDAZOLAM HCL 5 MG/5ML IJ SOLN
INTRAMUSCULAR | Status: DC | PRN
  Administered 2023-05-27: 2 mg via INTRAVENOUS

## 2023-05-27 MED ORDER — TRANEXAMIC ACID-NACL 1000-0.7 MG/100ML-% IV SOLN
1000.0000 mg | INTRAVENOUS | Status: AC
  Administered 2023-05-27: 1000 mg via INTRAVENOUS
  Filled 2023-05-27: qty 100

## 2023-05-27 MED ORDER — PROPOFOL 10 MG/ML IV BOLUS
INTRAVENOUS | Status: AC
  Filled 2023-05-27: qty 20

## 2023-05-27 MED ORDER — ONDANSETRON HCL 4 MG PO TABS: 4.0000 mg | ORAL_TABLET | Freq: Four times a day (QID) | ORAL | Status: DC | PRN

## 2023-05-27 MED ORDER — BUPIVACAINE LIPOSOME 1.3 % IJ SUSP: 20.0000 mL | Freq: Once | INTRAMUSCULAR | Status: AC

## 2023-05-27 MED ORDER — MENTHOL 3 MG MT LOZG: 1.0000 | LOZENGE | OROMUCOSAL | Status: DC | PRN

## 2023-05-27 MED ORDER — ONDANSETRON HCL 4 MG/2ML IJ SOLN
INTRAMUSCULAR | Status: AC
  Filled 2023-05-27: qty 2

## 2023-05-27 MED ORDER — FENTANYL CITRATE PF 50 MCG/ML IJ SOSY
PREFILLED_SYRINGE | INTRAMUSCULAR | Status: AC
  Administered 2023-05-27: 50 ug via INTRAVENOUS
  Filled 2023-05-27: qty 2

## 2023-05-27 MED ORDER — CEFAZOLIN SODIUM-DEXTROSE 2-4 GM/100ML-% IV SOLN
2.0000 g | INTRAVENOUS | Status: AC
  Administered 2023-05-27: 2 g via INTRAVENOUS
  Filled 2023-05-27: qty 100

## 2023-05-27 MED ORDER — PROPOFOL 1000 MG/100ML IV EMUL
INTRAVENOUS | Status: AC
  Filled 2023-05-27: qty 100

## 2023-05-27 MED ORDER — PROPOFOL 500 MG/50ML IV EMUL
INTRAVENOUS | Status: AC
  Filled 2023-05-27: qty 50

## 2023-05-27 MED ORDER — SODIUM CHLORIDE 0.9% FLUSH
INTRAVENOUS | Status: DC | PRN
  Administered 2023-05-27: 30 mL

## 2023-05-27 MED ORDER — ACETAMINOPHEN 500 MG PO TABS
1000.0000 mg | ORAL_TABLET | Freq: Four times a day (QID) | ORAL | Status: AC
  Administered 2023-05-27 – 2023-05-28 (×4): 1000 mg via ORAL
  Filled 2023-05-27 (×4): qty 2

## 2023-05-27 MED ORDER — ACETAMINOPHEN 325 MG PO TABS: 325.0000 mg | ORAL_TABLET | Freq: Four times a day (QID) | ORAL | Status: DC | PRN

## 2023-05-27 MED ORDER — DEXAMETHASONE SODIUM PHOSPHATE 10 MG/ML IJ SOLN
INTRAMUSCULAR | Status: DC | PRN
  Administered 2023-05-27: 10 mg

## 2023-05-27 MED ORDER — APIXABAN 2.5 MG PO TABS
2.5000 mg | ORAL_TABLET | Freq: Two times a day (BID) | ORAL | Status: DC
  Administered 2023-05-28: 2.5 mg via ORAL
  Filled 2023-05-27: qty 1

## 2023-05-27 MED ORDER — DILTIAZEM HCL ER COATED BEADS 120 MG PO CP24
120.0000 mg | ORAL_CAPSULE | Freq: Every day | ORAL | Status: DC
  Administered 2023-05-28: 120 mg via ORAL
  Filled 2023-05-27: qty 1

## 2023-05-27 MED ORDER — METHOCARBAMOL 500 MG IVPB - SIMPLE MED: 500.0000 mg | Freq: Four times a day (QID) | INTRAVENOUS | Status: DC | PRN

## 2023-05-27 MED ORDER — ACETAMINOPHEN 500 MG PO TABS: 1000.0000 mg | ORAL_TABLET | Freq: Three times a day (TID) | ORAL | 0 refills | Status: AC | PRN

## 2023-05-27 MED ORDER — OXYCODONE HCL 5 MG PO TABS
5.0000 mg | ORAL_TABLET | ORAL | 0 refills | Status: AC | PRN
  Filled 2023-05-27: qty 40, 7d supply, fill #0

## 2023-05-27 MED ORDER — ALPRAZOLAM 0.25 MG PO TABS: 0.2500 mg | ORAL_TABLET | Freq: Every evening | ORAL | Status: DC | PRN

## 2023-05-27 MED ORDER — WATER FOR IRRIGATION, STERILE IR SOLN
Status: DC | PRN
  Administered 2023-05-27: 2000 mL

## 2023-05-27 MED ORDER — FENTANYL CITRATE (PF) 100 MCG/2ML IJ SOLN
INTRAMUSCULAR | Status: DC | PRN
  Administered 2023-05-27 (×2): 50 ug via INTRAVENOUS

## 2023-05-27 MED ORDER — PROPOFOL 10 MG/ML IV BOLUS
INTRAVENOUS | Status: DC | PRN
  Administered 2023-05-27 (×8): 20 mg via INTRAVENOUS
  Administered 2023-05-27: 30 mg via INTRAVENOUS

## 2023-05-27 MED ORDER — OXYCODONE HCL 5 MG PO TABS
5.0000 mg | ORAL_TABLET | ORAL | Status: DC | PRN
  Administered 2023-05-28: 5 mg via ORAL
  Filled 2023-05-27: qty 1

## 2023-05-27 MED ORDER — PHENOL 1.4 % MT LIQD: 1.0000 | OROMUCOSAL | Status: DC | PRN

## 2023-05-27 MED ORDER — FENTANYL CITRATE PF 50 MCG/ML IJ SOSY
25.0000 ug | PREFILLED_SYRINGE | INTRAMUSCULAR | Status: DC | PRN
  Administered 2023-05-27: 50 ug via INTRAVENOUS

## 2023-05-27 MED ORDER — METHOCARBAMOL 500 MG IVPB - SIMPLE MED
INTRAVENOUS | Status: AC
  Administered 2023-05-27: 500 mg via INTRAVENOUS
  Filled 2023-05-27: qty 55

## 2023-05-27 MED ORDER — SODIUM CHLORIDE 0.9 % IR SOLN
Status: DC | PRN
  Administered 2023-05-27: 3000 mL

## 2023-05-27 MED ORDER — DIPHENHYDRAMINE HCL 50 MG/ML IJ SOLN
INTRAMUSCULAR | Status: AC
  Filled 2023-05-27: qty 1

## 2023-05-27 MED ORDER — DIPHENHYDRAMINE HCL 50 MG/ML IJ SOLN
INTRAMUSCULAR | Status: DC | PRN
  Administered 2023-05-27: 12.5 mg via INTRAVENOUS

## 2023-05-27 MED ORDER — ONDANSETRON HCL 4 MG PO TABS
4.0000 mg | ORAL_TABLET | Freq: Three times a day (TID) | ORAL | 0 refills | Status: AC | PRN
  Filled 2023-05-27: qty 14, 5d supply, fill #0

## 2023-05-27 MED ORDER — POLYETHYLENE GLYCOL 3350 17 G PO PACK: 17.0000 g | PACK | Freq: Every day | ORAL | Status: DC | PRN

## 2023-05-27 MED ORDER — 0.9 % SODIUM CHLORIDE (POUR BTL) OPTIME
TOPICAL | Status: DC | PRN
  Administered 2023-05-27: 1000 mL

## 2023-05-27 MED ORDER — PROPOFOL 500 MG/50ML IV EMUL
INTRAVENOUS | Status: DC | PRN
  Administered 2023-05-27: 50 ug/kg/min via INTRAVENOUS

## 2023-05-27 SURGICAL SUPPLY — 65 items
ADH SKN CLS APL DERMABOND .7 (GAUZE/BANDAGES/DRESSINGS) ×1
APL PRP STRL LF DISP 70% ISPRP (MISCELLANEOUS) ×2
BAG COUNTER SPONGE SURGICOUNT (BAG) IMPLANT
BAG SPNG CNTER NS LX DISP (BAG) ×1
BLADE SAG 18X100X1.27 (BLADE) ×1 IMPLANT
BLADE SAW SAG 35X64 .89 (BLADE) ×1 IMPLANT
BNDG CMPR 5X3 CHSV STRCH STRL (GAUZE/BANDAGES/DRESSINGS) ×1
BNDG CMPR MED 10X6 ELC LF (GAUZE/BANDAGES/DRESSINGS) ×1
BNDG COHESIVE 3X5 TAN ST LF (GAUZE/BANDAGES/DRESSINGS) ×1 IMPLANT
BNDG ELASTIC 6X10 VLCR STRL LF (GAUZE/BANDAGES/DRESSINGS) ×1 IMPLANT
BOWL SMART MIX CTS (DISPOSABLE) ×1 IMPLANT
BSPLAT TIB 5D E CMNT STM LT (Knees) ×1 IMPLANT
CEMENT BONE R 1X40 (Cement) IMPLANT
CEMENT BONE REFOBACIN R1X40 US (Cement) IMPLANT
CHLORAPREP W/TINT 26 (MISCELLANEOUS) ×2 IMPLANT
CLSR STERI-STRIP ANTIMIC 1/2X4 (GAUZE/BANDAGES/DRESSINGS) IMPLANT
COMPONENT FEM CEMT SZ 10 LT (Joint) IMPLANT
COVER SURGICAL LIGHT HANDLE (MISCELLANEOUS) ×1 IMPLANT
CUFF TOURN SGL QUICK 34 (TOURNIQUET CUFF) ×1
CUFF TRNQT CYL 34X4.125X (TOURNIQUET CUFF) ×1 IMPLANT
DERMABOND ADVANCED .7 DNX12 (GAUZE/BANDAGES/DRESSINGS) ×1 IMPLANT
DRAPE INCISE IOBAN 85X60 (DRAPES) ×1 IMPLANT
DRAPE SHEET LG 3/4 BI-LAMINATE (DRAPES) ×1 IMPLANT
DRAPE U-SHAPE 47X51 STRL (DRAPES) ×1 IMPLANT
DRESSING AQUACEL AG SP 3.5X10 (GAUZE/BANDAGES/DRESSINGS) ×1 IMPLANT
DRSG AQUACEL AG SP 3.5X10 (GAUZE/BANDAGES/DRESSINGS) ×1
ELECT REM PT RETURN 15FT ADLT (MISCELLANEOUS) ×1 IMPLANT
GAUZE SPONGE 4X4 12PLY STRL (GAUZE/BANDAGES/DRESSINGS) ×1 IMPLANT
GLOVE BIO SURGEON STRL SZ 6.5 (GLOVE) ×2 IMPLANT
GLOVE BIOGEL PI IND STRL 6.5 (GLOVE) ×1 IMPLANT
GLOVE BIOGEL PI IND STRL 8 (GLOVE) ×1 IMPLANT
GLOVE SURG ORTHO 8.0 STRL STRW (GLOVE) ×2 IMPLANT
GOWN STRL REUS W/ TWL XL LVL3 (GOWN DISPOSABLE) ×2 IMPLANT
GOWN STRL REUS W/TWL XL LVL3 (GOWN DISPOSABLE) ×2
HANDPIECE INTERPULSE COAX TIP (DISPOSABLE) ×1
HOLDER FOLEY CATH W/STRAP (MISCELLANEOUS) ×1 IMPLANT
HOOD PEEL AWAY T7 (MISCELLANEOUS) ×3 IMPLANT
INSERT ARTISURF SZ 8-11 EF 13 (Insert) IMPLANT
KIT TURNOVER KIT A (KITS) IMPLANT
MANIFOLD NEPTUNE II (INSTRUMENTS) ×1 IMPLANT
MARKER SKIN DUAL TIP RULER LAB (MISCELLANEOUS) ×1 IMPLANT
NS IRRIG 1000ML POUR BTL (IV SOLUTION) ×1 IMPLANT
PACK TOTAL KNEE CUSTOM (KITS) ×1 IMPLANT
PIN DRILL HDLS TROCAR 75 4PK (PIN) IMPLANT
SCREW HEADED 33MM KNEE (MISCELLANEOUS) IMPLANT
SET HNDPC FAN SPRY TIP SCT (DISPOSABLE) ×1 IMPLANT
SOLUTION IRRIG SURGIPHOR (IV SOLUTION) IMPLANT
SPIKE FLUID TRANSFER (MISCELLANEOUS) ×1 IMPLANT
STEM POLY PAT PLY 32M KNEE (Knees) IMPLANT
STEM TIB ST PERS 14+30 (Stem) IMPLANT
STEM TIBIA 5 DEG SZ E L KNEE (Knees) IMPLANT
STRIP CLOSURE SKIN 1/2X4 (GAUZE/BANDAGES/DRESSINGS) ×1 IMPLANT
SUT MNCRL AB 3-0 PS2 18 (SUTURE) ×1 IMPLANT
SUT STRATAFIX 0 PDS 27 VIOLET (SUTURE) ×1
SUT STRATAFIX PDO 1 14 VIOLET (SUTURE) ×1
SUT STRATFX PDO 1 14 VIOLET (SUTURE) ×1
SUT VIC AB 2-0 CT2 27 (SUTURE) ×2 IMPLANT
SUTURE STRATFX 0 PDS 27 VIOLET (SUTURE) ×1 IMPLANT
SUTURE STRATFX PDO 1 14 VIOLET (SUTURE) ×1 IMPLANT
SYR 50ML LL SCALE MARK (SYRINGE) ×1 IMPLANT
TIBIA STEM 5 DEG SZ E L KNEE (Knees) ×1 IMPLANT
TRAY FOLEY MTR SLVR 14FR STAT (SET/KITS/TRAYS/PACK) IMPLANT
TUBE SUCTION HIGH CAP CLEAR NV (SUCTIONS) ×1 IMPLANT
UNDERPAD 30X36 HEAVY ABSORB (UNDERPADS AND DIAPERS) ×1 IMPLANT
WRAP KNEE MAXI GEL POST OP (GAUZE/BANDAGES/DRESSINGS) IMPLANT

## 2023-05-27 NOTE — Anesthesia Procedure Notes (Signed)
Spinal  Patient location during procedure: OR Start time: 05/27/2023 7:28 AM End time: 05/27/2023 7:31 AM Reason for block: surgical anesthesia Staffing Performed: anesthesiologist  Anesthesiologist: Elmer Picker, MD Performed by: Elmer Picker, MD Authorized by: Elmer Picker, MD   Preanesthetic Checklist Completed: patient identified, IV checked, risks and benefits discussed, surgical consent, monitors and equipment checked, pre-op evaluation and timeout performed Spinal Block Patient position: sitting Prep: DuraPrep and site prepped and draped Patient monitoring: cardiac monitor, continuous pulse ox and blood pressure Approach: midline Location: L3-4 Injection technique: single-shot Needle Needle type: Pencan  Needle gauge: 24 G Needle length: 9 cm Assessment Sensory level: T6 Events: CSF return Additional Notes Functioning IV was confirmed and monitors were applied. Sterile prep and drape, including hand hygiene and sterile gloves were used. The patient was positioned and the spine was prepped. The skin was anesthetized with lidocaine.  Free flow of clear CSF was obtained prior to injecting local anesthetic into the CSF.  The spinal needle aspirated freely following injection.  The needle was carefully withdrawn.  The patient tolerated the procedure well.

## 2023-05-27 NOTE — Transfer of Care (Signed)
Immediate Anesthesia Transfer of Care Note  Patient: Donna Ramsey  Procedure(s) Performed: TOTAL KNEE ARTHROPLASTY (Left: Knee)  Patient Location: PACU  Anesthesia Type:Spinal  Level of Consciousness: awake, alert , and oriented  Airway & Oxygen Therapy: Patient Spontanous Breathing and Patient connected to nasal cannula oxygen  Post-op Assessment: Report given to RN and Post -op Vital signs reviewed and stable  Post vital signs: Reviewed and stable  Last Vitals:  Vitals Value Taken Time  BP    Temp    Pulse    Resp    SpO2      Last Pain:  Vitals:   05/27/23 0557  TempSrc:   PainSc: 0-No pain         Complications: No notable events documented.

## 2023-05-27 NOTE — Anesthesia Postprocedure Evaluation (Signed)
Anesthesia Post Note  Patient: Donna Ramsey  Procedure(s) Performed: TOTAL KNEE ARTHROPLASTY (Left: Knee)     Patient location during evaluation: PACU Anesthesia Type: Regional and Spinal Level of consciousness: oriented and awake and alert Pain management: pain level controlled Vital Signs Assessment: post-procedure vital signs reviewed and stable Respiratory status: spontaneous breathing, respiratory function stable and patient connected to nasal cannula oxygen Cardiovascular status: blood pressure returned to baseline and stable Postop Assessment: no headache, no backache and no apparent nausea or vomiting Anesthetic complications: no  No notable events documented.  Last Vitals:  Vitals:   05/27/23 1055 05/27/23 1302  BP: 129/74 107/62  Pulse: 61 76  Resp: 19 15  Temp: 36.7 C 36.8 C  SpO2: 98% 100%    Last Pain:  Vitals:   05/27/23 1056  TempSrc:   PainSc: 4                  Donna Ramsey

## 2023-05-27 NOTE — Progress Notes (Signed)
Orthopedic Tech Progress Note Patient Details:  Donna Ramsey 1951/05/16 [id number]  Ortho Devices Type of Ortho Device: Bone foam zero knee Ortho Device/Splint Interventions: Ordered      [place] A Gerilyn Pilgrim 05/27/2023, 10:16 AM

## 2023-05-27 NOTE — Anesthesia Procedure Notes (Signed)
Anesthesia Regional Block: Adductor canal block   Pre-Anesthetic Checklist: , timeout performed,  Correct Patient, Correct Site, Correct Laterality,  Correct Procedure, Correct Position, site marked,  Risks and benefits discussed,  Pre-op evaluation,  At surgeon's request and post-op pain management  Laterality: Left  Prep: Maximum Sterile Barrier Precautions used, chloraprep       Needles:  Injection technique: Single-shot  Needle Type: Echogenic Stimulator Needle     Needle Length: 9cm  Needle Gauge: 21     Additional Needles:   Procedures:,,,, ultrasound used (permanent image in chart),,    Narrative:  Start time: 05/27/2023 7:10 AM End time: 05/27/2023 7:14 AM Injection made incrementally with aspirations every 5 mL. Anesthesiologist: Elmer Picker, MD

## 2023-05-27 NOTE — Discharge Instructions (Signed)
INSTRUCTIONS AFTER JOINT REPLACEMENT   Remove items at home which could result in a fall. This includes throw rugs or furniture in walking pathways ICE to the affected joint every three hours while awake for 30 minutes at a time, for at least the first 3-5 days, and then as needed for pain and swelling.  Continue to use ice for pain and swelling. You may notice swelling that will progress down to the foot and ankle.  This is normal after surgery.  Elevate your leg when you are not up walking on it.   Continue to use the breathing machine you got in the hospital (incentive spirometer) which will help keep your temperature down.  It is common for your temperature to cycle up and down following surgery, especially at night when you are not up moving around and exerting yourself.  The breathing machine keeps your lungs expanded and your temperature down.   DIET:  As you were doing prior to hospitalization, we recommend a well-balanced diet.  DRESSING / WOUND CARE / SHOWERING  Keep the surgical dressing until follow up.  The dressing is water proof, so you can shower without any extra covering.  IF THE DRESSING FALLS OFF or the wound gets wet inside, change the dressing with sterile gauze.  Please use good hand washing techniques before changing the dressing.  Do not use any lotions or creams on the incision until instructed by your surgeon.    ACTIVITY  Increase activity slowly as tolerated, but follow the weight bearing instructions below.   No driving for 6 weeks or until further direction given by your physician.  You cannot drive while taking narcotics.  No lifting or carrying greater than 10 lbs. until further directed by your surgeon. Avoid periods of inactivity such as sitting longer than an hour when not asleep. This helps prevent blood clots.  You may return to work once you are authorized by your doctor.     WEIGHT BEARING   Weight bearing as tolerated with assist device (walker, cane,  etc) as directed, use it as long as suggested by your surgeon or therapist, typically at least 4-6 weeks.   EXERCISES  Results after joint replacement surgery are often greatly improved when you follow the exercise, range of motion and muscle strengthening exercises prescribed by your doctor. Safety measures are also important to protect the joint from further injury. Any time any of these exercises cause you to have increased pain or swelling, decrease what you are doing until you are comfortable again and then slowly increase them. If you have problems or questions, call your caregiver or physical therapist for advice.   Rehabilitation is important following a joint replacement. After just a few days of immobilization, the muscles of the leg can become weakened and shrink (atrophy).  These exercises are designed to build up the tone and strength of the thigh and leg muscles and to improve motion. Often times heat used for twenty to thirty minutes before working out will loosen up your tissues and help with improving the range of motion but do not use heat for the first two weeks following surgery (sometimes heat can increase post-operative swelling).   These exercises can be done on a training (exercise) mat, on the floor, on a table or on a bed. Use whatever works the best and is most comfortable for you.    Use music or television while you are exercising so that the exercises are a pleasant break in your   day. This will make your life better with the exercises acting as a break in your routine that you can look forward to.   Perform all exercises about fifteen times, three times per day or as directed.  You should exercise both the operative leg and the other leg as well.  Exercises include:   Quad Sets - Tighten up the muscle on the front of the thigh (Quad) and hold for 5-10 seconds.   Straight Leg Raises - With your knee straight (if you were given a brace, keep it on), lift the leg to 60  degrees, hold for 3 seconds, and slowly lower the leg.  Perform this exercise against resistance later as your leg gets stronger.  Leg Slides: Lying on your back, slowly slide your foot toward your buttocks, bending your knee up off the floor (only go as far as is comfortable). Then slowly slide your foot back down until your leg is flat on the floor again.  Angel Wings: Lying on your back spread your legs to the side as far apart as you can without causing discomfort.  Hamstring Strength:  Lying on your back, push your heel against the floor with your leg straight by tightening up the muscles of your buttocks.  Repeat, but this time bend your knee to a comfortable angle, and push your heel against the floor.  You may put a pillow under the heel to make it more comfortable if necessary.   A rehabilitation program following joint replacement surgery can speed recovery and prevent re-injury in the future due to weakened muscles. Contact your doctor or a physical therapist for more information on knee rehabilitation.    CONSTIPATION  Constipation is defined medically as fewer than three stools per week and severe constipation as less than one stool per week.  Even if you have a regular bowel pattern at home, your normal regimen is likely to be disrupted due to multiple reasons following surgery.  Combination of anesthesia, postoperative narcotics, change in appetite and fluid intake all can affect your bowels.   YOU MUST use at least one of the following options; they are listed in order of increasing strength to get the job done.  They are all available over the counter, and you may need to use some, POSSIBLY even all of these options:    Drink plenty of fluids (prune juice may be helpful) and high fiber foods Colace 100 mg by mouth twice a day  Senokot for constipation as directed and as needed Dulcolax (bisacodyl), take with full glass of water  Miralax (polyethylene glycol) once or twice a day as  needed.  If you have tried all these things and are unable to have a bowel movement in the first 3-4 days after surgery call either your surgeon or your primary doctor.    If you experience loose stools or diarrhea, hold the medications until you stool forms back up.  If your symptoms do not get better within 1 week or if they get worse, check with your doctor.  If you experience "the worst abdominal pain ever" or develop nausea or vomiting, please contact the office immediately for further recommendations for treatment.   ITCHING:  If you experience itching with your medications, try taking only a single pain pill, or even half a pain pill at a time.  You can also use Benadryl over the counter for itching or also to help with sleep.   TED HOSE STOCKINGS:  Use stockings on both  legs until for at least 2 weeks or as directed by physician office. They may be removed at night for sleeping.  MEDICATIONS:  See your medication summary on the "After Visit Summary" that nursing will review with you.  You may have some home medications which will be placed on hold until you complete the course of blood thinner medication.  It is important for you to complete the blood thinner medication as prescribed.   Blood clot prevention (DVT Prophylaxis): After surgery you are at an increased risk for a blood clot. you were prescribed a blood thinner, eliquis, to be taken daily for a total of 4 weeks from surgery to help reduce your risk of getting a blood clot. This will help prevent a blood clot. Signs of a pulmonary embolus (blood clot in the lungs) include sudden short of breath, feeling lightheaded or dizzy, chest pain with a deep breath, rapid pulse rapid breathing. Signs of a blood clot in your arms or legs include new unexplained swelling and cramping, warm, red or darkened skin around the painful area. Please call the office or 911 right away if these signs or symptoms develop.  PRECAUTIONS:  If you experience  chest pain or shortness of breath - call 911 immediately for transfer to the hospital emergency department.   If you develop a fever greater that 101 F, purulent drainage from wound, increased redness or drainage from wound, foul odor from the wound/dressing, or calf pain - CONTACT YOUR SURGEON.                                                   FOLLOW-UP APPOINTMENTS:  If you do not already have a post-op appointment, please call the office for an appointment to be seen by your surgeon.  Guidelines for how soon to be seen are listed in your "After Visit Summary", but are typically between 2-3 weeks after surgery.   POST-OPERATIVE OPIOID TAPER INSTRUCTIONS: It is important to wean off of your opioid medication as soon as possible. If you do not need pain medication after your surgery it is ok to stop day one. Opioids include: Codeine, Hydrocodone(Norco, Vicodin), Oxycodone(Percocet, oxycontin) and hydromorphone amongst others.  Long term and even short term use of opiods can cause: Increased pain response Dependence Constipation Depression Respiratory depression And more.  Withdrawal symptoms can include Flu like symptoms Nausea, vomiting And more Techniques to manage these symptoms Hydrate well Eat regular healthy meals Stay active Use relaxation techniques(deep breathing, meditating, yoga) Do Not substitute Alcohol to help with tapering If you have been on opioids for less than two weeks and do not have pain than it is ok to stop all together.  Plan to wean off of opioids This plan should start within one week post op of your joint replacement. Maintain the same interval or time between taking each dose and first decrease the dose.  Cut the total daily intake of opioids by one tablet each day Next start to increase the time between doses. The last dose that should be eliminated is the evening dose.   MAKE SURE YOU:  Understand these instructions.  Get help right away if you are  not doing well or get worse.    Thank you for letting us be a part of your medical care team.  It is a privilege we respect  greatly.  We hope these instructions will help you stay on track for a fast and full recovery!

## 2023-05-27 NOTE — Evaluation (Signed)
Physical Therapy Evaluation Patient Details Name: Donna Ramsey MRN: [id number] DOB: 1950/12/18 Today's Date: 05/27/2023  History of Present Illness  72 yo female s/p L TKA on 05/27/23. PMH: peroneal tendonitis, fibula fx  Clinical Impression  Pt is s/p TKA resulting in the deficits listed below (see PT Problem List).   Pt is quite pleasant and motivated; amb ~ 87' with RW and min assist. 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 Dtr and other family members assisting after d/c home  Pt will benefit from acute skilled PT to increase their independence and safety with mobility to allow discharge.         Recommendations for follow up therapy are one component of a multi-disciplinary discharge planning process, led by the attending physician.  Recommendations may be updated based on patient status, additional functional criteria and insurance authorization.  Follow Up Recommendations       Assistance Recommended at Discharge Intermittent Supervision/Assistance  Patient can return home with the following  Assist for transportation;Help with stairs or ramp for entrance;Assistance with cooking/housework    Equipment Recommendations Rolling walker (2 wheels)  Recommendations for Other Services       Functional Status Assessment Patient has had a recent decline in their functional status and demonstrates the ability to make significant improvements in function in a reasonable and predictable amount of time.     Precautions / Restrictions Precautions Precautions: Fall;Knee Restrictions Weight Bearing Restrictions: No Other Position/Activity Restrictions: WBAT      Mobility  Bed Mobility Overal bed mobility: Needs Assistance Bed Mobility: Supine to Sit     Supine to sit: Supervision     General bed mobility comments: for safety    Transfers Overall transfer level: Needs assistance Equipment used: Rolling walker (2  wheels) Transfers: Sit to/from Stand Sit to Stand: Min assist           General transfer comment: light assist to rise and steady    Ambulation/Gait Ambulation/Gait assistance: Min guard Gait Distance (Feet): 80 Feet Assistive device: Rolling walker (2 wheels) Gait Pattern/deviations: Step-to pattern       General Gait Details: verbal cues for sequence and proximity to [employer] Mobility    Modified Rankin (Stroke Patients Only)       Balance Overall balance assessment: Needs assistance Sitting-balance support: Feet supported, No upper extremity supported Sitting balance-Leahy Scale: Good     Standing balance support: Reliant on assistive device for balance, During functional activity Standing balance-Leahy Scale: Fair Standing balance comment: able to static stand with close supervision                             Pertinent Vitals/Pain Pain Assessment Pain Assessment: 0-10 Pain Score: 3  Pain Location: L knee Pain Descriptors / Indicators: Aching, Discomfort Pain Intervention(s): Limited activity within patient's tolerance, Monitored during session, Premedicated before session, Repositioned    Home Living Family/patient expects to be discharged to:: Private residence Living Arrangements: Alone Available Help at Discharge: Family;Available 24 hours/day   Home Access: Stairs to enter Entrance Stairs-Rails: Right Entrance Stairs-Number of Steps: 3   Home Layout: One level Home Equipment: Cane - single Librarian, academic (2 wheels) Additional Comments: dtr, son, grandson and friends assisting    Prior Function Prior Level of Function : Independent/Modified Independent  Hand Dominance        Extremity/Trunk Assessment   Upper Extremity Assessment Upper Extremity Assessment: Overall WFL for tasks assessed    Lower Extremity Assessment Lower Extremity Assessment: LLE  deficits/detail LLE Deficits / Details: ankle WFL, knee extension and hip flexion 3/5       Communication   Communication: No difficulties  Cognition Arousal/Alertness: Awake/alert Behavior During Therapy: WFL for tasks assessed/performed Overall Cognitive Status: Within Functional Limits for tasks assessed                                          General Comments      Exercises     Assessment/Plan    PT Assessment Patient needs continued PT services  PT Problem List Decreased strength;Decreased range of motion;Decreased activity tolerance;Decreased mobility;Decreased knowledge of precautions;Decreased balance;Decreased knowledge of use of DME;Pain       PT Treatment Interventions DME instruction;Therapeutic exercise;Gait training;Functional mobility training;Therapeutic activities;Patient/family education;Stair training    PT Goals (Current goals can be found in the Care Plan section)  Acute Rehab PT Goals Patient Stated Goal: back to walking without pain PT Goal Formulation: With patient Time For Goal Achievement: 06/03/23 Potential to Achieve Goals: Good    Frequency 7X/week     Co-evaluation               AM-PAC PT "6 Clicks" Mobility  Outcome Measure Help needed turning from your back to your side while in a flat bed without using bedrails?: A Little Help needed moving from lying on your back to sitting on the side of a flat bed without using bedrails?: A Little Help needed moving to and from a bed to a chair (including a wheelchair)?: A Little Help needed standing up from a chair using your arms (e.g., wheelchair or bedside chair)?: A Little Help needed to walk in hospital room?: A Little Help needed climbing 3-5 steps with a railing? : A Little 6 Click Score: 18    End of Session Equipment Utilized During Treatment: Gait belt Activity Tolerance: Patient tolerated treatment well Patient left: with call bell/phone within reach;in  chair;with chair alarm set Nurse Communication: Mobility status PT Visit Diagnosis: Other abnormalities of gait and mobility (R26.89);Difficulty in walking, not elsewhere classified (R26.2)    Time: 1610-9604 PT Time Calculation (min) (ACUTE ONLY): 26 min   Charges:   PT Evaluation $PT Eval Low Complexity: 1 Low PT Treatments $Gait Training: 8-22 mins        Delice Bison, PT  Acute Rehab Dept [facility]) [phone]  05/27/2023   [facility] 05/27/2023, 3:25 PM

## 2023-05-27 NOTE — Op Note (Signed)
DATE OF SURGERY:  05/27/2023 TIME: 9:17 AM  PATIENT NAME:  Donna Ramsey   AGE: 72 y.o.   PRE-OPERATIVE DIAGNOSIS: End-stage left knee osteoarthritis  POST-OPERATIVE DIAGNOSIS:  Same  PROCEDURE: Left total Knee Arthroplasty  SURGEON:  Keino Placencia A Martino Tompson, MD   ASSISTANT: Kathie Dike, PA-C, present and scrubbed throughout the case, critical for assistance with exposure, retraction, instrumentation, and closure.   OPERATIVE IMPLANTS:  Cemented Zimmer persona size 10 narrow femur left, E tibial baseplate with 30 mm stem extension, 13 mm MC poly insert, 32 mm patella Implant Name Type Inv. Item Serial No. Manufacturer Lot No. LRB No. Used Action  CEMENT BONE R 1X40 - [id number] Cement CEMENT BONE R 1X40  ZIMMER RECON(ORTH,TRAU,BIO,SG) [id number] Left 2 Implanted  STEM POLY PAT PLY 33M KNEE - [id number] Knees STEM POLY PAT PLY 33M KNEE  ZIMMER RECON(ORTH,TRAU,BIO,SG) [id number] Left 1 Implanted  STEM TIB ST PERS 14+30 - [id number] Stem STEM TIB ST PERS 14+30  ZIMMER [employer]ON(ORTH,TRAU,BIO,SG) [id number] Left 1 Implanted  COMPONENT FEM CEMT SZ 10 LT - [id number] Joint COMPONENT FEM CEMT SZ 10 LT  ZIMMER RECON(ORTH,TRAU,BIO,SG) [id number] Left 1 Implanted  TIBIA STEM 5 DEG SZ E L KNEE - [id number] Knees TIBIA STEM 5 DEG SZ E L KNEE  ZIMMER RECON(ORTH,TRAU,BIO,SG) [id number] Left 1 Implanted  INSERT ARTISURF SZ 8-11 EF 13 - [id number] Insert INSERT ARTISURF SZ 8-11 EF 13  ZIMMER RECON(ORTH,TRAU,BIO,SG) [id number] Left 1 Implanted      PREOPERATIVE INDICATIONS:  Donna Ramsey is a 72 y.o. year old female with end stage bone on bone degenerative arthritis of the knee who failed conservative treatment, including injections, antiinflammatories, activity modification, and assistive devices, and had significant impairment of their activities of daily living, and elected for Total Knee Arthroplasty.   The risks, benefits, and alternatives were discussed at length including but not limited to the risks  of infection, bleeding, nerve injury, stiffness, blood clots, the need for revision surgery, cardiopulmonary complications, among others, and they were willing to proceed.  ESTIMATED BLOOD LOSS: 25cc  OPERATIVE DESCRIPTION:   Once adequate anesthesia was induced, preoperative antibiotics, 2 gm of ancef,1 gm of Tranexamic Acid, and 8 mg of Decadron administered, the patient was positioned supine with a left thigh tourniquet placed.  The left lower extremity was prepped and draped in sterile fashion.  A time-  out was performed identifying the patient, planned procedure, and the appropriate extremity.     The leg was  exsanguinated, tourniquet elevated to 250 mmHg.  A midline incision was  made followed by median parapatellar arthrotomy. Anterior horn of the medial meniscus was released and resected. A medial release was performed, the infrapatellar fat pad was resected with care taken to protect the patellar tendon. The suprapatellar fat was removed to exposed the distal anterior femur. The anterior horn of the lateral meniscus and ACL were released.    Following initial  exposure, I first started with the femur  The femoral  canal was opened with a drill, canal was suctioned to try to prevent fat emboli.  An  intramedullary rod was passed set at 5 degrees valgus, 10mm. The distal femur was resected.  Following this resection, the tibia was  subluxated anteriorly.  Using the extramedullary guide, 10 mm of bone was resected off   the proximal lateral tibia.  We confirmed the gap would be  stable medially and laterally with a size 10mm spacer block as well as confirmed that the tibial cut was perpendicular in the  coronal plane, checking with an alignment rod.    Once this was done, the posterior femoral referencing femoral sizer was placed under to the posterior condyles with 3 degrees of external rotational which was parallel to the transepicondylar axis and perpendicular to [employer]. The femur was  sized to be a size 10 in the anterior-  posterior dimension. The  anterior, posterior, and  chamfer cuts were made without difficulty nor   notching making certain that I was along the anterior cortex to help  with flexion gap stability. Next a laminar spreader was placed with the knee in flexion and the medial lateral menisci were resected.  5 cc of the Exparel mixture was injected in the medial side of the back of the knee and 3 cc in the lateral side.  1/2 inch curved osteotome was used to resect posterior osteophyte that was then removed with a pituitary rongeur.       At this point, the tibia was sized to be a size E.  The size E tray was  then pinned in position. Trial reduction was now carried with a 10 femur, E tibia, a 10 mm MC insert.  The knee was symmetrically loose in flexion and extension upsized to a 12 mm poly.  The knee was tight laterally in extension so I used a 15 blade to piecrust the IT band to help improve extension balance.  The knee had full extension and was stable to varus valgus stress in extension.  The knee was slightly tight in flexion and the PCL was partially released.   Attention was next directed to the patella.  Precut  measurement was noted to be 19 mm.  I resected down to 13 mm and used a  32mm patellar button to restore patellar height as well as cover the cut surface.     The patella lug holes were drilled and a 32mm patella poly trial was placed.    The knee was brought to full extension with good flexion stability with the patella tracking through the trochlea without application of pressure.     Next the femoral component was again assessed and determined to be seated and appropriately lateralized.  The femoral lug holes were drilled.  The femoral component was then removed. Tibial component was again assessed and felt to be seated and appropriately rotated with the medial third of the tubercle. The tibia was then drilled, and keel punched.     Final  components were  opened and antibiotic cement was mixed.      Final implants were then  cemented onto cleaned and dried cut surfaces of bone with the knee brought to extension with a 12 mm MC poly.  The knee was irrigated with sterile Betadine diluted in saline as well as pulse lavage normal saline. The synovial lining was  then injected a dilute Exparel.      Once the cement had fully cured, excess cement was removed throughout the knee.  There was still some laxity on the medial side in extension so upsized to a 13 mm poly which had improved stability.  I confirmed that I was satisfied with the range of motion and stability, and the final 13 mm MC poly insert was chosen.  It was placed into the knee.         The tourniquet had been let down at 60 minutes.  No significant hemostasis was required.  The medial parapatellar arthrotomy was then reapproximated using #1 Stratafix sutures with  the knee  in flexion.  The remaining wound was closed with 0 stratafix, 2-0 Vicryl, and running 3-0 Monocryl. The knee was cleaned, dried, dressed sterilely using Dermabond and   Aquacel dressing.  The patient was then brought to recovery room in stable condition, tolerating the procedure  well. There were no complications.   Post op recs: WB: WBAT Abx: ancef Imaging: PACU xrays DVT prophylaxis: Eliquis 2.5 mg twice daily postop day 1-2 then resume Eliquis 5 mg twice daily starting postop day 3 Follow up: 2 weeks after surgery for a wound check with Dr. Blanchie Dessert at [facility].  Address: [address], [place], [place] [address]  Office Phone: [phone]  Weber Cooks, MD Orthopaedic Surgery

## 2023-05-27 NOTE — Interval H&P Note (Signed)

## 2023-05-27 NOTE — TOC Transition Note (Signed)
Transition of Care [facility]) - CM/SW Discharge Note   Patient Details  Name: Donna Ramsey MRN: [id number] Date of Birth: 10/31/1951  [facility] CM/SW Contact:  Amada Jupiter, LCSW Phone Number: 05/27/2023, 1:13 PM   Clinical Narrative:     Met with pt who confirms she has needed DME in the home.  OPPT already arranged with [facility].   No TOC needs.  Final next level of care: OP Rehab Barriers to Discharge: No Barriers Identified   Patient Goals and CMS Choice      Discharge Placement                         Discharge Plan and Services Additional resources added to the After Visit Summary for                  DME Arranged: N/A DME Agency: NA                  Social Determinants of Health (SDOH) Interventions SDOH Screenings   Food Insecurity: No Food Insecurity (05/27/2023)  Housing: Low Risk  (05/27/2023)  Transportation Needs: No Transportation Needs (05/27/2023)  Utilities: Not At Risk (05/27/2023)  Alcohol Screen: Low Risk  (10/19/2022)  Depression (PHQ2-9): Low Risk  (10/19/2022)  Financial Resource Strain: Low Risk  (10/19/2022)  Physical Activity: Sufficiently Active (10/19/2022)  Social Connections: Moderately Integrated (10/19/2022)  Stress: No Stress Concern Present (10/19/2022)  Tobacco Use: Low Risk  (05/27/2023)     Readmission Risk Interventions     No data to display

## 2023-05-28 ENCOUNTER — Encounter (HOSPITAL_COMMUNITY): Payer: Self-pay | Admitting: Orthopedic Surgery

## 2023-05-28 DIAGNOSIS — I10 Essential (primary) hypertension: Secondary | ICD-10-CM | POA: Diagnosis not present

## 2023-05-28 DIAGNOSIS — Z7901 Long term (current) use of anticoagulants: Secondary | ICD-10-CM | POA: Diagnosis not present

## 2023-05-28 DIAGNOSIS — Z79899 Other long term (current) drug therapy: Secondary | ICD-10-CM | POA: Diagnosis not present

## 2023-05-28 DIAGNOSIS — J45909 Unspecified asthma, uncomplicated: Secondary | ICD-10-CM | POA: Diagnosis not present

## 2023-05-28 DIAGNOSIS — I480 Paroxysmal atrial fibrillation: Secondary | ICD-10-CM | POA: Diagnosis not present

## 2023-05-28 DIAGNOSIS — M1712 Unilateral primary osteoarthritis, left knee: Secondary | ICD-10-CM | POA: Diagnosis not present

## 2023-05-28 LAB — CBC
HCT: 33.9 % — ABNORMAL LOW (ref 36.0–46.0)
Hemoglobin: 12 g/dL (ref 12.0–15.0)
MCH: 33.9 pg (ref 26.0–34.0)
MCHC: 35.4 g/dL (ref 30.0–36.0)
MCV: 95.8 fL (ref 80.0–100.0)
Platelets: 275 10*3/uL (ref 150–400)
RBC: 3.54 MIL/uL — ABNORMAL LOW (ref 3.87–5.11)
RDW: 13.9 % (ref 11.5–15.5)
WBC: 11.7 10*3/uL — ABNORMAL HIGH (ref 4.0–10.5)
nRBC: 0 % (ref 0.0–0.2)

## 2023-05-28 LAB — BASIC METABOLIC PANEL
Anion gap: 7 (ref 5–15)
BUN: 12 mg/dL (ref 8–23)
CO2: 23 mmol/L (ref 22–32)
Calcium: 8.9 mg/dL (ref 8.9–10.3)
Chloride: 104 mmol/L (ref 98–111)
Creatinine, Ser: 0.42 mg/dL — ABNORMAL LOW (ref 0.44–1.00)
GFR, Estimated: >60 mL/min (ref >60)
Glucose, Bld: 137 mg/dL — ABNORMAL HIGH (ref 70–99)
Potassium: 4.3 mmol/L (ref 3.5–5.1)
Sodium: 134 mmol/L — ABNORMAL LOW (ref 135–145)

## 2023-05-28 MED ORDER — APIXABAN 5 MG PO TABS: 5.0000 mg | ORAL_TABLET | Freq: Two times a day (BID) | ORAL | 3 refills | Status: DC

## 2023-05-28 NOTE — Progress Notes (Signed)
     Subjective:  Patient reports pain as mild.  Did great with physical therapy yesterday.  Plan for additional therapy today then likely discharge home.  Objective:   VITALS:   Vitals:   05/27/23 1302 05/27/23 1712 05/27/23 2237 05/28/23 0142  BP: 107/62 110/67 105/60 111/72  Pulse: 76 74 69 74  Resp: 15 16 16 16   Temp: 98.2 F (36.8 C) 98 F (36.7 C) 98.4 F (36.9 C) 97.9 F (36.6 C)  TempSrc:  Oral Oral Oral  SpO2: 100% 100% 100% 99%  Weight:      Height:        Sensation intact distally Intact pulses distally Dorsiflexion/Plantar flexion intact Incision: dressing C/D/I Compartment soft    Lab Results  Component Value Date   WBC 11.7 (H) 05/28/2023   HGB 12.0 05/28/2023   HCT 33.9 (L) 05/28/2023   MCV 95.8 05/28/2023   PLT 275 05/28/2023   BMET    Component Value Date/Time   NA 134 (L) 05/28/2023 0328   K 4.3 05/28/2023 0328   CL 104 05/28/2023 0328   CO2 23 05/28/2023 0328   GLUCOSE 137 (H) 05/28/2023 0328   BUN 12 05/28/2023 0328   CREATININE 0.42 (L) 05/28/2023 0328   CALCIUM 8.9 05/28/2023 0328   GFRNONAA >60 05/28/2023 0328   Xray: Total knee arthroplasty components in good position no adverse features  Assessment/Plan: 1 Day Post-Op   Principal Problem:   Primary osteoarthritis of left knee  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 Blanchie Dessert at [facility].  Address: [address], [place], [place] [address]  Office Phone: [phone]   Joen Laura 05/28/2023, 6:45 AM   Weber Cooks, MD  Contact information:   [phone] 7am-5pm epic message Dr. Blanchie Dessert, or call office for patient follow up: [phone] After hours and holidays please check  [email or web address] for group call information for [facility]

## 2023-05-28 NOTE — Progress Notes (Signed)
PT TX NOTE  05/28/23 1300  PT Visit Information  Last PT Received On 05/28/23  Assistance Needed Pt is making excellent progress, meeting PT goals and feels ready to d/c home today. PT in agreement with plan. See below for areas reviewed. RN aware of d/c readiness.  History of Present Illness 72 yo female s/p L TKA on 05/27/23. PMH: peroneal tendonitis, fibula fx  Subjective Data  Patient Stated Goal back to walking without pain  Precautions  Precautions Fall;Knee  Precaution Booklet Issued No  Restrictions  Weight Bearing Restrictions No  Other Position/Activity Restrictions WBAT  Pain Assessment  Pain Assessment 0-10  Pain Score 4  Pain Location L knee  Pain Descriptors / Indicators Aching;Discomfort  Pain Intervention(s) Limited activity within patient's tolerance;Premedicated before session;Monitored during session;Repositioned;Ice applied  Cognition  Arousal/Alertness Awake/alert  Behavior During Therapy WFL for tasks assessed/performed  Overall Cognitive Status Within Functional Limits for tasks assessed  Bed Mobility  Overal bed mobility Needs Assistance  Bed Mobility Supine to Sit;Sit to Supine  Supine to sit Modified independent (Device/Increase time)  Sit to supine Modified independent (Device/Increase time)  Transfers  Equipment used Rolling walker (2 wheels)  Transfers Sit to/from Stand  Sit to Stand Supervision  General transfer comment cues for hand placement  Ambulation/Gait  Ambulation/Gait assistance Min guard;Supervision  Gait Distance (Feet) 160 Feet  Assistive device Rolling walker (2 wheels)  Gait Pattern/deviations Step-to pattern;Step-through pattern;Decreased stride length;Decreased stance time - left  General Gait Details consistent step through pattern with godo stability, no LOB, nO incr pain  Stairs Yes  Stairs assistance Min guard  Stair Management One rail Right;One rail Left;Step to pattern;Forwards  Number of Stairs 5 (x2)  General stair  comments cues for sequence and technique, min/guard for safety; no LOB; able to perform with unilateral/single rail UE support without LOB  Balance  Sitting-balance support Feet supported;No upper extremity supported  Sitting balance-Leahy Scale Good  Standing balance support Reliant on assistive device for balance;During functional activity  Standing balance-Leahy Scale Fair  Standing balance comment able to static stand with close supervision  PT - End of Session  Equipment Utilized During Treatment Gait belt  Activity Tolerance Patient tolerated treatment well  Patient left with call bell/phone within reach;in chair;with chair alarm set  Nurse Communication Mobility status   PT - Assessment/Plan  PT Plan Current plan remains appropriate  PT Visit Diagnosis Other abnormalities of gait and mobility (R26.89);Difficulty in walking, not elsewhere classified (R26.2)  PT Frequency (ACUTE ONLY) 7X/week  Follow Up Recommendations Follow physician's recommendations for discharge plan and follow up therapies  Assistance recommended at discharge Intermittent Supervision/Assistance  Patient can return home with the following Assist for transportation;Help with stairs or ramp for entrance;Assistance with cooking/housework  PT equipment Rolling walker (2 wheels)  AM-PAC PT "6 Clicks" Mobility Outcome Measure (Version 2)  Help needed turning from your back to your side while in a flat bed without using bedrails? 3  Help needed moving from lying on your back to sitting on the side of a flat bed without using bedrails? 4  Help needed moving to and from a bed to a chair (including a wheelchair)? 3  Help needed standing up from a chair using your arms (e.g., wheelchair or bedside chair)? 3  Help needed to walk in hospital room? 3  Help needed climbing 3-5 steps with a railing?  3  6 Click Score 19  Consider Recommendation of Discharge To: Home with [facility]  Acute Rehab PT  Goals  PT Goal Formulation With  patient  Time For Goal Achievement 06/03/23  Potential to Achieve Goals Good  PT Time Calculation  PT Start Time (ACUTE ONLY) 1232  PT Stop Time (ACUTE ONLY) 1250  PT Time Calculation (min) (ACUTE ONLY) 18 min  PT General Charges  $$ ACUTE PT VISIT 1 Visit  PT Treatments  $Gait Training 8-22 mins

## 2023-05-28 NOTE — Progress Notes (Signed)
Patient discharged to home w/ family. Given all belongings, instructions. Verbalized understanding of all instructions. Escorted to pov via w/c. 

## 2023-05-28 NOTE — Discharge Summary (Addendum)
Physician Discharge Summary  Patient ID: Donna Ramsey MRN: [id number] DOB/AGE: 1951/01/07 72 y.o.  Admit date: 05/27/2023 Discharge date: 05/28/2023  Admission Diagnoses:  Primary osteoarthritis of left knee  Discharge Diagnoses:  Principal Problem:   Primary osteoarthritis of left knee   Past Medical History:  Diagnosis Date   A-fib (HCC)    Arthritis    Asplenia    Asthma    as a child   Family history of breast cancer    Hypertension    Liver disease, unspecified    heterogenous echotexture   Pneumonia    PONV (postoperative nausea and vomiting)    Portal hypertension (HCC)    Sleep apnea    mild  no cpap   Splenic infarct     Surgeries: Procedure(s): TOTAL KNEE ARTHROPLASTY on 05/27/2023   Consultants (if any):   Discharged Condition: Improved  Hospital Course: Donna Ramsey is an 72 y.o. female who was admitted 05/27/2023 with a diagnosis of Primary osteoarthritis of left knee and went to the operating room on 05/27/2023 and underwent the above named procedures.    She was given perioperative antibiotics:  Anti-infectives (From admission, onward)    Start     Dose/Rate Route Frequency Ordered Stop   05/27/23 1400  ceFAZolin (ANCEF) IVPB 2g/100 mL premix        2 g 200 mL/hr over 30 Minutes Intravenous Every 6 hours 05/27/23 1054 05/27/23 2035   05/27/23 0600  ceFAZolin (ANCEF) IVPB 2g/100 mL premix        2 g 200 mL/hr over 30 Minutes Intravenous On call to O.R. 05/27/23 1308 05/27/23 0742     .  She was given sequential compression devices, early ambulation, and Eliquis for DVT prophylaxis.  She benefited maximally from the hospital stay and there were no complications.    Recent vital signs:  Vitals:   05/28/23 0142 05/28/23 0650  BP: 111/72 122/66  Pulse: 74   Resp: 16 16  Temp: 97.9 F (36.6 C) 98.4 F (36.9 C)  SpO2: 99% 94%    Recent laboratory studies:  Lab Results  Component Value Date   HGB 12.0 05/28/2023   HGB 14.2  05/14/2023   HGB 14.6 01/17/2023   Lab Results  Component Value Date   WBC 11.7 (H) 05/28/2023   PLT 275 05/28/2023   No results found for: "INR" Lab Results  Component Value Date   NA 134 (L) 05/28/2023   K 4.3 05/28/2023   CL 104 05/28/2023   CO2 23 05/28/2023   BUN 12 05/28/2023   CREATININE 0.42 (L) 05/28/2023   GLUCOSE 137 (H) 05/28/2023    Discharge Medications:   Allergies as of 05/28/2023       Reactions   Clindamycin Hives   Codeine Rash   Pentazocine Hives   Sulfamethoxazole-trimethoprim Itching, Rash   2017 Reaction: rash, itching 03/16/2021: faint rash and itching 30 min after bactrim 400/80 oral challenge.    Doxycycline Other (See Comments)   Itching without rash near end of course   Levofloxacin Rash, Other (See Comments)   tendonitis   Neomycin Itching   Sulfa Antibiotics Rash        Medication List     STOP taking these medications    traMADol 50 MG tablet Commonly known as: ULTRAM       TAKE these medications    acetaminophen 500 MG tablet Commonly known as: TYLENOL Take 2 tablets (1,000 mg total) by mouth every 8 (eight) hours  as needed.   ALPRAZolam 0.25 MG tablet Commonly known as: XANAX Take 1 tablet (0.25 mg total) by mouth at bedtime as needed for anxiety.   apixaban 5 MG Tabs tablet Commonly known as: ELIQUIS Take 1 tablet (5 mg total) by mouth 2 (two) times daily. Take 2.5mg  twice daily on 6/11 and 6/12. Resume 5mg  twice daily on 6/13. What changed: additional instructions   b complex vitamins capsule Take 1 capsule by mouth daily.   CHLORELLA-SPIRULINA COMPLEX PO Take 6 tablets by mouth daily.   diltiazem 120 MG 24 hr capsule Commonly known as: Cardizem CD Take 1 capsule (120 mg total) by mouth daily.   diltiazem 30 MG tablet Commonly known as: Cardizem Take 1 tablet every 4 hours AS NEEDED for AFIB heart rate >100 as long as top BP >100.   FISH OIL PO Take 640 mg by mouth 2 (two) times daily.   GLYCINE  PO Take 3,000 mg by mouth at bedtime.   lidocaine 4 % Place 1 patch onto the skin daily as needed (pain).   MAGNESIUM PO Take 280 mg by mouth at bedtime.   meloxicam 7.5 MG tablet Commonly known as: Mobic Take 1 tablet (7.5 mg total) by mouth daily.   methocarbamol 500 MG tablet Commonly known as: ROBAXIN Take 1 tablet (500 mg total) by mouth every 8 (eight) hours as needed for up to 10 days for muscle spasms.   NAC 500 MG Caps Generic drug: Acetylcysteine Take 500 mg by mouth 2 (two) times daily.   ondansetron 4 MG tablet Commonly known as: Zofran Take 1 tablet (4 mg total) by mouth every 8 (eight) hours as needed for up to 14 days for nausea or vomiting.   OVER THE COUNTER MEDICATION Take 30 mg by mouth 2 (two) times daily. Bio-Quercetin supplement   oxyCODONE 5 MG immediate release tablet Commonly known as: Roxicodone Take 1 tablet (5 mg total) by mouth every 4 (four) hours as needed for up to 7 days for severe pain or moderate pain.   oxymetazoline 0.05 % nasal spray Commonly known as: AFRIN Place 1 spray into both nostrils at bedtime as needed for congestion.   triamcinolone cream 0.1 % Commonly known as: KENALOG Apply topically once a day as needed (eczema).   Vitamin C 500 MG Caps Take 500 mg by mouth daily. liposoma   Vitamin D3 125 MCG (5000 UT) Caps Take 5,000 Units by mouth daily.   VITAMIN K2-VITAMIN D3 PO Take 1 tablet by mouth daily. Vitamin d 4000 units Vitamin K2 100 mg   ZINC 15 PO Take 15 mg by mouth daily.        Diagnostic Studies: DG Knee Left Port  Result Date: 05/27/2023 CLINICAL DATA:  Postop. EXAM: PORTABLE LEFT KNEE - 1-2 VIEW COMPARISON:  None Available. FINDINGS: Left knee arthroplasty in expected alignment. No periprosthetic lucency or fracture. There has been patellar resurfacing. Recent postsurgical change includes air and edema in the soft tissues and joint space. IMPRESSION: Left knee arthroplasty without immediate  postoperative complication. Electronically Signed   By: Narda Rutherford M.D.   On: 05/27/2023 10:07    Disposition: Discharge disposition: 01-Home or Self Care       Discharge Instructions     Call MD / Call 911   Complete by: As directed    If you experience chest pain or shortness of breath, CALL 911 and be transported to the hospital emergency room.  If you develope a fever above 101 F,  pus (white drainage) or increased drainage or redness at the wound, or calf pain, call your surgeon's office.   Constipation Prevention   Complete by: As directed    Drink plenty of fluids.  Prune juice may be helpful.  You may use a stool softener, such as Colace (over the counter) 100 mg twice a day.  Use MiraLax (over the counter) for constipation as needed.   Diet - low sodium heart healthy   Complete by: As directed    Increase activity slowly as tolerated   Complete by: As directed    Post-operative opioid taper instructions:   Complete by: As directed    POST-OPERATIVE OPIOID TAPER INSTRUCTIONS: It is important to wean off of your opioid medication as soon as possible. If you do not need pain medication after your surgery it is ok to stop day one. Opioids include: Codeine, Hydrocodone(Norco, Vicodin), Oxycodone(Percocet, oxycontin) and hydromorphone amongst others.  Long term and even short term use of opiods can cause: Increased pain response Dependence Constipation Depression Respiratory depression And more.  Withdrawal symptoms can include Flu like symptoms Nausea, vomiting And more Techniques to manage these symptoms Hydrate well Eat regular healthy meals Stay active Use relaxation techniques(deep breathing, meditating, yoga) Do Not substitute Alcohol to help with tapering If you have been on opioids for less than two weeks and do not have pain than it is ok to stop all together.  Plan to wean off of opioids This plan should start within one week post op of your joint  replacement. Maintain the same interval or time between taking each dose and first decrease the dose.  Cut the total daily intake of opioids by one tablet each day Next start to increase the time between doses. The last dose that should be eliminated is the evening dose.           Follow-up Information     Joen Laura, MD Follow up in 2 week(s).   Specialty: Orthopedic Surgery Contact information: [address] [place] [address] [phone]                    Discharge Instructions      INSTRUCTIONS AFTER JOINT REPLACEMENT   Remove items at home which could result in a fall. This includes throw rugs or furniture in walking pathways ICE to the affected joint every three hours while awake for 30 minutes at a time, for at least the first 3-5 days, and then as needed for pain and swelling.  Continue to use ice for pain and swelling. You may notice swelling that will progress down to the foot and ankle.  This is normal after surgery.  Elevate your leg when you are not up walking on it.   Continue to use the breathing machine you got in the hospital (incentive spirometer) which will help keep your temperature down.  It is common for your temperature to cycle up and down following surgery, especially at night when you are not up moving around and exerting yourself.  The breathing machine keeps your lungs expanded and your temperature down.   DIET:  As you were doing prior to hospitalization, we recommend a well-balanced diet.  DRESSING / WOUND CARE / SHOWERING  Keep the surgical dressing until follow up.  The dressing is water proof, so you can shower without any extra covering.  IF THE DRESSING FALLS OFF or the wound gets wet inside, change the dressing with sterile gauze.  Please use good hand washing techniques before changing the dressing.  Do not use any lotions or creams on the incision until instructed by your surgeon.    ACTIVITY  Increase  activity slowly as tolerated, but follow the weight bearing instructions below.   No driving for 6 weeks or until further direction given by your physician.  You cannot drive while taking narcotics.  No lifting or carrying greater than 10 lbs. until further directed by your surgeon. Avoid periods of inactivity such as sitting longer than an hour when not asleep. This helps prevent blood clots.  You may return to work once you are authorized by your doctor.     WEIGHT BEARING   Weight bearing as tolerated with assist device (walker, cane, etc) as directed, use it as long as suggested by your surgeon or therapist, typically at least 4-6 weeks.   EXERCISES  Results after joint replacement surgery are often greatly improved when you follow the exercise, range of motion and muscle strengthening exercises prescribed by your doctor. Safety measures are also important to protect the joint from further injury. Any time any of these exercises cause you to have increased pain or swelling, decrease what you are doing until you are comfortable again and then slowly increase them. If you have problems or questions, call your caregiver or physical therapist for advice.   Rehabilitation is important following a joint replacement. After just a few days of immobilization, the muscles of the leg can become weakened and shrink (atrophy).  These exercises are designed to build up the tone and strength of the thigh and leg muscles and to improve motion. Often times heat used for twenty to thirty minutes before working out will loosen up your tissues and help with improving the range of motion but do not use heat for the first two weeks following surgery (sometimes heat can increase post-operative swelling).   These exercises can be done on a training (exercise) mat, on the floor, on a table or on a bed. Use whatever works the best and is most comfortable for you.    Use music or television while you are exercising so  that the exercises are a pleasant break in your day. This will make your life better with the exercises acting as a break in your routine that you can look forward to.   Perform all exercises about fifteen times, three times per day or as directed.  You should exercise both the operative leg and the other leg as well.  Exercises include:   Quad Sets - Tighten up the muscle on the front of the thigh (Quad) and hold for 5-10 seconds.   Straight Leg Raises - With your knee straight (if you were given a brace, keep it on), lift the leg to 60 degrees, hold for 3 seconds, and slowly lower the leg.  Perform this exercise against resistance later as your leg gets stronger.  Leg Slides: Lying on your back, slowly slide your foot toward your buttocks, bending your knee up off the floor (only go as far as is comfortable). Then slowly slide your foot back down until your leg is flat on the floor again.  Angel Wings: Lying on your back spread your legs to the side as far apart as you can without causing discomfort.  Hamstring Strength:  Lying on your back, push your heel against the floor with your leg straight by tightening up the muscles of your buttocks.  Repeat, but this time bend  your knee to a comfortable angle, and push your heel against the floor.  You may put a pillow under the heel to make it more comfortable if necessary.   A rehabilitation program following joint replacement surgery can speed recovery and prevent re-injury in the future due to weakened muscles. Contact your doctor or a physical therapist for more information on knee rehabilitation.    CONSTIPATION  Constipation is defined medically as fewer than three stools per week and severe constipation as less than one stool per week.  Even if you have a regular bowel pattern at home, your normal regimen is likely to be disrupted due to multiple reasons following surgery.  Combination of anesthesia, postoperative narcotics, change in appetite and  fluid intake all can affect your bowels.   YOU MUST use at least one of the following options; they are listed in order of increasing strength to get the job done.  They are all available over the counter, and you may need to use some, POSSIBLY even all of these options:    Drink plenty of fluids (prune juice may be helpful) and high fiber foods Colace 100 mg by mouth twice a day  Senokot for constipation as directed and as needed Dulcolax (bisacodyl), take with full glass of water  Miralax (polyethylene glycol) once or twice a day as needed.  If you have tried all these things and are unable to have a bowel movement in the first 3-4 days after surgery call either your surgeon or your primary doctor.    If you experience loose stools or diarrhea, hold the medications until you stool forms back up.  If your symptoms do not get better within 1 week or if they get worse, check with your doctor.  If you experience "the worst abdominal pain ever" or develop nausea or vomiting, please contact the office immediately for further recommendations for treatment.   ITCHING:  If you experience itching with your medications, try taking only a single pain pill, or even half a pain pill at a time.  You can also use Benadryl over the counter for itching or also to help with sleep.   TED HOSE STOCKINGS:  Use stockings on both legs until for at least 2 weeks or as directed by physician office. They may be removed at night for sleeping.  MEDICATIONS:  See your medication summary on the "After Visit Summary" that nursing will review with you.  You may have some home medications which will be placed on hold until you complete the course of blood thinner medication.  It is important for you to complete the blood thinner medication as prescribed.   Blood clot prevention (DVT Prophylaxis): After surgery you are at an increased risk for a blood clot. You will take your Eliquis 2.5mg  twice daily for first 2 days after  surgery, then resume 5mg  on the third day. This will help prevent a blood clot. Signs of a pulmonary embolus (blood clot in the lungs) include sudden short of breath, feeling lightheaded or dizzy, chest pain with a deep breath, rapid pulse rapid breathing. Signs of a blood clot in your arms or legs include new unexplained swelling and cramping, warm, red or darkened skin around the painful area. Please call the office or 911 right away if these signs or symptoms develop.  PRECAUTIONS:  If you experience chest pain or shortness of breath - call 911 immediately for transfer to the hospital emergency department.   If you develop a fever  greater that 101 F, purulent drainage from wound, increased redness or drainage from wound, foul odor from the wound/dressing, or calf pain - CONTACT YOUR SURGEON.                                                   FOLLOW-UP APPOINTMENTS:  If you do not already have a post-op appointment, please call the office for an appointment to be seen by your surgeon.  Guidelines for how soon to be seen are listed in your "After Visit Summary", but are typically between 2-3 weeks after surgery.   POST-OPERATIVE OPIOID TAPER INSTRUCTIONS: It is important to wean off of your opioid medication as soon as possible. If you do not need pain medication after your surgery it is ok to stop day one. Opioids include: Codeine, Hydrocodone(Norco, Vicodin), Oxycodone(Percocet, oxycontin) and hydromorphone amongst others.  Long term and even short term use of opiods can cause: Increased pain response Dependence Constipation Depression Respiratory depression And more.  Withdrawal symptoms can include Flu like symptoms Nausea, vomiting And more Techniques to manage these symptoms Hydrate well Eat regular healthy meals Stay active Use relaxation techniques(deep breathing, meditating, yoga) Do Not substitute Alcohol to help with tapering If you have been on opioids for less than two  weeks and do not have pain than it is ok to stop all together.  Plan to wean off of opioids This plan should start within one week post op of your joint replacement. Maintain the same interval or time between taking each dose and first decrease the dose.  Cut the total daily intake of opioids by one tablet each day Next start to increase the time between doses. The last dose that should be eliminated is the evening dose.   MAKE SURE YOU:  Understand these instructions.  Get help right away if you are not doing well or get worse.    Thank you for letting us be a part of your medical care team.  It is a privilege we respect greatly.  We hope these instructions will help you stay on track for a fast and full recovery!            Signed: Ivy Puryear A Beaux Wedemeyer 05/28/2023, 6:55 AM

## 2023-05-28 NOTE — Progress Notes (Signed)
Physical Therapy Treatment Patient Details Name: Donna Ramsey MRN: [id number] DOB: 1951/02/10 Today's Date: 05/28/2023   History of Present Illness 72 yo female s/p L TKA on 05/27/23. PMH: peroneal tendonitis, fibula fx    PT Comments    Pt progressing well with gait, knee ROM looks fantastic today,pain controlled. Will see again to review staris and pt should be ready to d/c this afternoon   Recommendations for follow up therapy are one component of a multi-disciplinary discharge planning process, led by the attending physician.  Recommendations may be updated based on patient status, additional functional criteria and insurance authorization.  Follow Up Recommendations       Assistance Recommended at Discharge Intermittent Supervision/Assistance  Patient can return home with the following Assist for transportation;Help with stairs or ramp for entrance;Assistance with cooking/housework   Equipment Recommendations  Rolling walker (2 wheels)    Recommendations for Other Services       Precautions / Restrictions Precautions Precautions: Fall;Knee Precaution Booklet Issued: No Restrictions Weight Bearing Restrictions: No Other Position/Activity Restrictions: WBAT     Mobility  Bed Mobility Overal bed mobility: Needs Assistance Bed Mobility: Supine to Sit     Supine to sit: Modified independent (Device/Increase time)          Transfers   Equipment used: Rolling walker (2 wheels) Transfers: Sit to/from Stand Sit to Stand: Min guard           General transfer comment: cues for hand placement    Ambulation/Gait Ambulation/Gait assistance: Min guard, Supervision Gait Distance (Feet): 130 Feet Assistive device: Rolling walker (2 wheels) Gait Pattern/deviations: Step-to pattern, Step-through pattern       General Gait Details: verbal cues for sequence and proximity to RW, progression to step through   Praxair Mobility     Modified Rankin (Stroke Patients Only)       Balance   Sitting-balance support: Feet supported, No upper extremity supported Sitting balance-Leahy Scale: Good     Standing balance support: Reliant on assistive device for balance, During functional activity Standing balance-Leahy Scale: Fair Standing balance comment: able to static stand with close supervision                            Cognition Arousal/Alertness: Awake/alert Behavior During Therapy: WFL for tasks assessed/performed Overall Cognitive Status: Within Functional Limits for tasks assessed                                          Exercises Total Joint Exercises Ankle Circles/Pumps: AROM, 15 reps, Both Quad Sets: AROM, Both, 10 reps Heel Slides: AROM, AAROM, Left, 10 reps Hip ABduction/ADduction: Both, 10 reps Straight Leg Raises: AROM, Left, 10 reps Goniometric ROM: 5 to 122 degrees L knee flexion    General Comments        Pertinent Vitals/Pain Pain Assessment Pain Assessment: 0-10 Pain Score: 5  Pain Location: L knee Pain Descriptors / Indicators: Aching, Discomfort Pain Intervention(s): Limited activity within patient's tolerance, Monitored during session, Repositioned, Ice applied, RN gave pain meds during session    Home Living                          Prior Function  PT Goals (current goals can now be found in the care plan section) Acute Rehab PT Goals Patient Stated Goal: back to walking without pain PT Goal Formulation: With patient Time For Goal Achievement: 06/03/23 Potential to Achieve Goals: Good Progress towards PT goals: Progressing toward goals    Frequency    7X/week      PT Plan Current plan remains appropriate    Co-evaluation              AM-PAC PT "6 Clicks" Mobility   Outcome Measure  Help needed turning from your back to your side while in a flat bed without using bedrails?: A Little Help needed moving  from lying on your back to sitting on the side of a flat bed without using bedrails?: None Help needed moving to and from a bed to a chair (including a wheelchair)?: A Little Help needed standing up from a chair using your arms (e.g., wheelchair or bedside chair)?: A Little Help needed to walk in hospital room?: A Little Help needed climbing 3-5 steps with a railing? : A Little 6 Click Score: 19    End of Session Equipment Utilized During Treatment: Gait belt Activity Tolerance: Patient tolerated treatment well Patient left: with call bell/phone within reach;in chair;with chair alarm set Nurse Communication: Mobility status PT Visit Diagnosis: Other abnormalities of gait and mobility (R26.89);Difficulty in walking, not elsewhere classified (R26.2)     Time: 4098-1191 PT Time Calculation (min) (ACUTE ONLY): 30 min  Charges:  $Gait Training: 8-22 mins $Therapeutic Exercise: 8-22 mins                     Delice Bison, PT  Acute Rehab Dept [facility]) [phone]  05/28/2023    [facility] 05/28/2023, 10:11 AM

## 2023-05-29 DIAGNOSIS — M6281 Muscle weakness (generalized): Secondary | ICD-10-CM | POA: Diagnosis not present

## 2023-05-29 DIAGNOSIS — M1712 Unilateral primary osteoarthritis, left knee: Secondary | ICD-10-CM | POA: Diagnosis not present

## 2023-05-29 DIAGNOSIS — M25662 Stiffness of left knee, not elsewhere classified: Secondary | ICD-10-CM | POA: Diagnosis not present

## 2023-05-29 DIAGNOSIS — R262 Difficulty in walking, not elsewhere classified: Secondary | ICD-10-CM | POA: Diagnosis not present

## 2023-05-31 ENCOUNTER — Encounter: Payer: Self-pay | Admitting: Cardiology

## 2023-06-03 DIAGNOSIS — R262 Difficulty in walking, not elsewhere classified: Secondary | ICD-10-CM | POA: Diagnosis not present

## 2023-06-03 DIAGNOSIS — M25662 Stiffness of left knee, not elsewhere classified: Secondary | ICD-10-CM | POA: Diagnosis not present

## 2023-06-03 DIAGNOSIS — M1712 Unilateral primary osteoarthritis, left knee: Secondary | ICD-10-CM | POA: Diagnosis not present

## 2023-06-03 DIAGNOSIS — M6281 Muscle weakness (generalized): Secondary | ICD-10-CM | POA: Diagnosis not present

## 2023-06-05 DIAGNOSIS — M25662 Stiffness of left knee, not elsewhere classified: Secondary | ICD-10-CM | POA: Diagnosis not present

## 2023-06-05 DIAGNOSIS — M6281 Muscle weakness (generalized): Secondary | ICD-10-CM | POA: Diagnosis not present

## 2023-06-05 DIAGNOSIS — M1712 Unilateral primary osteoarthritis, left knee: Secondary | ICD-10-CM | POA: Diagnosis not present

## 2023-06-05 DIAGNOSIS — R262 Difficulty in walking, not elsewhere classified: Secondary | ICD-10-CM | POA: Diagnosis not present

## 2023-06-11 DIAGNOSIS — M6281 Muscle weakness (generalized): Secondary | ICD-10-CM | POA: Diagnosis not present

## 2023-06-11 DIAGNOSIS — R262 Difficulty in walking, not elsewhere classified: Secondary | ICD-10-CM | POA: Diagnosis not present

## 2023-06-11 DIAGNOSIS — M1712 Unilateral primary osteoarthritis, left knee: Secondary | ICD-10-CM | POA: Diagnosis not present

## 2023-06-11 DIAGNOSIS — M25662 Stiffness of left knee, not elsewhere classified: Secondary | ICD-10-CM | POA: Diagnosis not present

## 2023-06-13 DIAGNOSIS — M6281 Muscle weakness (generalized): Secondary | ICD-10-CM | POA: Diagnosis not present

## 2023-06-13 DIAGNOSIS — M25662 Stiffness of left knee, not elsewhere classified: Secondary | ICD-10-CM | POA: Diagnosis not present

## 2023-06-13 DIAGNOSIS — M1712 Unilateral primary osteoarthritis, left knee: Secondary | ICD-10-CM | POA: Diagnosis not present

## 2023-06-13 DIAGNOSIS — R262 Difficulty in walking, not elsewhere classified: Secondary | ICD-10-CM | POA: Diagnosis not present

## 2023-06-17 ENCOUNTER — Telehealth: Payer: Self-pay

## 2023-06-17 ENCOUNTER — Other Ambulatory Visit: Payer: Self-pay | Admitting: Surgery

## 2023-06-17 DIAGNOSIS — M6281 Muscle weakness (generalized): Secondary | ICD-10-CM | POA: Diagnosis not present

## 2023-06-17 DIAGNOSIS — M25662 Stiffness of left knee, not elsewhere classified: Secondary | ICD-10-CM | POA: Diagnosis not present

## 2023-06-17 DIAGNOSIS — R262 Difficulty in walking, not elsewhere classified: Secondary | ICD-10-CM | POA: Diagnosis not present

## 2023-06-17 DIAGNOSIS — M1712 Unilateral primary osteoarthritis, left knee: Secondary | ICD-10-CM | POA: Diagnosis not present

## 2023-06-17 DIAGNOSIS — Z1231 Encounter for screening mammogram for malignant neoplasm of breast: Secondary | ICD-10-CM

## 2023-06-17 NOTE — Telephone Encounter (Signed)
Notified patient of Titration results and recommendations. Patient stated she recently had Knee Surgery three Weeks ago. Patient will call back to schedule In-lab Titration as recommended.

## 2023-06-17 NOTE — Telephone Encounter (Signed)
-----   Message from Quintella Reichert, MD sent at 04/25/2023  8:02 AM EDT ----- Please let patient know that they have sleep apnea.  Recommend therapeutic CPAP titration full night study as we could not get her adequately titrated during the split night for treatment of patient's sleep disordered breathing.  If unable to perform an in lab titration then initiate ResMed auto CPAP from 4 to 15cm H2O with heated humidity and mask of choice and overnight pulse ox on CPAP.

## 2023-06-21 ENCOUNTER — Other Ambulatory Visit (HOSPITAL_COMMUNITY): Payer: Self-pay

## 2023-06-21 DIAGNOSIS — R262 Difficulty in walking, not elsewhere classified: Secondary | ICD-10-CM | POA: Diagnosis not present

## 2023-06-21 DIAGNOSIS — M6281 Muscle weakness (generalized): Secondary | ICD-10-CM | POA: Diagnosis not present

## 2023-06-21 DIAGNOSIS — M25662 Stiffness of left knee, not elsewhere classified: Secondary | ICD-10-CM | POA: Diagnosis not present

## 2023-06-21 DIAGNOSIS — M1712 Unilateral primary osteoarthritis, left knee: Secondary | ICD-10-CM | POA: Diagnosis not present

## 2023-06-21 MED ORDER — ALPRAZOLAM 0.25 MG PO TABS
0.2500 mg | ORAL_TABLET | Freq: Every evening | ORAL | 0 refills | Status: DC | PRN
  Filled 2023-06-21: qty 30, 30d supply, fill #0

## 2023-06-24 DIAGNOSIS — N6012 Diffuse cystic mastopathy of left breast: Secondary | ICD-10-CM | POA: Diagnosis not present

## 2023-06-24 DIAGNOSIS — M1712 Unilateral primary osteoarthritis, left knee: Secondary | ICD-10-CM | POA: Diagnosis not present

## 2023-06-24 DIAGNOSIS — N6011 Diffuse cystic mastopathy of right breast: Secondary | ICD-10-CM | POA: Diagnosis not present

## 2023-06-24 DIAGNOSIS — M25662 Stiffness of left knee, not elsewhere classified: Secondary | ICD-10-CM | POA: Diagnosis not present

## 2023-06-24 DIAGNOSIS — M6281 Muscle weakness (generalized): Secondary | ICD-10-CM | POA: Diagnosis not present

## 2023-06-24 DIAGNOSIS — R262 Difficulty in walking, not elsewhere classified: Secondary | ICD-10-CM | POA: Diagnosis not present

## 2023-06-26 DIAGNOSIS — M6281 Muscle weakness (generalized): Secondary | ICD-10-CM | POA: Diagnosis not present

## 2023-06-26 DIAGNOSIS — R262 Difficulty in walking, not elsewhere classified: Secondary | ICD-10-CM | POA: Diagnosis not present

## 2023-06-26 DIAGNOSIS — M1712 Unilateral primary osteoarthritis, left knee: Secondary | ICD-10-CM | POA: Diagnosis not present

## 2023-06-26 DIAGNOSIS — M25662 Stiffness of left knee, not elsewhere classified: Secondary | ICD-10-CM | POA: Diagnosis not present

## 2023-06-27 ENCOUNTER — Other Ambulatory Visit: Payer: Self-pay | Admitting: Surgery

## 2023-06-27 DIAGNOSIS — N6011 Diffuse cystic mastopathy of right breast: Secondary | ICD-10-CM

## 2023-07-02 ENCOUNTER — Other Ambulatory Visit: Payer: Self-pay

## 2023-07-02 ENCOUNTER — Other Ambulatory Visit (HOSPITAL_COMMUNITY): Payer: Self-pay

## 2023-07-02 ENCOUNTER — Encounter: Payer: Self-pay | Admitting: Cardiology

## 2023-07-02 MED ORDER — DILTIAZEM HCL ER COATED BEADS 120 MG PO CP24
120.0000 mg | ORAL_CAPSULE | Freq: Every day | ORAL | 0 refills | Status: DC
  Filled 2023-07-02: qty 90, 90d supply, fill #0

## 2023-07-03 ENCOUNTER — Encounter: Payer: Self-pay | Admitting: Surgery

## 2023-07-03 ENCOUNTER — Other Ambulatory Visit: Payer: Self-pay | Admitting: Surgery

## 2023-07-03 DIAGNOSIS — M6281 Muscle weakness (generalized): Secondary | ICD-10-CM | POA: Diagnosis not present

## 2023-07-03 DIAGNOSIS — N6011 Diffuse cystic mastopathy of right breast: Secondary | ICD-10-CM

## 2023-07-03 DIAGNOSIS — M1712 Unilateral primary osteoarthritis, left knee: Secondary | ICD-10-CM | POA: Diagnosis not present

## 2023-07-03 DIAGNOSIS — M25662 Stiffness of left knee, not elsewhere classified: Secondary | ICD-10-CM | POA: Diagnosis not present

## 2023-07-03 DIAGNOSIS — R262 Difficulty in walking, not elsewhere classified: Secondary | ICD-10-CM | POA: Diagnosis not present

## 2023-07-05 ENCOUNTER — Encounter: Payer: Self-pay | Admitting: Cardiology

## 2023-07-08 ENCOUNTER — Other Ambulatory Visit (HOSPITAL_COMMUNITY): Payer: Self-pay

## 2023-07-08 ENCOUNTER — Telehealth: Payer: Self-pay | Admitting: *Deleted

## 2023-07-08 NOTE — Telephone Encounter (Signed)
Received clearance for procedure below.  Will send back to the preop team to address.

## 2023-07-08 NOTE — Telephone Encounter (Signed)
   Name: Donna Ramsey  DOB: 04/04/1951  MRN: [id number]  Primary Cardiologist: None   Preoperative team, please contact this patient and set up a phone call appointment for further preoperative risk assessment. Please obtain consent and complete medication review. Thank you for your help.  I confirm that guidance regarding antiplatelet and oral anticoagulation therapy has been completed and, if necessary, noted below.  Per office protocol, patient can hold Eliquis for 2 days prior to procedure.   Patient will not need bridging with Lovenox (enoxaparin) around procedure. Please resume Eliquis as soon as possible postprocedure, at the discretion of the surgeon.   Joylene Grapes, NP 07/08/2023, 11:32 AM [facility]

## 2023-07-08 NOTE — Telephone Encounter (Signed)
Pt has been scheduled for tele pre op appt 07/17/23 @ 9:40. Med rec and consent are done.

## 2023-07-08 NOTE — Telephone Encounter (Signed)
Pt has been scheduled for tele pre op appt 07/17/23 @ 9:40. Med rec and consent are done.     Patient Consent for Virtual Visit        Donna Ramsey has provided verbal consent on 07/08/2023 for a virtual visit (video or telephone).   CONSENT FOR VIRTUAL VISIT FOR:  Donna Ramsey  By participating in this virtual visit I agree to the following:  I hereby voluntarily request, consent and authorize [facility] and its employed or contracted physicians, physician assistants, nurse practitioners or other licensed health care professionals (the Practitioner), to provide me with telemedicine health care services (the "Services") as deemed necessary by the treating Practitioner. I acknowledge and consent to receive the Services by the Practitioner via telemedicine. I understand that the telemedicine visit will involve communicating with the Practitioner through live audiovisual communication technology and the disclosure of certain medical information by electronic transmission. I acknowledge that I have been given the opportunity to request an in-person assessment or other available alternative prior to the telemedicine visit and am voluntarily participating in the telemedicine visit.  I understand that I have the right to withhold or withdraw my consent to the use of telemedicine in the course of my care at any time, without affecting my right to future care or treatment, and that the Practitioner or I may terminate the telemedicine visit at any time. I understand that I have the right to inspect all information obtained and/or recorded in the course of the telemedicine visit and may receive copies of available information for a reasonable fee.  I understand that some of the potential risks of receiving the Services via telemedicine include:  Delay or interruption in medical evaluation due to technological equipment failure or disruption; Information transmitted may not be sufficient (e.g.  poor resolution of images) to allow for appropriate medical decision making by the Practitioner; and/or  In rare instances, security protocols could fail, causing a breach of personal health information.  Furthermore, I acknowledge that it is my responsibility to provide information about my medical history, conditions and care that is complete and accurate to the best of my ability. I acknowledge that Practitioner's advice, recommendations, and/or decision may be based on factors not within their control, such as incomplete or inaccurate data provided by me or distortions of diagnostic images or specimens that may result from electronic transmissions. I understand that the practice of medicine is not an exact science and that Practitioner makes no warranties or guarantees regarding treatment outcomes. I acknowledge that a copy of this consent can be made available to me via my patient portal [facility] MyChart), or I can request a printed copy by calling the office of [facility].    I understand that my insurance will be billed for this visit.   I have read or had this consent read to me. I understand the contents of this consent, which adequately explains the benefits and risks of the Services being provided via telemedicine.  I have been provided ample opportunity to ask questions regarding this consent and the Services and have had my questions answered to my satisfaction. I give my informed consent for the services to be provided through the use of telemedicine in my medical care

## 2023-07-09 DIAGNOSIS — M1712 Unilateral primary osteoarthritis, left knee: Secondary | ICD-10-CM | POA: Diagnosis not present

## 2023-07-17 ENCOUNTER — Ambulatory Visit: Payer: Medicare Other | Attending: Internal Medicine

## 2023-07-17 DIAGNOSIS — Z01810 Encounter for preprocedural cardiovascular examination: Secondary | ICD-10-CM | POA: Diagnosis not present

## 2023-07-17 NOTE — Progress Notes (Signed)
Virtual Visit via Telephone Note   Because of Donna Ramsey's co-morbid illnesses, she is at least at moderate risk for complications without adequate follow up.  This format is felt to be most appropriate for this patient at this time.  The patient did not have access to video technology/had technical difficulties with video requiring transitioning to audio format only (telephone).  All issues noted in this document were discussed and addressed.  No physical exam could be performed with this format.  Please refer to the patient's chart for her consent to telehealth for [facility].  Evaluation Performed:  Preoperative cardiovascular risk assessment _____________   Date:  07/17/2023   Patient ID:  Donna Ramsey, DOB 08/29/1951, MRN [id number] Patient Location:  Home Provider location:   Office  Primary Care Provider:  Everrett Coombe, DO Primary Cardiologist:  None  Chief Complaint / Patient Profile   72 y.o. y/o female with a h/o paroxysmal AF, GERD, abnormal LFTs, portal HTN who is pending colonoscopy and presents today for telephonic preoperative cardiovascular risk assessment.  History of Present Illness    Donna Ramsey is a 72 y.o. female who presents via audio/video conferencing for a telehealth visit today.  Pt was last seen in cardiology clinic on 04/15/2023 by Dr. Lalla Brothers.  At that time Donna Ramsey was doing well with no new cardiac complaints.The patient is now pending procedure as outlined above. Since her last visit, she has been doing well with no new cardiac complaints.  She is not experienced any atrial fibrillation episodes since February and recently underwent total knee arthroplasty and is doing well.  She denies chest pain, shortness of breath, lower extremity edema, fatigue, palpitations, melena, hematuria, hemoptysis, diaphoresis, weakness, presyncope, syncope, orthopnea, and PND.    Past Medical History    Past Medical History:  Diagnosis  Date   A-fib (HCC)    Arthritis    Asplenia    Asthma    as a child   Family history of breast cancer    Hypertension    Liver disease, unspecified    heterogenous echotexture   Pneumonia    PONV (postoperative nausea and vomiting)    Portal hypertension (HCC)    Sleep apnea    mild  no cpap   Splenic infarct    Past Surgical History:  Procedure Laterality Date   ABDOMINAL HYSTERECTOMY     BREAST BIOPSY Bilateral 50 yrs ago   benign   CHONDROPLASTY Left 07/28/2020   Procedure: CHONDROPLASTY LEFT KNEE;  Surgeon: Bjorn Pippin, MD;  Location: [facility];  Service: Orthopedics;  Laterality: Left;   HERNIA REPAIR     INGUINAL HERNIA REPAIR Right 05/10/2021   Procedure: RIGHT INGUINAL HERNIA REPAIR WITH MESH;  Surgeon: Abigail Miyamoto, MD;  Location: [facility];  Service: General;  Laterality: Right;   KNEE ARTHROSCOPY WITH LATERAL MENISECTOMY Left 07/28/2020   Procedure: LEFT KNEE ARTHROSCOPY WITH LATERAL MENISECTOMY;  Surgeon: Bjorn Pippin, MD;  Location: [facility];  Service: Orthopedics;  Laterality: Left;   KNEE ARTHROSCOPY WITH MEDIAL MENISECTOMY Left 07/28/2020   Procedure: LEFT KNEE ARTHROSCOPY WITH MEDIAL MENISECTOMY;  Surgeon: Bjorn Pippin, MD;  Location:  SURGERY CENTER;  Service: Orthopedics;  Laterality: Left;   SPLENECTOMY, TOTAL     TOTAL KNEE ARTHROPLASTY Left 05/27/2023   Procedure: TOTAL KNEE ARTHROPLASTY;  Surgeon: Joen Laura, MD;  Location: [facility];  Service: Orthopedics;  Laterality: Left;    Allergies  Allergies  Allergen Reactions   Clindamycin Hives   Codeine Rash   Pentazocine Hives   Sulfamethoxazole-Trimethoprim Itching and Rash     2017 Reaction: rash, itching 03/16/2021: faint rash and itching 30 min after bactrim 400/80 oral challenge.    Doxycycline Other (See Comments)    Itching without rash near end of course    Levofloxacin Rash and Other (See Comments)    tendonitis    Neomycin Itching    Sulfa Antibiotics Rash    Home Medications    Prior to Admission medications   Medication Sig Start Date End Date Taking? Authorizing Provider  Acetylcysteine (NAC) 500 MG CAPS Take 500 mg by mouth 2 (two) times daily.    [provider]  ALPRAZolam Prudy Feeler) 0.25 MG tablet Take 1 tablet (0.25 mg total) by mouth at bedtime as needed for anxiety. 02/18/23   Everrett Coombe, DO  ALPRAZolam Prudy Feeler) 0.25 MG tablet Take 1 tablet (0.25 mg total) by mouth at bedtime as needed. 02/18/23     apixaban (ELIQUIS) 5 MG TABS tablet Take 1 tablet (5 mg total) by mouth 2 (two) times daily. Take 2.5mg  twice daily on 6/11 and 6/12. Resume 5mg  twice daily on 6/13. 05/28/23   Joen Laura, MD  Ascorbic Acid (VITAMIN C) 500 MG CAPS Take 500 mg by mouth daily. liposoma    [provider]  b complex vitamins capsule Take 1 capsule by mouth daily.    [provider]  Cholecalciferol (VITAMIN D3) 125 MCG (5000 UT) CAPS Take 5,000 Units by mouth daily.    [provider]  diltiazem (CARDIZEM CD) 120 MG 24 hr capsule Take 1 capsule (120 mg total) by mouth daily. 07/02/23   Lanier Prude, MD  diltiazem (CARDIZEM) 30 MG tablet Take 1 tablet every 4 hours AS NEEDED for AFIB heart rate >100 as long as top BP >100. 02/06/23   Newman Nip, NP  GLYCINE PO Take 3,000 mg by mouth at bedtime.    [provider]  lidocaine 4 % Place 1 patch onto the skin daily as needed (pain).    [provider]  MAGNESIUM PO Take 280 mg by mouth at bedtime.    [provider]  meloxicam (MOBIC) 7.5 MG tablet Take 1 tablet (7.5 mg total) by mouth daily. 05/27/23 05/26/24  Cecil Cobbs, PA-C  Nutritional Supplements (CHLORELLA-SPIRULINA COMPLEX PO) Take 6 tablets by mouth daily.    [provider]  Omega-3 Fatty Acids (FISH OIL PO) Take 640 mg by mouth 2 (two) times daily.    [provider]  OVER THE COUNTER MEDICATION Take 30 mg by mouth 2 (two) times  daily. Bio-Quercetin supplement    [provider]  oxymetazoline (AFRIN) 0.05 % nasal spray Place 1 spray into both nostrils at bedtime as needed for congestion.    [provider]  triamcinolone cream (KENALOG) 0.1 % Apply topically once a day as needed (eczema). 11/06/22   Everrett Coombe, DO  Vitamin D-Vitamin K (VITAMIN K2-VITAMIN D3 PO) Take 1 tablet by mouth daily. Vitamin d 4000 units Vitamin K2 100 mg    [provider]  Zinc Sulfate (ZINC 15 PO) Take 15 mg by mouth daily.    [provider]    Physical Exam    Vital Signs:  Simrin F Brouhard does not have vital signs available for review today.  Given telephonic nature of communication, physical exam is limited. AAOx3. NAD. Normal affect.  Speech and respirations  are unlabored.  Accessory Clinical Findings    None  Assessment & Plan    1.  Preoperative Cardiovascular Risk Assessment:  Patient's RCRI score is 0.4%  The patient affirms she has been doing well without any new cardiac symptoms. They are able to achieve 7 METS without cardiac limitations. Therefore, based on ACC/AHA guidelines, the patient would be at acceptable risk for the planned procedure without further cardiovascular testing. The patient was advised that if she develops new symptoms prior to surgery to contact our office to arrange for a follow-up visit, and she verbalized understanding.    The patient was advised that if she develops new symptoms prior to surgery to contact our office to arrange for a follow-up visit, and she verbalized understanding.  Patient can hold Eliquis 2 days prior to procedure and should restart postprocedure when surgically safe and advised by GI team.  A copy of this note will be routed to requesting surgeon.  Time:   Today, I have spent 7 minutes with the patient with telehealth technology discussing medical history, symptoms, and management plan.     Napoleon Form, Leodis Rains,  NP  07/17/2023, 7:01 AM

## 2023-08-07 ENCOUNTER — Ambulatory Visit: Payer: Medicare Other

## 2023-08-07 ENCOUNTER — Ambulatory Visit: Admission: RE | Admit: 2023-08-07 | Payer: Medicare Other | Source: Ambulatory Visit

## 2023-08-07 ENCOUNTER — Ambulatory Visit
Admission: RE | Admit: 2023-08-07 | Discharge: 2023-08-07 | Disposition: A | Discharge status: Home / Self Care | Payer: Medicare Other | Source: Ambulatory Visit | Attending: Surgery | Admitting: Surgery

## 2023-08-07 DIAGNOSIS — N6011 Diffuse cystic mastopathy of right breast: Secondary | ICD-10-CM

## 2023-08-07 DIAGNOSIS — N644 Mastodynia: Secondary | ICD-10-CM | POA: Diagnosis not present

## 2023-08-08 ENCOUNTER — Other Ambulatory Visit (HOSPITAL_COMMUNITY): Payer: Self-pay

## 2023-08-08 DIAGNOSIS — M1712 Unilateral primary osteoarthritis, left knee: Secondary | ICD-10-CM | POA: Diagnosis not present

## 2023-08-08 MED ORDER — AMOXICILLIN 500 MG PO CAPS
ORAL_CAPSULE | ORAL | 0 refills | Status: DC
  Filled 2023-08-08: qty 4, 1d supply, fill #0

## 2023-08-12 ENCOUNTER — Other Ambulatory Visit (HOSPITAL_COMMUNITY): Payer: Self-pay

## 2023-08-12 DIAGNOSIS — H35342 Macular cyst, hole, or pseudohole, left eye: Secondary | ICD-10-CM | POA: Diagnosis not present

## 2023-08-12 DIAGNOSIS — H35373 Puckering of macula, bilateral: Secondary | ICD-10-CM | POA: Diagnosis not present

## 2023-08-12 DIAGNOSIS — H43813 Vitreous degeneration, bilateral: Secondary | ICD-10-CM | POA: Diagnosis not present

## 2023-08-12 DIAGNOSIS — H2513 Age-related nuclear cataract, bilateral: Secondary | ICD-10-CM | POA: Diagnosis not present

## 2023-09-02 ENCOUNTER — Ambulatory Visit: Payer: Medicare Other | Attending: Student | Admitting: Student

## 2023-09-02 ENCOUNTER — Encounter: Payer: Self-pay | Admitting: Student

## 2023-09-02 VITALS — BP 120/62 | HR 65 | Ht 69.0 in | Wt 149.8 lb

## 2023-09-02 DIAGNOSIS — I480 Paroxysmal atrial fibrillation: Secondary | ICD-10-CM | POA: Diagnosis not present

## 2023-09-02 DIAGNOSIS — D6869 Other thrombophilia: Secondary | ICD-10-CM

## 2023-09-02 NOTE — Progress Notes (Signed)
Electrophysiology Office Note:   Date:  09/02/2023  ID:  Donna Ramsey, DOB [birth year], MRN [id number]  Primary Cardiologist: None Electrophysiologist: Lanier Prude, MD      History of Present Illness:   Donna Ramsey is a 72 y.o. female with h/o abnormal LFTs, GERD, portal HTN, and AF seen today for routine electrophysiology followup.   Since last being seen in our clinic the patient reports doing very well. She has not noticed any more AF since her original episode in February.  she denies chest pain, palpitations, dyspnea, PND, orthopnea, nausea, vomiting, dizziness, syncope, edema, weight gain, or early satiety.   Review of systems complete and found to be negative unless listed in HPI.   EP Information / Studies Reviewed:    EKG is ordered today. Personal review as below.  EKG Interpretation Date/Time:  Monday September 02 2023 09:04:52 EDT Ventricular Rate:  65 PR Interval:  194 QRS Duration:  90 QT Interval:  432 QTC Calculation: 449 R Axis:   75  Text Interpretation: Normal sinus rhythm Normal ECG Confirmed by Maxine Glenn [phone]) on 09/02/2023 9:09:35 AM     Physical Exam:   VS:  BP 120/62   Pulse 65   Ht 5\' 9"  (1.753 m)   Wt 149 lb 12.8 oz (67.9 kg)   SpO2 98%   BMI 22.12 kg/m    Wt Readings from Last 3 Encounters:  09/02/23 149 lb 12.8 oz (67.9 kg)  05/27/23 143 lb (64.9 kg)  05/14/23 143 lb (64.9 kg)     GEN: Well nourished, well developed in no acute distress NECK: No JVD; No carotid bruits CARDIAC: Regular rate and rhythm, no murmurs, rubs, gallops RESPIRATORY:  Clear to auscultation without rales, wheezing or rhonchi  ABDOMEN: Soft, non-tender, non-distended EXTREMITIES:  No edema; No deformity   ASSESSMENT AND PLAN:    Paroxysmal Atrial Fibrillation  EKG today shows NSR Continue Eliquis 5mg  BID for CHA2DS2VASC of at least 3 Continue Diltiazem 120 mg daily     Secondary hypercoagulable state Pt on Eliquis as above   ?  ASD Would be able to assess further on CT  L Knee surgery Went very well with good healing.   Follow up with Dr. Lalla Brothers in December as scheduled to discuss possibility of ablation > Watchman. Will discuss with him in the interim.   Signed, Graciella Freer, PA-C

## 2023-09-02 NOTE — Patient Instructions (Signed)
Medication Instructions:  Your physician recommends that you continue on your current medications as directed. Please refer to the Current Medication list given to you today.  *If you need a refill on your cardiac medications before your next appointment, please call your pharmacy*  Lab Work: None ordered If you have labs (blood work) drawn today and your tests are completely normal, you will receive your results only by: MyChart Message (if you have MyChart) OR A paper copy in the mail If you have any lab test that is abnormal or we need to change your treatment, we will call you to review the results.  Follow-Up: At [facility] you and your health needs are our priority.  As part of our continuing mission to provide you with exceptional heart care, we have created designated Provider Care Teams.  These Care Teams include your primary Cardiologist (physician) and Advanced Practice Providers (APPs -  Physician Assistants and Nurse Practitioners) who all work together to provide you with the care you need, when you need it.  Your next appointment:   12/17/23 at 10:30 AM  Provider:   Steffanie Dunn, MD

## 2023-09-25 ENCOUNTER — Other Ambulatory Visit (HOSPITAL_COMMUNITY): Payer: Self-pay

## 2023-09-25 MED ORDER — TRIAMCINOLONE ACETONIDE 0.1 % EX CREA
TOPICAL_CREAM | CUTANEOUS | 1 refills | Status: DC
  Filled 2023-09-25: qty 454, 30d supply, fill #0

## 2023-10-07 ENCOUNTER — Other Ambulatory Visit (HOSPITAL_COMMUNITY): Payer: Self-pay

## 2023-10-08 DIAGNOSIS — K2950 Unspecified chronic gastritis without bleeding: Secondary | ICD-10-CM | POA: Diagnosis not present

## 2023-10-08 DIAGNOSIS — Z7901 Long term (current) use of anticoagulants: Secondary | ICD-10-CM | POA: Diagnosis not present

## 2023-10-08 DIAGNOSIS — Z860100 Personal history of colon polyps, unspecified: Secondary | ICD-10-CM | POA: Diagnosis not present

## 2023-10-08 DIAGNOSIS — R194 Change in bowel habit: Secondary | ICD-10-CM | POA: Diagnosis not present

## 2023-10-08 DIAGNOSIS — Z8719 Personal history of other diseases of the digestive system: Secondary | ICD-10-CM | POA: Diagnosis not present

## 2023-10-08 DIAGNOSIS — J4520 Mild intermittent asthma, uncomplicated: Secondary | ICD-10-CM | POA: Diagnosis not present

## 2023-10-08 DIAGNOSIS — R198 Other specified symptoms and signs involving the digestive system and abdomen: Secondary | ICD-10-CM | POA: Diagnosis not present

## 2023-10-08 DIAGNOSIS — K648 Other hemorrhoids: Secondary | ICD-10-CM | POA: Diagnosis not present

## 2023-10-08 DIAGNOSIS — K3189 Other diseases of stomach and duodenum: Secondary | ICD-10-CM | POA: Diagnosis not present

## 2023-10-08 DIAGNOSIS — Z9081 Acquired absence of spleen: Secondary | ICD-10-CM | POA: Diagnosis not present

## 2023-10-08 DIAGNOSIS — K219 Gastro-esophageal reflux disease without esophagitis: Secondary | ICD-10-CM | POA: Diagnosis not present

## 2023-10-08 DIAGNOSIS — K766 Portal hypertension: Secondary | ICD-10-CM | POA: Diagnosis not present

## 2023-10-11 ENCOUNTER — Encounter: Payer: Self-pay | Admitting: Family Medicine

## 2023-10-11 ENCOUNTER — Ambulatory Visit (INDEPENDENT_AMBULATORY_CARE_PROVIDER_SITE_OTHER): Payer: Medicare Other | Admitting: Family Medicine

## 2023-10-11 VITALS — BP 106/68 | HR 60 | Temp 97.5°F | Ht 69.0 in | Wt 146.1 lb

## 2023-10-11 DIAGNOSIS — E7800 Pure hypercholesterolemia, unspecified: Secondary | ICD-10-CM

## 2023-10-11 DIAGNOSIS — I480 Paroxysmal atrial fibrillation: Secondary | ICD-10-CM

## 2023-10-11 DIAGNOSIS — Z0000 Encounter for general adult medical examination without abnormal findings: Secondary | ICD-10-CM | POA: Insufficient documentation

## 2023-10-11 NOTE — Patient Instructions (Signed)
Preventive Care 65 Years and Older, Female Preventive care refers to lifestyle choices and visits with your health care provider that can promote health and wellness. Preventive care visits are also called wellness exams. What can I expect for my preventive care visit? Counseling Your health care provider may ask you questions about your: Medical history, including: Past medical problems. Family medical history. Pregnancy and menstrual history. History of falls. Current health, including: Memory and ability to understand (cognition). Emotional well-being. Home life and relationship well-being. Sexual activity and sexual health. Lifestyle, including: Alcohol, nicotine or tobacco, and drug use. Access to firearms. Diet, exercise, and sleep habits. Work and work environment. Sunscreen use. Safety issues such as seatbelt and bike helmet use. Physical exam Your health care provider will check your: Height and weight. These may be used to calculate your BMI (body mass index). BMI is a measurement that tells if you are at a healthy weight. Waist circumference. This measures the distance around your waistline. This measurement also tells if you are at a healthy weight and may help predict your risk of certain diseases, such as type 2 diabetes and high blood pressure. Heart rate and blood pressure. Body temperature. Skin for abnormal spots. What immunizations do I need?  Vaccines are usually given at various ages, according to a schedule. Your health care provider will recommend vaccines for you based on your age, medical history, and lifestyle or other factors, such as travel or where you work. What tests do I need? Screening Your health care provider may recommend screening tests for certain conditions. This may include: Lipid and cholesterol levels. Hepatitis C test. Hepatitis B test. HIV (human immunodeficiency virus) test. STI (sexually transmitted infection) testing, if you are at  risk. Lung cancer screening. Colorectal cancer screening. Diabetes screening. This is done by checking your blood sugar (glucose) after you have not eaten for a while (fasting). Mammogram. Talk with your health care provider about how often you should have regular mammograms. BRCA-related cancer screening. This may be done if you have a family history of breast, ovarian, tubal, or peritoneal cancers. Bone density scan. This is done to screen for osteoporosis. Talk with your health care provider about your test results, treatment options, and if necessary, the need for more tests. Follow these instructions at home: Eating and drinking  Eat a diet that includes fresh fruits and vegetables, whole grains, lean protein, and low-fat dairy products. Limit your intake of foods with high amounts of sugar, saturated fats, and salt. Take vitamin and mineral supplements as recommended by your health care provider. Do not drink alcohol if your health care provider tells you not to drink. If you drink alcohol: Limit how much you have to 0-1 drink a day. Know how much alcohol is in your drink. In the [place] one drink equals one 12 oz bottle of beer (355 mL), one 5 oz glass of wine (148 mL), or one 1 oz glass of hard liquor (44 mL). Lifestyle Brush your teeth every morning and night with fluoride toothpaste. Floss one time each day. Exercise for at least 30 minutes 5 or more days each week. Do not use any products that contain nicotine or tobacco. These products include cigarettes, chewing tobacco, and vaping devices, such as e-cigarettes. If you need help quitting, ask your health care provider. Do not use drugs. If you are sexually active, practice safe sex. Use a condom or other form of protection in order to prevent STIs. Take aspirin only as told by   your health care provider. Make sure that you understand how much to take and what form to take. Work with your health care provider to find out whether it  is safe and beneficial for you to take aspirin daily. Ask your health care provider if you need to take a cholesterol-lowering medicine (statin). Find healthy ways to manage stress, such as: Meditation, yoga, or listening to music. Journaling. Talking to a trusted person. Spending time with friends and family. Minimize exposure to UV radiation to reduce your risk of skin cancer. Safety Always wear your seat belt while driving or riding in a vehicle. Do not drive: If you have been drinking alcohol. Do not ride with someone who has been drinking. When you are tired or distracted. While texting. If you have been using any mind-altering substances or drugs. Wear a helmet and other protective equipment during sports activities. If you have firearms in your house, make sure you follow all gun safety procedures. What's next? Visit your health care provider once a year for an annual wellness visit. Ask your health care provider how often you should have your eyes and teeth checked. Stay up to date on all vaccines. This information is not intended to replace advice given to you by your health care provider. Make sure you discuss any questions you have with your health care provider. Document Revised: 05/31/2021 Document Reviewed: 05/31/2021 Elsevier Patient Education  2024 [employer]  

## 2023-10-11 NOTE — Progress Notes (Signed)
Donna Ramsey - 72 y.o. female MRN [id number]  Date of birth: January 04, 1951  Subjective Chief Complaint  Patient presents with   Annual Exam    HPI Donna Ramsey is a 72 y.o. female here today for annual exam.   Reports that she is doing well.  No new concerns today.  History of A. Fib, remains controlled.   She continues to stay pretty active.  She follows a healthy diet.   Non-smoker.  Occasional EtOH  Review of Systems  Constitutional:  Negative for chills, fever, malaise/fatigue and weight loss.  HENT:  Negative for congestion, ear pain and sore throat.   Eyes:  Negative for blurred vision, double vision and pain.  Respiratory:  Negative for cough and shortness of breath.   Cardiovascular:  Negative for chest pain and palpitations.  Gastrointestinal:  Negative for abdominal pain, blood in stool, constipation, heartburn and nausea.  Genitourinary:  Negative for dysuria and urgency.  Musculoskeletal:  Negative for joint pain and myalgias.  Neurological:  Negative for dizziness and headaches.  Endo/Heme/Allergies:  Does not bruise/bleed easily.  Psychiatric/Behavioral:  Negative for depression. The patient is not nervous/anxious and does not have insomnia.     Allergies  Allergen Reactions   Clindamycin Hives   Codeine Rash   Pentazocine Hives   Sulfamethoxazole-Trimethoprim Itching and Rash     2017 Reaction: rash, itching 03/16/2021: faint rash and itching 30 min after bactrim 400/80 oral challenge.    Doxycycline Other (See Comments)    Itching without rash near end of course    Levofloxacin Rash and Other (See Comments)    tendonitis    Neomycin Itching   Sulfa Antibiotics Rash    Past Medical History:  Diagnosis Date   A-fib (HCC)    Arthritis    Asplenia    Asthma    as a child   Family history of breast cancer    Hypertension    Liver disease, unspecified    heterogenous echotexture   Pneumonia    PONV (postoperative nausea and vomiting)     Portal hypertension (HCC)    Sleep apnea    mild  no cpap   Splenic infarct     Past Surgical History:  Procedure Laterality Date   ABDOMINAL HYSTERECTOMY     BREAST BIOPSY Bilateral 50 yrs ago   benign   CHONDROPLASTY Left 07/28/2020   Procedure: CHONDROPLASTY LEFT KNEE;  Surgeon: Bjorn Pippin, MD;  Location: [facility];  Service: Orthopedics;  Laterality: Left;   HERNIA REPAIR     INGUINAL HERNIA REPAIR Right 05/10/2021   Procedure: RIGHT INGUINAL HERNIA REPAIR WITH MESH;  Surgeon: Abigail Miyamoto, MD;  Location: [facility];  Service: General;  Laterality: Right;   KNEE ARTHROSCOPY WITH LATERAL MENISECTOMY Left 07/28/2020   Procedure: LEFT KNEE ARTHROSCOPY WITH LATERAL MENISECTOMY;  Surgeon: Bjorn Pippin, MD;  Location: [facility];  Service: Orthopedics;  Laterality: Left;   KNEE ARTHROSCOPY WITH MEDIAL MENISECTOMY Left 07/28/2020   Procedure: LEFT KNEE ARTHROSCOPY WITH MEDIAL MENISECTOMY;  Surgeon: Bjorn Pippin, MD;  Location:  SURGERY CENTER;  Service: Orthopedics;  Laterality: Left;   SPLENECTOMY, TOTAL     TOTAL KNEE ARTHROPLASTY Left 05/27/2023   Procedure: TOTAL KNEE ARTHROPLASTY;  Surgeon: Joen Laura, MD;  Location: [facility];  Service: Orthopedics;  Laterality: Left;    Social History   Socioeconomic History   Marital status: Widowed    Spouse name: Not on file  Number of children: 3   Years of education: 16   Highest education level: Bachelor's degree (e.g., BA, AB, BS)  Occupational History    Comment: Retired Teacher, early years/pre  Tobacco Use   Smoking status: Never   Smokeless tobacco: Never  Vaping Use   Vaping status: Never Used  Substance and Sexual Activity   Alcohol use: Yes    Alcohol/week: 7.0 standard drinks of alcohol    Types: 7 Glasses of wine per week    Comment: occ wine   Drug use: Never   Sexual activity: Never  Other Topics Concern   Not on file  Social History Narrative   Lives alone but next to her  daughter. She is walking 3-5 miles per day. She likes to read, cook and do yard work.   Social Determinants of Health   Financial Resource Strain: Low Risk  (10/19/2022)   Overall Financial Resource Strain (CARDIA)    Difficulty of Paying Living Expenses: Not hard at all  Food Insecurity: No Food Insecurity (05/27/2023)   Hunger Vital Sign    Worried About Running Out of Food in the Last Year: Never true    Ran Out of Food in the Last Year: Never true  Transportation Needs: No Transportation Needs (05/27/2023)   PRAPARE - Administrator, Civil Service (Medical): No    Lack of Transportation (Non-Medical): No  Physical Activity: Sufficiently Active (10/19/2022)   Exercise Vital Sign    Days of Exercise per Week: 5 days    Minutes of Exercise per Session: 90 min  Stress: No Stress Concern Present (10/19/2022)   [employer] of Occupational Health - Occupational Stress Questionnaire    Feeling of Stress : Only a little  Social Connections: Moderately Integrated (10/19/2022)   Social Connection and Isolation Panel [NHANES]    Frequency of Communication with Friends and Family: More than three times a week    Frequency of Social Gatherings with Friends and Family: More than three times a week    Attends Religious Services: More than 4 times per year    Active Member of [employer] or Organizations: Yes    Attends Banker Meetings: More than 4 times per year    Marital Status: Widowed    Family History  Problem Relation Age of Onset   Breast cancer Mother 63   Stroke Mother    Hypertension Father    Heart attack Father    Diabetes Sister    Breast cancer Paternal Aunt    Breast cancer Paternal Grandmother        dx 61s   Rheum arthritis Daughter     Health Maintenance  Topic Date Due   Meningococcal B Vaccine (1 of 4 - Increased Risk) Never done   DTaP/Tdap/Td (4 - Td or Tdap) 03/29/2021   Medicare Annual Wellness (AWV)  11/15/2023   Pneumonia Vaccine 36+  Years old (3 of 3 - PPSV23 or PCV20) 10/20/2023 (Originally 01/15/2019)   COVID-19 Vaccine (4 - 2023-24 season) 10/27/2023 (Originally 08/18/2023)   INFLUENZA VACCINE  03/16/2024 (Originally 07/18/2023)   MAMMOGRAM  08/06/2025   Colonoscopy  10/07/2028   DEXA SCAN  Completed   Hepatitis C Screening  Completed   Zoster Vaccines- Shingrix  Completed   HPV VACCINES  Aged Out     ----------------------------------------------------------------------------------------------------------------------------------------------------------------------------------------------------------------- Physical Exam BP 106/68 (BP Location: Left Arm, Patient Position: Sitting, Cuff Size: Normal)   Pulse 60   Temp (!) 97.5 F (36.4 C) (Oral)   Ht  5\' 9"  (1.753 m)   Wt 146 lb 1.9 oz (66.3 kg)   SpO2 100%   BMI 21.58 kg/m   Physical Exam Constitutional:      General: She is not in acute distress. HENT:     Head: Normocephalic and atraumatic.     Right Ear: Tympanic membrane and ear canal normal.     Left Ear: Tympanic membrane and ear canal normal.     Nose: Nose normal.  Eyes:     General: No scleral icterus.    Conjunctiva/sclera: Conjunctivae normal.  Neck:     Thyroid: No thyromegaly.  Cardiovascular:     Rate and Rhythm: Normal rate and regular rhythm.     Heart sounds: Normal heart sounds.  Pulmonary:     Effort: Pulmonary effort is normal.     Breath sounds: Normal breath sounds.  Abdominal:     General: Bowel sounds are normal. There is no distension.     Palpations: Abdomen is soft.     Tenderness: There is no abdominal tenderness. There is no guarding.  Musculoskeletal:        General: Normal range of motion.     Cervical back: Normal range of motion and neck supple.  Lymphadenopathy:     Cervical: No cervical adenopathy.  Skin:    General: Skin is warm and dry.     Findings: No rash.  Neurological:     General: No focal deficit present.     Mental Status: She is alert and  oriented to person, place, and time.     Cranial Nerves: No cranial nerve deficit.     Coordination: Coordination normal.  Psychiatric:        Mood and Affect: Mood normal.        Behavior: Behavior normal.     ------------------------------------------------------------------------------------------------------------------------------------------------------------------------------------------------------------------- Assessment and Plan  Well adult exam Well adult Orders Placed This Encounter  Procedures   Lipid Panel With LDL/HDL Ratio   CBC with Differential/Platelet   CMP14+EGFR   TSH   NMR, lipoprofile  Screenings: per lab orders Immunizations:  She will get flu shot at pharmacy.  Anticipatory guidance/Risk factor reduction:  Recommendations per AVS.    No orders of the defined types were placed in this encounter.   No follow-ups on file.    This visit occurred during the SARS-CoV-2 public health emergency.  Safety protocols were in place, including screening questions prior to the visit, additional usage of staff PPE, and extensive cleaning of exam room while observing appropriate contact time as indicated for disinfecting solutions.

## 2023-10-11 NOTE — Assessment & Plan Note (Signed)
Well adult Orders Placed This Encounter  Procedures   Lipid Panel With LDL/HDL Ratio   CBC with Differential/Platelet   CMP14+EGFR   TSH   NMR, lipoprofile  Screenings: per lab orders Immunizations:  She will get flu shot at pharmacy.  Anticipatory guidance/Risk factor reduction:  Recommendations per AVS.

## 2023-10-13 LAB — TSH: TSH: 1.79 u[IU]/mL (ref 0.450–4.500)

## 2023-10-13 LAB — CBC WITH DIFFERENTIAL/PLATELET
Basophils Absolute: 0.1 10*3/uL (ref 0.0–0.2)
Basos: 2 %
EOS (ABSOLUTE): 0.1 10*3/uL (ref 0.0–0.4)
Eos: 2 %
Hematocrit: 40.1 % (ref 34.0–46.6)
Hemoglobin: 13.6 g/dL (ref 11.1–15.9)
Immature Grans (Abs): 0 10*3/uL (ref 0.0–0.1)
Immature Granulocytes: 0 %
Lymphocytes Absolute: 1.2 10*3/uL (ref 0.7–3.1)
Lymphs: 30 %
MCH: 33.7 pg — ABNORMAL HIGH (ref 26.6–33.0)
MCHC: 33.9 g/dL (ref 31.5–35.7)
MCV: 99 fL — ABNORMAL HIGH (ref 79–97)
Monocytes Absolute: 0.5 10*3/uL (ref 0.1–0.9)
Monocytes: 13 %
Neutrophils Absolute: 2.1 10*3/uL (ref 1.4–7.0)
Neutrophils: 53 %
Platelets: 332 10*3/uL (ref 150–450)
RBC: 4.04 x10E6/uL (ref 3.77–5.28)
RDW: 13.8 % (ref 11.7–15.4)
WBC: 4.1 10*3/uL (ref 3.4–10.8)

## 2023-10-13 LAB — CMP14+EGFR
ALT: 17 IU/L (ref 0–32)
AST: 35 [IU]/L (ref 0–40)
Albumin: 4.3 g/dL (ref 3.9–4.9)
Alkaline Phosphatase: 70 [IU]/L (ref 44–121)
BUN/Creatinine Ratio: 24 (ref 12–28)
BUN: 14 mg/dL (ref 8–27)
Bilirubin Total: 0.6 mg/dL (ref 0.0–1.2)
CO2: 18 mmol/L — ABNORMAL LOW (ref 20–29)
Calcium: 9.5 mg/dL (ref 8.7–10.3)
Chloride: 100 mmol/L (ref 96–106)
Creatinine, Ser: 0.58 mg/dL (ref 0.57–1.00)
Globulin, Total: 3.1 g/dL (ref 1.5–4.5)
Glucose: 84 mg/dL (ref 70–99)
Potassium: 4.9 mmol/L (ref 3.5–5.2)
Sodium: 137 mmol/L (ref 134–144)
Total Protein: 7.4 g/dL (ref 6.0–8.5)
eGFR: 97 mL/min/{1.73_m2} (ref >59)

## 2023-10-13 LAB — NMR, LIPOPROFILE
Cholesterol, Total: 260 mg/dL — ABNORMAL HIGH (ref 100–199)
HDL Particle Number: 38.1 umol/L (ref >=30.5)
HDL-C: 128 mg/dL (ref >39)
LDL Particle Number: 873 nmol/L (ref <1000)
LDL Size: 21.2 nm (ref >20.5)
LDL-C (NIH Calc): 125 mg/dL — ABNORMAL HIGH (ref 0–99)
LP-IR Score: <25 (ref <=45)
Small LDL Particle Number: <90 nmol/L (ref <=527)
Triglycerides: 46 mg/dL (ref 0–149)

## 2023-10-28 ENCOUNTER — Ambulatory Visit: Payer: Medicare Other | Admitting: Cardiology

## 2023-11-18 DIAGNOSIS — K080 Exfoliation of teeth due to systemic causes: Secondary | ICD-10-CM | POA: Diagnosis not present

## 2023-11-18 DIAGNOSIS — K766 Portal hypertension: Secondary | ICD-10-CM | POA: Diagnosis not present

## 2023-11-19 ENCOUNTER — Other Ambulatory Visit (HOSPITAL_COMMUNITY): Payer: Self-pay

## 2023-11-19 MED ORDER — AMOXICILLIN 500 MG PO CAPS
ORAL_CAPSULE | ORAL | 99 refills | Status: DC
  Filled 2023-11-19: qty 4, 1d supply, fill #0
  Filled 2024-02-06: qty 4, 1d supply, fill #1
  Filled 2024-04-10: qty 4, 1d supply, fill #2

## 2023-11-20 ENCOUNTER — Encounter: Payer: Self-pay | Admitting: Cardiology

## 2023-11-20 ENCOUNTER — Ambulatory Visit: Payer: Medicare Other | Attending: Cardiology | Admitting: Cardiology

## 2023-11-20 VITALS — BP 98/66 | HR 71 | Ht 69.5 in | Wt 148.0 lb

## 2023-11-20 DIAGNOSIS — I480 Paroxysmal atrial fibrillation: Secondary | ICD-10-CM | POA: Diagnosis not present

## 2023-11-20 NOTE — Progress Notes (Signed)
  Electrophysiology Office Follow up Visit Note:    Date:  11/20/2023   ID:  Donna Ramsey, DOB [birth year], MRN [id number]  PCP:  Everrett Coombe, DO  [facility] Cardiologist:  None  [facility] Electrophysiologist:  Lanier Prude, MD    Interval History:     Donna Ramsey is a 72 y.o. female who presents for a follow up visit.  I last saw the patient March 06, 2023 for her atrial fibrillation.  She is on Eliquis for stroke prophylaxis.  She is a retired Teacher, early years/pre.  At the last appointment we discussed atrial fibrillation ablation and watchman.  She was planning to have a knee replacement and wanted to touch base after that was completed.  Today she presents to clinic to discuss her rhythm. She has not experienced another AF episode. She continues to take the Eliquis and is hesitant to stop given her concern re: stroke risk.    Discussed the use of AI scribe software for clinical note transcription with the patient, who gave verbal consent to proceed.     Past medical, surgical, social and family history were reviewed.  ROS:   Please see the history of present illness.    All other systems reviewed and are negative.  EKGs/Labs/Other Studies Reviewed:    The following studies were reviewed today:          Physical Exam:    VS:  BP 98/66   Pulse 71   Ht 5' 9.5" (1.765 m)   Wt 148 lb (67.1 kg)   SpO2 98%   BMI 21.54 kg/m     Wt Readings from Last 3 Encounters:  11/20/23 148 lb (67.1 kg)  10/11/23 146 lb 1.9 oz (66.3 kg)  09/02/23 149 lb 12.8 oz (67.9 kg)     GEN: no distress CARD: RRR, No MRG RESP: No IWOB. CTAB.      ASSESSMENT:    1. Paroxysmal atrial fibrillation (HCC)    PLAN:    In order of problems listed above:  #Paroxysmal atrial fibrillation Symptomatic. Very low burden. On Eliquis for stroke prophylaxis We again discussed catheter ablation of atrial fibrillation and she is interested in proceeding if she were to have  another episode.  We discussed left atrial appendage occlusion with today's clinic appointment but given her CHA2DS2-VASc of only 2, she would not currently fit the inclusion criteria for this device.  We discussed the possibility of this changing in the future given recent data.  For now, she will stop her diltiazem and monitor her rhythm closely using her Apple watch while continuing her OAC.  Follow up 6 months with APP.    Signed, Steffanie Dunn, MD, [facility], [facility] 11/20/2023 8:33 PM    Electrophysiology [facility]

## 2023-11-20 NOTE — Patient Instructions (Signed)
Medication Instructions:  Your physician has recommended you make the following change in your medication:  1) STOP taking diltiazem  *If you need a refill on your cardiac medications before your next appointment, please call your pharmacy*  Follow-Up: At [facility], you and your health needs are our priority.  As part of our continuing mission to provide you with exceptional heart care, we have created designated Provider Care Teams.  These Care Teams include your primary Cardiologist (physician) and Advanced Practice Providers (APPs -  Physician Assistants and Nurse Practitioners) who all work together to provide you with the care you need, when you need it.   Your next appointment:   6 months  Provider:   You will see one of the following Advanced Practice Providers on your designated Care Team:   Francis Dowse, Charlott Holler [address] [place], [place] Sherie Don, NP Canary Brim, NP

## 2023-11-21 DIAGNOSIS — K080 Exfoliation of teeth due to systemic causes: Secondary | ICD-10-CM | POA: Diagnosis not present

## 2023-12-10 ENCOUNTER — Other Ambulatory Visit (HOSPITAL_COMMUNITY): Payer: Self-pay

## 2023-12-17 ENCOUNTER — Ambulatory Visit: Payer: Medicare Other | Admitting: Cardiology

## 2023-12-30 ENCOUNTER — Other Ambulatory Visit (HOSPITAL_COMMUNITY): Payer: Self-pay

## 2023-12-30 ENCOUNTER — Encounter: Payer: Self-pay | Admitting: Cardiology

## 2024-01-03 ENCOUNTER — Ambulatory Visit (HOSPITAL_COMMUNITY)
Admission: RE | Admit: 2024-01-03 | Discharge: 2024-01-03 | Disposition: A | Discharge status: Home / Self Care | Payer: Medicare Other | Source: Ambulatory Visit | Attending: Physician Assistant | Admitting: Physician Assistant

## 2024-01-03 ENCOUNTER — Encounter (HOSPITAL_COMMUNITY): Payer: Self-pay | Admitting: Physician Assistant

## 2024-01-03 VITALS — BP 90/62 | HR 65 | Ht 69.5 in | Wt 148.2 lb

## 2024-01-03 DIAGNOSIS — Z7901 Long term (current) use of anticoagulants: Secondary | ICD-10-CM | POA: Diagnosis not present

## 2024-01-03 DIAGNOSIS — Z79899 Other long term (current) drug therapy: Secondary | ICD-10-CM | POA: Diagnosis not present

## 2024-01-03 DIAGNOSIS — I480 Paroxysmal atrial fibrillation: Secondary | ICD-10-CM

## 2024-01-03 DIAGNOSIS — G4733 Obstructive sleep apnea (adult) (pediatric): Secondary | ICD-10-CM | POA: Diagnosis not present

## 2024-01-03 NOTE — Progress Notes (Signed)
Primary Care Physician: Everrett Coombe, DO Primary Cardiologist: None Electrophysiologist: Lanier Prude, MD  Referring Physician: ED   Donna Ramsey is a 73 y.o. female with a history of portal HTN, OSA, atrial fibrillation who presents for follow up in the [facility].  The patient was initially diagnosed with atrial fibrillation 01/17/23 after presenting to the ED with symptoms of palpitations. She was started on diltiazem and Eliquis. She spontaneously converted to SR 1-2 weeks later. She has followed up with Dr Lalla Brothers and was doing well with a low afib burden.  Patient presents today for follow up for atrial fibrillation. She reports that she went back into afib on 1/12 with symptoms of tachypalpitations and intermittent dizziness. She did take one dose of PRN diltiazem that day. She reverted back to SR on 1/15 (confirmed on Apple Watch). There were no specific triggers that she could identify. No bleeding issues on anticoagulation.   Today, she denies symptoms of palpitations, chest pain, shortness of breath, orthopnea, PND, lower extremity edema, dizziness, presyncope, syncope, snoring, daytime somnolence, bleeding, or neurologic sequela. The patient is tolerating medications without difficulties and is otherwise without complaint today.    Atrial Fibrillation Risk Factors:  she does have symptoms or diagnosis of sleep apnea. she does not have a history of rheumatic fever. she does have a history of alcohol use.   Atrial Fibrillation Management history:  Previous antiarrhythmic drugs: none Previous cardioversions: none Previous ablations: none Anticoagulation history: Eliquis  ROS- All systems are reviewed and negative except as per the HPI above.  Past Medical History:  Diagnosis Date   A-fib [facility])    Arthritis    Asplenia    Asthma    as a child   Family history of breast cancer    Hypertension    Liver disease, unspecified     heterogenous echotexture   Pneumonia    PONV (postoperative nausea and vomiting)    Portal hypertension (HCC)    Sleep apnea    mild  no cpap   Splenic infarct     Current Outpatient Medications  Medication Sig Dispense Refill   Acetylcysteine (NAC) 500 MG CAPS Take 500 mg by mouth 2 (two) times daily.     ALPHA LIPOIC ACID PO Take by mouth.     ALPRAZolam (XANAX) 0.25 MG tablet Take 1 tablet (0.25 mg total) by mouth at bedtime as needed for anxiety. 30 tablet 1   amoxicillin (AMOXIL) 500 MG capsule Take 2 capsules by mouth 1 hour prior to dental appointment and 2 capsules immediately following dental appointment 4 capsule PRN   apixaban (ELIQUIS) 5 MG TABS tablet Take 1 tablet (5 mg total) by mouth 2 (two) times daily. Take 2.5mg  twice daily on 6/11 and 6/12. Resume 5mg  twice daily on 6/13. 180 tablet 3   Ascorbic Acid (VITAMIN C) 500 MG CAPS Take 500 mg by mouth daily. liposoma     b complex vitamins capsule Take 1 capsule by mouth daily.     Cholecalciferol (VITAMIN D3) 125 MCG (5000 UT) CAPS Take 5,000 Units by mouth daily.     co-enzyme Q-10 30 MG capsule Take 100 mg by mouth daily.     diltiazem (CARDIZEM CD) 120 MG 24 hr capsule Take 120 mg by mouth daily.     diltiazem (CARDIZEM) 30 MG tablet Take 1 tablet every 4 hours AS NEEDED for AFIB heart rate >100 as long as top BP >100. 30 tablet 1  GLYCINE PO Take 3,000 mg by mouth at bedtime.     lidocaine 4 % Place 1 patch onto the skin daily as needed (pain).     MAGNESIUM PO Take 280 mg by mouth at bedtime.     meloxicam (MOBIC) 7.5 MG tablet Take 1 tablet (7.5 mg total) by mouth daily. 30 tablet 0   milk thistle 175 MG tablet Take 380 mg by mouth 2 (two) times daily.     Nutritional Supplements (CHLORELLA-SPIRULINA COMPLEX PO) Take 6 tablets by mouth daily.     Omega-3 Fatty Acids (FISH OIL PO) Take 640 mg by mouth 2 (two) times daily.     OVER THE COUNTER MEDICATION Take 30 mg by mouth 2 (two) times daily. Bio-Quercetin supplement      oxymetazoline (AFRIN) 0.05 % nasal spray Place 1 spray into both nostrils at bedtime as needed for congestion.     triamcinolone cream (KENALOG) 0.1 % Apply topically once a day as needed (eczema). 450 g 1   triamcinolone cream (KENALOG) 0.1 % Apply to affected area once daily as needed for excema. 454 g 1   Vitamin D-Vitamin K (VITAMIN K2-VITAMIN D3 PO) Take 1 tablet by mouth daily. Vitamin d 4000 units Vitamin K2 100 mg     Zinc Sulfate (ZINC 15 PO) Take 15 mg by mouth daily.     No current facility-administered medications for this encounter.    Physical Exam: BP 90/62   Pulse 65   Ht 5' 9.5" (1.765 m)   Wt 67.2 kg   BMI 21.57 kg/m   GEN: Well nourished, well developed in no acute distress CARDIAC: Regular rate and rhythm, no murmurs, rubs, gallops RESPIRATORY:  Clear to auscultation without rales, wheezing or rhonchi  ABDOMEN: Soft, non-tender, non-distended EXTREMITIES:  No edema; No deformity   Wt Readings from Last 3 Encounters:  01/03/24 67.2 kg  11/20/23 67.1 kg  10/11/23 66.3 kg     EKG today demonstrates  SR Vent. rate 65 BPM PR interval 180 ms QRS duration 90 ms QT/QTcB 428/445 ms   Echo 03/04/23 demonstrated   1. Left ventricular ejection fraction, by estimation, is 60 to 65%. The  left ventricle has normal function. The left ventricle has no regional  wall motion abnormalities. Left ventricular diastolic parameters were  normal.   2. Right ventricular systolic function is normal. The right ventricular  size is normal. There is normal pulmonary artery systolic pressure.   3. There appears to be a left to right atrial communication seen only in  the four chamber view (Img 64). Given coronary sinus dilation and right  atrial enlargement future CT PV is planned for evaluation.   4. Left atrial size was mildly dilated.   5. The mitral valve is normal in structure. No evidence of mitral valve  regurgitation. No evidence of mitral stenosis.   6. Mild  thickening of NCC. The aortic valve is tricuspid. Aortic valve  regurgitation is trivial.   7. The inferior vena cava is normal in size with greater than 50%  respiratory variability, suggesting right atrial pressure of 3 mmHg.   8. Evidence of atrial level shunting detected by color flow Doppler.    CHA2DS2-VASc Score = 2  The patient's score is based upon: CHF History: 0 HTN History: 0 Diabetes History: 0 Stroke History: 0 Vascular Disease History: 0 Age Score: 1 Gender Score: 1       ASSESSMENT AND PLAN: Paroxysmal Atrial Fibrillation (ICD10:  I48.0) The patient's CHA2DS2-VASc  score is 2, indicating a 2.2% annual risk of stroke.   Patient spontaneously converted to SR. There were no specific triggers that she could identify. We discussed rhythm control options including changing diltiazem to metoprolol, starting AAD, or afib ablation. Patient would prefer to keep her current medications for now.  Continue Eliquis 5 mg BID Continue diltiazem 120 mg daily with 30 mg PRN q 4 hours for heart racing Apple watch for home monitoring.   OSA  The importance of adequate treatment of sleep apnea was discussed today in order to improve our ability to maintain sinus rhythm long term. Patient deferred CPAP titration until after knee surgery. Will have sleep medicine reach out to her to get scheduled.     Follow up in the AF clinic in 5-6 months.    Total time of encounter: 35 minutes total time of encounter, including chart review, face-to-face patient care, coordination of care and counseling regarding rhythm control options for atrial fibrillation. We also discussed smart device technology for long term monitoring.       Jorja Loa PA-C Afib Clinic [facility] [address] [place], [place] [address] [phone]

## 2024-01-04 ENCOUNTER — Encounter: Payer: Self-pay | Admitting: Cardiology

## 2024-01-06 ENCOUNTER — Other Ambulatory Visit (HOSPITAL_COMMUNITY): Payer: Self-pay | Admitting: *Deleted

## 2024-01-06 ENCOUNTER — Telehealth: Payer: Self-pay

## 2024-01-06 ENCOUNTER — Other Ambulatory Visit (HOSPITAL_COMMUNITY): Payer: Self-pay

## 2024-01-06 DIAGNOSIS — I480 Paroxysmal atrial fibrillation: Secondary | ICD-10-CM

## 2024-01-06 DIAGNOSIS — G4733 Obstructive sleep apnea (adult) (pediatric): Secondary | ICD-10-CM

## 2024-01-06 MED ORDER — METOPROLOL SUCCINATE ER 25 MG PO TB24
12.5000 mg | ORAL_TABLET | Freq: Every day | ORAL | 3 refills | Status: DC
  Filled 2024-01-06: qty 15, 30d supply, fill #0
  Filled 2024-01-28 (×2): qty 15, 30d supply, fill #1

## 2024-01-06 NOTE — Telephone Encounter (Signed)
-----   Message from Nurse Stacy C sent at 01/03/2024  3:16 PM EST ----- Pt is now ready to proceed with inlab titration

## 2024-01-21 DIAGNOSIS — H02831 Dermatochalasis of right upper eyelid: Secondary | ICD-10-CM | POA: Diagnosis not present

## 2024-01-21 DIAGNOSIS — H02834 Dermatochalasis of left upper eyelid: Secondary | ICD-10-CM | POA: Diagnosis not present

## 2024-01-28 ENCOUNTER — Other Ambulatory Visit (HOSPITAL_COMMUNITY): Payer: Self-pay | Admitting: Physician Assistant

## 2024-01-28 ENCOUNTER — Other Ambulatory Visit (HOSPITAL_COMMUNITY): Payer: Self-pay

## 2024-01-28 DIAGNOSIS — H35373 Puckering of macula, bilateral: Secondary | ICD-10-CM | POA: Diagnosis not present

## 2024-01-28 DIAGNOSIS — H35342 Macular cyst, hole, or pseudohole, left eye: Secondary | ICD-10-CM | POA: Diagnosis not present

## 2024-01-28 DIAGNOSIS — H02403 Unspecified ptosis of bilateral eyelids: Secondary | ICD-10-CM | POA: Diagnosis not present

## 2024-01-28 DIAGNOSIS — H2513 Age-related nuclear cataract, bilateral: Secondary | ICD-10-CM | POA: Diagnosis not present

## 2024-01-28 MED ORDER — METOPROLOL SUCCINATE ER 25 MG PO TB24
12.5000 mg | ORAL_TABLET | Freq: Every day | ORAL | 1 refills | Status: DC
  Filled 2024-01-28 – 2024-01-29 (×4): qty 45, 90d supply, fill #0
  Filled 2024-04-06 – 2024-04-10 (×2): qty 45, 90d supply, fill #1

## 2024-01-29 ENCOUNTER — Other Ambulatory Visit (HOSPITAL_COMMUNITY): Payer: Self-pay

## 2024-02-06 ENCOUNTER — Encounter: Payer: Self-pay | Admitting: Cardiology

## 2024-02-10 ENCOUNTER — Other Ambulatory Visit (HOSPITAL_COMMUNITY): Payer: Self-pay

## 2024-02-10 MED ORDER — AMOXICILLIN 500 MG PO CAPS
2000.0000 mg | ORAL_CAPSULE | ORAL | 0 refills | Status: DC
  Filled 2024-02-10: qty 12, 21d supply, fill #0

## 2024-02-12 DIAGNOSIS — K080 Exfoliation of teeth due to systemic causes: Secondary | ICD-10-CM | POA: Diagnosis not present

## 2024-02-24 DIAGNOSIS — K080 Exfoliation of teeth due to systemic causes: Secondary | ICD-10-CM | POA: Diagnosis not present

## 2024-02-28 ENCOUNTER — Telehealth: Payer: Self-pay

## 2024-02-28 NOTE — Telephone Encounter (Signed)
**Note De-Identified Liridona Mashaw Obfuscation** Per the [facility] provider portal: The following solutions for the service date entered do not require Pre-Authorization by [employer]: CPT Code: 81191 (CPAP Titration).  I have transferred the CPAP Titration order to the sleep lab so they can contact the pt to schedule the test.

## 2024-03-02 DIAGNOSIS — Z9229 Personal history of other drug therapy: Secondary | ICD-10-CM | POA: Diagnosis not present

## 2024-03-02 DIAGNOSIS — H57812 Brow ptosis, left: Secondary | ICD-10-CM | POA: Diagnosis not present

## 2024-03-10 DIAGNOSIS — M25561 Pain in right knee: Secondary | ICD-10-CM | POA: Diagnosis not present

## 2024-04-02 DIAGNOSIS — D485 Neoplasm of uncertain behavior of skin: Secondary | ICD-10-CM | POA: Diagnosis not present

## 2024-04-02 DIAGNOSIS — L821 Other seborrheic keratosis: Secondary | ICD-10-CM | POA: Diagnosis not present

## 2024-04-02 DIAGNOSIS — H53483 Generalized contraction of visual field, bilateral: Secondary | ICD-10-CM | POA: Diagnosis not present

## 2024-04-02 DIAGNOSIS — R252 Cramp and spasm: Secondary | ICD-10-CM | POA: Diagnosis not present

## 2024-04-02 DIAGNOSIS — H02834 Dermatochalasis of left upper eyelid: Secondary | ICD-10-CM | POA: Diagnosis not present

## 2024-04-02 DIAGNOSIS — H02423 Myogenic ptosis of bilateral eyelids: Secondary | ICD-10-CM | POA: Diagnosis not present

## 2024-04-02 DIAGNOSIS — L814 Other melanin hyperpigmentation: Secondary | ICD-10-CM | POA: Diagnosis not present

## 2024-04-02 DIAGNOSIS — L720 Epidermal cyst: Secondary | ICD-10-CM | POA: Diagnosis not present

## 2024-04-02 DIAGNOSIS — D472 Monoclonal gammopathy: Secondary | ICD-10-CM | POA: Diagnosis not present

## 2024-04-02 DIAGNOSIS — R234 Changes in skin texture: Secondary | ICD-10-CM | POA: Diagnosis not present

## 2024-04-02 DIAGNOSIS — H02831 Dermatochalasis of right upper eyelid: Secondary | ICD-10-CM | POA: Diagnosis not present

## 2024-04-02 DIAGNOSIS — D1801 Hemangioma of skin and subcutaneous tissue: Secondary | ICD-10-CM | POA: Diagnosis not present

## 2024-04-06 ENCOUNTER — Other Ambulatory Visit (HOSPITAL_COMMUNITY): Payer: Self-pay

## 2024-04-06 ENCOUNTER — Other Ambulatory Visit: Payer: Self-pay | Admitting: Cardiology

## 2024-04-06 NOTE — Telephone Encounter (Signed)
 Prescription refill request for Eliquis  received. Indication: PAF Last office visit: 01/03/24  C Fenton PA Scr: 0.58 on 10/11/23  Epic Age: 73 Weight: 67.2kg  Based on above findings Eliquis  5mg  twice daily is the appropriate dose.  Refill approved.

## 2024-04-08 ENCOUNTER — Other Ambulatory Visit (HOSPITAL_COMMUNITY): Payer: Self-pay

## 2024-04-09 DIAGNOSIS — D704 Cyclic neutropenia: Secondary | ICD-10-CM | POA: Diagnosis not present

## 2024-04-09 DIAGNOSIS — D472 Monoclonal gammopathy: Secondary | ICD-10-CM | POA: Diagnosis not present

## 2024-04-10 ENCOUNTER — Other Ambulatory Visit (HOSPITAL_COMMUNITY): Payer: Self-pay

## 2024-04-13 ENCOUNTER — Other Ambulatory Visit (HOSPITAL_COMMUNITY): Payer: Self-pay

## 2024-04-13 MED ORDER — AMOXICILLIN 500 MG PO CAPS
2000.0000 mg | ORAL_CAPSULE | ORAL | 0 refills | Status: AC
  Filled 2024-04-13: qty 12, 3d supply, fill #0

## 2024-04-17 ENCOUNTER — Other Ambulatory Visit (HOSPITAL_COMMUNITY): Payer: Self-pay

## 2024-05-07 DIAGNOSIS — H02831 Dermatochalasis of right upper eyelid: Secondary | ICD-10-CM | POA: Diagnosis not present

## 2024-05-07 DIAGNOSIS — H02423 Myogenic ptosis of bilateral eyelids: Secondary | ICD-10-CM | POA: Diagnosis not present

## 2024-05-07 DIAGNOSIS — H57813 Brow ptosis, bilateral: Secondary | ICD-10-CM | POA: Diagnosis not present

## 2024-05-07 DIAGNOSIS — H02834 Dermatochalasis of left upper eyelid: Secondary | ICD-10-CM | POA: Diagnosis not present

## 2024-06-09 ENCOUNTER — Telehealth: Payer: Self-pay

## 2024-06-09 ENCOUNTER — Encounter (INDEPENDENT_AMBULATORY_CARE_PROVIDER_SITE_OTHER): Payer: Self-pay | Admitting: Family Medicine

## 2024-06-09 ENCOUNTER — Other Ambulatory Visit (HOSPITAL_COMMUNITY): Payer: Self-pay

## 2024-06-09 DIAGNOSIS — L259 Unspecified contact dermatitis, unspecified cause: Secondary | ICD-10-CM

## 2024-06-09 DIAGNOSIS — K766 Portal hypertension: Secondary | ICD-10-CM | POA: Diagnosis not present

## 2024-06-09 MED ORDER — CLOBETASOL PROPIONATE 0.05 % EX CREA
1.0000 | TOPICAL_CREAM | Freq: Two times a day (BID) | CUTANEOUS | 0 refills | Status: AC
  Filled 2024-06-09: qty 30, 30d supply, fill #0

## 2024-06-09 MED ORDER — BETAMETHASONE DIPROPIONATE 0.05 % EX CREA
1.0000 | TOPICAL_CREAM | Freq: Two times a day (BID) | CUTANEOUS | 0 refills | Status: AC
  Filled 2024-06-09 – 2024-07-02 (×2): qty 45, 30d supply, fill #0

## 2024-06-09 NOTE — Telephone Encounter (Signed)
   Pre-operative Risk Assessment    Patient Name: Donna Ramsey  DOB: 04/25/1951 MRN: [id number]   Date of last office visit: 11/20/23 OLE HOLTS, MD Date of next office visit: NONE   Request for Surgical Clearance    Procedure:  FUNCTIONAL PTOSIS REPAIR, COSMETIC UPPER & LOWER BLEPHAROPLASTY  Date of Surgery:  Clearance 08/04/24                                Surgeon:  NOT INDICATED Surgeon's Group or Practice Name:  [facility] Phone number:  [phone] Fax number:  [phone]   Type of Clearance Requested:   - Medical  - Pharmacy:  Hold Apixaban  (Eliquis )     Type of Anesthesia:  Not Indicated   Additional requests/questions:    Signed, Lucie DELENA Ku   06/09/2024, 10:50 AM

## 2024-06-09 NOTE — Telephone Encounter (Signed)
 Please see the MyChart message reply(ies) for my assessment and plan.    This patient gave consent for this Medical Advice Message and is aware that it may result in a bill to [employer], as well as the possibility of receiving a bill for a co-payment or deductible. They are an established patient, but are not seeking medical advice exclusively about a problem treated during an in person or video visit in the last seven days. I did not recommend an in person or video visit within seven days of my reply.    I spent a total of 6 minutes cumulative time within 7 days through [employer].  Everrett Coombe, DO

## 2024-06-11 ENCOUNTER — Ambulatory Visit (HOSPITAL_COMMUNITY)
Admission: RE | Admit: 2024-06-11 | Discharge: 2024-06-11 | Disposition: A | Discharge status: Home / Self Care | Payer: Medicare Other | Source: Ambulatory Visit | Attending: Physician Assistant | Admitting: Physician Assistant

## 2024-06-11 VITALS — BP 112/64 | HR 58 | Ht 69.5 in | Wt 148.4 lb

## 2024-06-11 DIAGNOSIS — I4891 Unspecified atrial fibrillation: Secondary | ICD-10-CM

## 2024-06-11 DIAGNOSIS — I480 Paroxysmal atrial fibrillation: Secondary | ICD-10-CM | POA: Diagnosis not present

## 2024-06-11 NOTE — Progress Notes (Signed)
 Primary Care Physician: Alvia Bring, DO Primary Cardiologist: None Electrophysiologist: OLE ONEIDA HOLTS, MD  Referring Physician: ED   Donna Ramsey is a 73 y.o. female with a history of portal HTN, OSA, atrial fibrillation who presents for follow up in the [facility].  The patient was initially diagnosed with atrial fibrillation 01/17/23 after presenting to the ED with symptoms of palpitations. She was started on diltiazem  and Eliquis . She spontaneously converted to SR 1-2 weeks later. She has followed up with Dr HOLTS and was doing well with a low afib burden.  Patient returns for follow up for atrial fibrillation. She reports that she has done well since her last visit with no interim symptoms of afib. No bleeding issues on anticoagulation. She has not completed a CPAP titration but is using an oral device recommended by her dentist.   Today, she  denies symptoms of palpitations, chest pain, shortness of breath, orthopnea, PND, lower extremity edema, dizziness, presyncope, syncope, bleeding, or neurologic sequela. The patient is tolerating medications without difficulties and is otherwise without complaint today.    Atrial Fibrillation Risk Factors:  she does have symptoms or diagnosis of sleep apnea. she does not have a history of rheumatic fever. she does have a history of alcohol  use.   Atrial Fibrillation Management history:  Previous antiarrhythmic drugs: none Previous cardioversions: none Previous ablations: none Anticoagulation history: Eliquis   ROS- All systems are reviewed and negative except as per the HPI above.  Past Medical History:  Diagnosis Date   A-fib [facility])    Arthritis    Asplenia    Asthma    as a child   Family history of breast cancer    Hypertension    Liver disease, unspecified    heterogenous echotexture   Pneumonia    PONV (postoperative nausea and vomiting)    Portal hypertension (HCC)    Sleep apnea     mild  no cpap   Splenic infarct     Current Outpatient Medications  Medication Sig Dispense Refill   Acetylcysteine (NAC) 500 MG CAPS Take 500 mg by mouth 2 (two) times daily.     ALPHA LIPOIC ACID PO Take by mouth. (Patient taking differently: Take 2 capsules by mouth every morning.)     amoxicillin  (AMOXIL ) 500 MG capsule Take 2 capsules by mouth 1 hour prior to dental appointment and 2 capsules immediately following dental appointment 4 capsule PRN   amoxicillin  (AMOXIL ) 500 MG capsule Take 4 capsules by mouth 1 hour prior to dental work 12 capsule 0   apixaban  (ELIQUIS ) 5 MG TABS tablet TAKE 1 TABLET TWICE A DAY 180 tablet 1   Ascorbic Acid (VITAMIN C) 500 MG CAPS Take 500 mg by mouth daily. liposoma     b complex vitamins capsule Take 1 capsule by mouth daily.     betamethasone dipropionate 0.05 % cream Apply liberally to affected skin 2 (two) times daily. 45 g 0   Cholecalciferol (VITAMIN D3) 125 MCG (5000 UT) CAPS Take 5,000 Units by mouth daily.     clobetasol cream (TEMOVATE) 0.05 % Apply 1 Application topically 2 (two) times daily. 30 g 0   co-enzyme Q-10 30 MG capsule Take 100 mg by mouth daily.     Collagen-Vitamin C-Biotin (COLLAGEN PO) 1 scoop daily     diltiazem  (CARDIZEM ) 30 MG tablet Take 1 tablet every 4 hours AS NEEDED for AFIB heart rate >100 as long as top BP >100. 30 tablet 1  GLYCINE PO Take 3,000 mg by mouth at bedtime.     lidocaine  4 % Place 1 patch onto the skin daily as needed (pain).     MAGNESIUM PO Take 280 mg by mouth at bedtime. (Patient taking differently: Take 3 capsules by mouth at bedtime. Glycinate- not sure about the dosage)     metoprolol  succinate (TOPROL  XL) 25 MG 24 hr tablet Take 0.5 tablets (12.5 mg total) by mouth at bedtime. (Patient taking differently: Take 12.5 mg by mouth every morning.) 45 tablet 1   milk thistle 175 MG tablet Take 380 mg by mouth 2 (two) times daily.     Omega-3 Fatty Acids (FISH OIL PO) Take 640 mg by mouth 2 (two)  times daily.     OnabotulinumtoxinA (BOTOX IJ) In her eyebrow- every 4-6 months     OVER THE COUNTER MEDICATION Take 30 mg by mouth 2 (two) times daily. Bio-Quercetin supplement     oxymetazoline (AFRIN) 0.05 % nasal spray Place 1 spray into both nostrils at bedtime as needed for congestion.     Oxymetazoline HCl (UPNEEQ) 0.1 % SOLN INSTILL 1 DROP INTO AFFECTED EYE(S) BY OPHTHALMIC ROUTE ONCE DAILY     tretinoin (RETIN-A) 0.05 % cream Apply topically at bedtime.     triamcinolone  cream (KENALOG ) 0.1 % Apply topically once a day as needed (eczema). 450 g 1   triamcinolone  cream (KENALOG ) 0.1 % Apply to affected area once daily as needed for excema. 454 g 1   Vitamin D -Vitamin K (VITAMIN K2-VITAMIN D3 PO) Take 1 tablet by mouth daily. Vitamin d  4000 units Vitamin K2 100 mg     Zinc Sulfate (ZINC 15 PO) Take 15 mg by mouth daily.     amoxicillin  (AMOXIL ) 500 MG capsule Take 4 capsules (2,000 mg total) by mouth 1 hour prior to dental work. 12 capsule 0   No current facility-administered medications for this encounter.    Physical Exam: BP 112/64   Pulse (!) 58   Ht 5' 9.5 (1.765 m)   Wt 67.3 kg   BMI 21.60 kg/m   GEN: Well nourished, well developed in no acute distress CARDIAC: Regular rate and rhythm, no murmurs, rubs, gallops RESPIRATORY:  Clear to auscultation without rales, wheezing or rhonchi  ABDOMEN: Soft, non-tender, non-distended EXTREMITIES:  No edema; No deformity    Wt Readings from Last 3 Encounters:  06/11/24 67.3 kg  01/03/24 67.2 kg  11/20/23 67.1 kg     EKG today demonstrates  SB Vent. rate 58 BPM PR interval 182 ms QRS duration 90 ms QT/QTcB 434/426 ms   Echo 03/04/23 demonstrated   1. Left ventricular ejection fraction, by estimation, is 60 to 65%. The  left ventricle has normal function. The left ventricle has no regional  wall motion abnormalities. Left ventricular diastolic parameters were  normal.   2. Right ventricular systolic function is normal.  The right ventricular  size is normal. There is normal pulmonary artery systolic pressure.   3. There appears to be a left to right atrial communication seen only in  the four chamber view (Img 64). Given coronary sinus dilation and right  atrial enlargement future CT PV is planned for evaluation.   4. Left atrial size was mildly dilated.   5. The mitral valve is normal in structure. No evidence of mitral valve  regurgitation. No evidence of mitral stenosis.   6. Mild thickening of NCC. The aortic valve is tricuspid. Aortic valve  regurgitation is trivial.   7.  The inferior vena cava is normal in size with greater than 50%  respiratory variability, suggesting right atrial pressure of 3 mmHg.   8. Evidence of atrial level shunting detected by color flow Doppler.    CHA2DS2-VASc Score = 2  The patient's score is based upon: CHF History: 0 HTN History: 0 Diabetes History: 0 Stroke History: 0 Vascular Disease History: 0 Age Score: 1 Gender Score: 1       ASSESSMENT AND PLAN: Paroxysmal Atrial Fibrillation (ICD10:  I48.0) The patient's CHA2DS2-VASc score is 2, indicating a 2.2% annual risk of stroke.   Patient appears to be maintaining SR Continue Eliquis  5 mg BID Continue Toprol  12.5 mg daily Continue diltiazem  30 mg PRN q 4 hours for heart racing Apple Watch for home monitoring  OSA  Patient declined CPAP titration.  She is using an oral device recommended by her dentist.     Follow up with Dr Cindie in 6 months. AF clinic in one year.       Daril Kicks PA-C [facility] [address] [place], [place] [address] [phone]

## 2024-06-11 NOTE — Telephone Encounter (Signed)
 Patient with diagnosis of atrial fibrillation on Eliquis  for anticoagulation.    Procedure:  FUNCTIONAL PTOSIS REPAIR, COSMETIC UPPER & LOWER BLEPHAROPLASTY   Date of Surgery:  Clearance 08/04/24      CHA2DS2-VASc Score = 2   This indicates a 2.2% annual risk of stroke. The patient's score is based upon: CHF History: 0 HTN History: 0 Diabetes History: 0 Stroke History: 0 Vascular Disease History: 0 Age Score: 1 Gender Score: 1      CrCl 96  Platelet count 315   Patient has not had an Afib/aflutter ablation within the last 3 months or DCCV within the last 30 days  Per office protocol, patient can hold Eliquis  for 2 days prior to procedure.   Patient will not need bridging with Lovenox (enoxaparin) around procedure.  **This guidance is not considered finalized until pre-operative APP has relayed final recommendations.**

## 2024-06-12 NOTE — Telephone Encounter (Signed)
 Tried calling patient to schedule televisit no answer left a detailed message to call back and schedule

## 2024-06-12 NOTE — Telephone Encounter (Signed)
 Jackee   Name: Donna BOCCIO  DOB: Mar 11, 1951  MRN: [id number]  Primary Cardiologist: None   Preoperative team, please contact this patient and set up a phone call appointment for further preoperative risk assessment. Please obtain consent and complete medication review. Last seen by [name], NP, 7/311/2024. Thank you for your help.  I confirm that guidance regarding antiplatelet and oral anticoagulation therapy has been completed and, if necessary, noted below.  CHA2DS2-VASc Score = 2   This indicates a 2.2% annual risk of stroke. The patient's score is based upon: CHF History: 0 HTN History: 0 Diabetes History: 0 Stroke History: 0 Vascular Disease History: 0 Age Score: 1 Gender Score: 1      CrCl 96             Platelet count 315          Patient has not had an Afib/aflutter ablation within the last 3 months or DCCV within the last 30 days   Per office protocol, patient can hold Eliquis  for 2 days prior to procedure.   Patient will not need bridging with Lovenox (enoxaparin) around procedure.    I also confirmed the patient resides in the state of [place] . As per [facility] telemedicine laws, the patient must reside in the state in which the provider is licensed.   Lamarr Satterfield, NP 06/12/2024, 2:17 PM [facility]

## 2024-06-16 ENCOUNTER — Telehealth: Payer: Self-pay

## 2024-06-16 NOTE — Telephone Encounter (Signed)
 Patient is scheduled for a tele visit 07/22/2024 with [name], PAC. Med rec and consent are done.     Patient Consent for Virtual Visit        [name] has provided verbal consent on 06/16/2024 for a virtual visit (video or telephone).   CONSENT FOR VIRTUAL VISIT FOR:  Donna Ramsey  By participating in this virtual visit I agree to the following:  I hereby voluntarily request, consent and authorize [facility] and its employed or contracted physicians, physician assistants, nurse practitioners or other licensed health care professionals (the Practitioner), to provide me with telemedicine health care services (the "Services) as deemed necessary by the treating Practitioner. I acknowledge and consent to receive the Services by the Practitioner via telemedicine. I understand that the telemedicine visit will involve communicating with the Practitioner through live audiovisual communication technology and the disclosure of certain medical information by electronic transmission. I acknowledge that I have been given the opportunity to request an in-person assessment or other available alternative prior to the telemedicine visit and am voluntarily participating in the telemedicine visit.  I understand that I have the right to withhold or withdraw my consent to the use of telemedicine in the course of my care at any time, without affecting my right to future care or treatment, and that the Practitioner or I may terminate the telemedicine visit at any time. I understand that I have the right to inspect all information obtained and/or recorded in the course of the telemedicine visit and may receive copies of available information for a reasonable fee.  I understand that some of the potential risks of receiving the Services via telemedicine include:  Delay or interruption in medical evaluation due to technological equipment failure or disruption; Information transmitted may not be  sufficient (e.g. poor resolution of images) to allow for appropriate medical decision making by the Practitioner; and/or  In rare instances, security protocols could fail, causing a breach of personal health information.  Furthermore, I acknowledge that it is my responsibility to provide information about my medical history, conditions and care that is complete and accurate to the best of my ability. I acknowledge that Practitioner's advice, recommendations, and/or decision may be based on factors not within their control, such as incomplete or inaccurate data provided by me or distortions of diagnostic images or specimens that may result from electronic transmissions. I understand that the practice of medicine is not an exact science and that Practitioner makes no warranties or guarantees regarding treatment outcomes. I acknowledge that a copy of this consent can be made available to me via my patient portal [facility] MyChart), or I can request a printed copy by calling the office of [facility].    I understand that my insurance will be billed for this visit.   I have read or had this consent read to me. I understand the contents of this consent, which adequately explains the benefits and risks of the Services being provided via telemedicine.  I have been provided ample opportunity to ask questions regarding this consent and the Services and have had my questions answered to my satisfaction. I give my informed consent for the services to be provided through the use of telemedicine in my medical care

## 2024-06-16 NOTE — Telephone Encounter (Signed)
 Patient is scheduled for a tele visit 07/22/2024 with [name], PAC. Med rec and consent are done.

## 2024-06-18 ENCOUNTER — Ambulatory Visit

## 2024-06-18 ENCOUNTER — Encounter: Payer: Self-pay | Admitting: Family Medicine

## 2024-06-18 ENCOUNTER — Ambulatory Visit: Payer: Self-pay | Admitting: Family Medicine

## 2024-06-18 ENCOUNTER — Ambulatory Visit: Admitting: Family Medicine

## 2024-06-18 VITALS — BP 96/58 | HR 77 | Ht 69.5 in | Wt 149.0 lb

## 2024-06-18 DIAGNOSIS — M778 Other enthesopathies, not elsewhere classified: Secondary | ICD-10-CM | POA: Diagnosis not present

## 2024-06-18 DIAGNOSIS — M19011 Primary osteoarthritis, right shoulder: Secondary | ICD-10-CM | POA: Diagnosis not present

## 2024-06-18 DIAGNOSIS — W19XXXA Unspecified fall, initial encounter: Secondary | ICD-10-CM

## 2024-06-18 DIAGNOSIS — M47812 Spondylosis without myelopathy or radiculopathy, cervical region: Secondary | ICD-10-CM | POA: Diagnosis not present

## 2024-06-18 DIAGNOSIS — M25511 Pain in right shoulder: Secondary | ICD-10-CM

## 2024-06-18 DIAGNOSIS — M542 Cervicalgia: Secondary | ICD-10-CM

## 2024-06-18 DIAGNOSIS — M4802 Spinal stenosis, cervical region: Secondary | ICD-10-CM | POA: Diagnosis not present

## 2024-06-18 DIAGNOSIS — M25519 Pain in unspecified shoulder: Secondary | ICD-10-CM | POA: Diagnosis not present

## 2024-06-18 DIAGNOSIS — R609 Edema, unspecified: Secondary | ICD-10-CM | POA: Diagnosis not present

## 2024-06-18 NOTE — Assessment & Plan Note (Signed)
 Xrays of R shoulder ordered.  Continue icing of shoulder.

## 2024-06-18 NOTE — Assessment & Plan Note (Signed)
 Xrays of cervical spine ordered.

## 2024-06-18 NOTE — Progress Notes (Signed)
 Donna Ramsey - 73 y.o. female MRN [id number]  Date of birth: June 30, 1951  Subjective Chief Complaint  Patient presents with   Fall   Shoulder Injury    HPI [name] is a 73 y.o. female here today with complaint of R shoulder pain.  She fell yesterday and landed on her R shoulder.  She tripped over a wire from her underground fence where part of her driveway had eroded away.  She is having pain in the R shoulder and neck area.  Denies significant weakness. Has some pain with abduction.  She denies radiation of pain down the arm, numbness or tingling.  So far she has tried ice to the area.   ROS:  A comprehensive ROS was completed and negative except as noted per HPI  Allergies  Allergen Reactions   Clindamycin Hives   Codeine Rash   Pentazocine Hives   Sulfamethoxazole-Trimethoprim Itching and Rash     2017 Reaction: rash, itching 03/16/2021: faint rash and itching 30 min after bactrim 400/80 oral challenge.    Doxycycline Other (See Comments)    Itching without rash near end of course    Shellfish Allergy Other (See Comments)    Only scallops   Levofloxacin Rash and Other (See Comments)    tendonitis    Neomycin Itching   Sulfa Antibiotics Rash    Past Medical History:  Diagnosis Date   A-fib (HCC)    Allergy    Arthritis    Asplenia    Asthma    as a child   Family history of breast cancer    GERD (gastroesophageal reflux disease)    Hypertension    Liver disease, unspecified    heterogenous echotexture   Pneumonia    PONV (postoperative nausea and vomiting)    Portal hypertension (HCC)    Sleep apnea    mild  no cpap   Splenic infarct     Past Surgical History:  Procedure Laterality Date   ABDOMINAL HYSTERECTOMY     BREAST BIOPSY Bilateral 50 yrs ago   benign   CHONDROPLASTY Left 07/28/2020   Procedure: CHONDROPLASTY LEFT KNEE;  Surgeon: Cristy Bonner DASEN, MD;  Location: [facility];  Service: Orthopedics;  Laterality: Left;    HERNIA REPAIR     INGUINAL HERNIA REPAIR Right 05/10/2021   Procedure: RIGHT INGUINAL HERNIA REPAIR WITH MESH;  Surgeon: Vernetta Berg, MD;  Location: [facility];  Service: General;  Laterality: Right;   KNEE ARTHROSCOPY WITH LATERAL MENISECTOMY Left 07/28/2020   Procedure: LEFT KNEE ARTHROSCOPY WITH LATERAL MENISECTOMY;  Surgeon: Cristy Bonner DASEN, MD;  Location: [facility];  Service: Orthopedics;  Laterality: Left;   KNEE ARTHROSCOPY WITH MEDIAL MENISECTOMY Left 07/28/2020   Procedure: LEFT KNEE ARTHROSCOPY WITH MEDIAL MENISECTOMY;  Surgeon: Cristy Bonner DASEN, MD;  Location:  SURGERY CENTER;  Service: Orthopedics;  Laterality: Left;   SPLENECTOMY, TOTAL     TOTAL KNEE ARTHROPLASTY Left 05/27/2023   Procedure: TOTAL KNEE ARTHROPLASTY;  Surgeon: Edna Toribio LABOR, MD;  Location: [facility];  Service: Orthopedics;  Laterality: Left;   TUBAL LIGATION  04/1981    Social History   Socioeconomic History   Marital status: Widowed    Spouse name: Not on file   Number of children: 3   Years of education: 16   Highest education level: Bachelor's degree (e.g., BA, AB, BS)  Occupational History    Comment: Retired Teacher, early years/pre  Tobacco Use   Smoking status: Never  Smokeless tobacco: Never   Tobacco comments:    Never smoked 01/03/24  Vaping Use   Vaping status: Never Used  Substance and Sexual Activity   Alcohol  use: Yes    Alcohol /week: 7.0 standard drinks of alcohol     Types: 7 Glasses of wine per week    Comment: 1 glass of wine nightly 01/03/24   Drug use: Never   Sexual activity: Never  Other Topics Concern   Not on file  Social History Narrative   Lives alone but next to her daughter. She is walking 3-5 miles per day. She likes to read, cook and do yard work.   Social Drivers of Corporate investment banker Strain: Low Risk  (06/18/2024)   Overall Financial Resource Strain (CARDIA)    Difficulty of Paying Living Expenses: Not hard at all  Food Insecurity: No Food  Insecurity (06/18/2024)   Hunger Vital Sign    Worried About Running Out of Food in the Last Year: Never true    Ran Out of Food in the Last Year: Never true  Transportation Needs: No Transportation Needs (06/18/2024)   PRAPARE - Administrator, Civil Service (Medical): No    Lack of Transportation (Non-Medical): No  Physical Activity: Sufficiently Active (06/18/2024)   Exercise Vital Sign    Days of Exercise per Week: 5 days    Minutes of Exercise per Session: 90 min  Stress: No Stress Concern Present (06/18/2024)   [employer] of Occupational Health - Occupational Stress Questionnaire    Feeling of Stress: Only a little  Social Connections: Moderately Integrated (06/18/2024)   Social Connection and Isolation Panel    Frequency of Communication with Friends and Family: Three times a week    Frequency of Social Gatherings with Friends and Family: Three times a week    Attends Religious Services: More than 4 times per year    Active Member of Clubs or Organizations: Yes    Attends Banker Meetings: More than 4 times per year    Marital Status: Widowed    Family History  Problem Relation Age of Onset   Breast cancer Mother 63   Stroke Mother    Cancer Mother    Hypertension Father    Heart attack Father    Arthritis Father    Heart disease Father    Diabetes Sister    Breast cancer Paternal Aunt    Breast cancer Paternal Grandmother        dx 65s   Rheum arthritis Daughter    Diabetes Sister    Hearing loss Sister     Health Maintenance  Topic Date Due   Meningococcal B Vaccine (1 of 4 - Increased Risk) Never done   Hepatitis B Vaccines (1 of 3 - Risk 3-dose series) Never done   Pneumococcal Vaccine: 50+ Years (4 of 4 - PCV20 or PCV21) 01/15/2019   DTaP/Tdap/Td (4 - Td or Tdap) 03/29/2021   COVID-19 Vaccine (4 - 2024-25 season) 08/18/2023   Medicare Annual Wellness (AWV)  11/15/2023   INFLUENZA VACCINE  07/17/2024   MAMMOGRAM  08/06/2025    Colonoscopy  10/07/2028   DEXA SCAN  Completed   Hepatitis C Screening  Completed   Zoster Vaccines- Shingrix  Completed   HPV VACCINES  Aged Out     ----------------------------------------------------------------------------------------------------------------------------------------------------------------------------------------------------------------- Physical Exam BP (!) 96/58 (BP Location: Left Arm, Patient Position: Sitting, Cuff Size: Small)   Pulse 77   Ht 5' 9.5 (1.765 m)  Wt 149 lb (67.6 kg)   SpO2 99%   BMI 21.69 kg/m   Physical Exam Constitutional:      Appearance: Normal appearance.  HENT:     Head: Normocephalic and atraumatic.  Eyes:     General: No scleral icterus. Musculoskeletal:     Comments: TTP along the upper humerus and acromion.  Tightness along upper trapezius and cervical paraspinals on the R.   Neurological:     Mental Status: She is alert.  Psychiatric:        Mood and Affect: Mood normal.        Behavior: Behavior normal.     ------------------------------------------------------------------------------------------------------------------------------------------------------------------------------------------------------------------- Assessment and Plan  Acute pain of right shoulder Xrays of R shoulder ordered.  Continue icing of shoulder.   Neck pain on right side Xrays of cervical spine ordered.    No orders of the defined types were placed in this encounter.   No follow-ups on file.

## 2024-06-22 ENCOUNTER — Other Ambulatory Visit (HOSPITAL_COMMUNITY): Payer: Self-pay

## 2024-06-22 DIAGNOSIS — H02834 Dermatochalasis of left upper eyelid: Secondary | ICD-10-CM | POA: Diagnosis not present

## 2024-06-22 DIAGNOSIS — H02831 Dermatochalasis of right upper eyelid: Secondary | ICD-10-CM | POA: Diagnosis not present

## 2024-06-22 DIAGNOSIS — H57813 Brow ptosis, bilateral: Secondary | ICD-10-CM | POA: Diagnosis not present

## 2024-06-22 DIAGNOSIS — H02423 Myogenic ptosis of bilateral eyelids: Secondary | ICD-10-CM | POA: Diagnosis not present

## 2024-06-23 DIAGNOSIS — K080 Exfoliation of teeth due to systemic causes: Secondary | ICD-10-CM | POA: Diagnosis not present

## 2024-07-01 ENCOUNTER — Other Ambulatory Visit (HOSPITAL_COMMUNITY): Payer: Self-pay | Admitting: Physician Assistant

## 2024-07-01 ENCOUNTER — Other Ambulatory Visit (HOSPITAL_COMMUNITY): Payer: Self-pay

## 2024-07-01 MED ORDER — METOPROLOL SUCCINATE ER 25 MG PO TB24
12.5000 mg | ORAL_TABLET | Freq: Every day | ORAL | 1 refills | Status: DC
  Filled 2024-07-01 – 2024-07-03 (×2): qty 45, 90d supply, fill #0
  Filled 2024-09-21: qty 45, 90d supply, fill #1

## 2024-07-03 ENCOUNTER — Other Ambulatory Visit (HOSPITAL_COMMUNITY): Payer: Self-pay

## 2024-07-07 ENCOUNTER — Other Ambulatory Visit: Payer: Self-pay | Admitting: Surgery

## 2024-07-07 DIAGNOSIS — Z1231 Encounter for screening mammogram for malignant neoplasm of breast: Secondary | ICD-10-CM

## 2024-07-22 ENCOUNTER — Ambulatory Visit: Attending: Cardiology

## 2024-07-22 DIAGNOSIS — Z01810 Encounter for preprocedural cardiovascular examination: Secondary | ICD-10-CM | POA: Diagnosis not present

## 2024-07-22 NOTE — Progress Notes (Signed)
 Virtual Visit via Telephone Note   Because of Donna Ramsey co-morbid illnesses, she is at least at moderate risk for complications without adequate follow up.  This format is felt to be most appropriate for this patient at this time.  Due to technical limitations with video connection (technology), today's appointment will be conducted as an audio only telehealth visit, and Donna Ramsey verbally agreed to proceed in this manner.   All issues noted in this document were discussed and addressed.  No physical exam could be performed with this format.  Evaluation Performed:  Preoperative cardiovascular risk assessment _____________   Date:  07/22/2024   Patient ID:  Donna Ramsey, DOB [birth year], MRN [id number] Patient Location:  Home Provider location:   Office  Primary Care Provider:  Alvia Bring, DO Primary Cardiologist:  None  Chief Complaint / Patient Profile   73 y.o. y/o female with a h/o portal hypertension, OSA, atrial fibrillation who is pending functional ptosis repair, cosmetic upper and lower blepharoplasty and presents today for telephonic preoperative cardiovascular risk assessment.  History of Present Illness    Donna Ramsey is a 73 y.o. female who presents via audio/video conferencing for a telehealth visit today.  Pt was last seen in cardiology clinic on 06/11/2024 by Daril Kicks, PA-C.  At that time FAVOUR ALESHIRE was doing well.  The patient is now pending procedure as outlined above. Since her last visit, she has been doing well.   No episodes of Afib, only had two. Feb 2024. Then January this year. Apple watch, gets a report every Monday. BP has been fine, no issues. She takes metoprolol  in the morning so that is why BP has been low at times but no symptoms. She walks 3-4 miles every day. She goes to the gym and yoga.   Per office protocol, patient can hold Eliquis  for 2 days prior to procedure.  Please resume when medically safe to do so. Patient  will not need bridging with Lovenox (enoxaparin) around procedure.  Past Medical History    Past Medical History:  Diagnosis Date   A-fib [facility])    Allergy    Arthritis    Asplenia    Asthma    as a child   Family history of breast cancer    GERD (gastroesophageal reflux disease)    Hypertension    Liver disease, unspecified    heterogenous echotexture   Pneumonia    PONV (postoperative nausea and vomiting)    Portal hypertension (HCC)    Sleep apnea    mild  no cpap   Splenic infarct    Past Surgical History:  Procedure Laterality Date   ABDOMINAL HYSTERECTOMY     BREAST BIOPSY Bilateral 50 yrs ago   benign   CHONDROPLASTY Left 07/28/2020   Procedure: CHONDROPLASTY LEFT KNEE;  Surgeon: Cristy Bonner DASEN, MD;  Location: [facility];  Service: Orthopedics;  Laterality: Left;   HERNIA REPAIR     INGUINAL HERNIA REPAIR Right 05/10/2021   Procedure: RIGHT INGUINAL HERNIA REPAIR WITH MESH;  Surgeon: Vernetta Berg, MD;  Location: [facility];  Service: General;  Laterality: Right;   KNEE ARTHROSCOPY WITH LATERAL MENISECTOMY Left 07/28/2020   Procedure: LEFT KNEE ARTHROSCOPY WITH LATERAL MENISECTOMY;  Surgeon: Cristy Bonner DASEN, MD;  Location: [facility];  Service: Orthopedics;  Laterality: Left;   KNEE ARTHROSCOPY WITH MEDIAL MENISECTOMY Left 07/28/2020   Procedure: LEFT KNEE ARTHROSCOPY WITH MEDIAL MENISECTOMY;  Surgeon: Cristy Bonner DASEN,  MD;  Location: [facility];  Service: Orthopedics;  Laterality: Left;   SPLENECTOMY, TOTAL     TOTAL KNEE ARTHROPLASTY Left 05/27/2023   Procedure: TOTAL KNEE ARTHROPLASTY;  Surgeon: Edna Toribio LABOR, MD;  Location: [facility];  Service: Orthopedics;  Laterality: Left;   TUBAL LIGATION  04/1981    Allergies  Allergies  Allergen Reactions   Clindamycin Hives   Codeine Rash   Pentazocine Hives   Sulfamethoxazole-Trimethoprim Itching and Rash     2017 Reaction: rash, itching 03/16/2021: faint rash and itching  30 min after bactrim 400/80 oral challenge.    Doxycycline Other (See Comments)    Itching without rash near end of course    Shellfish Allergy Other (See Comments)    Only scallops   Levofloxacin Rash and Other (See Comments)    tendonitis    Neomycin Itching   Sulfa Antibiotics Rash    Home Medications    Prior to Admission medications   Medication Sig Start Date End Date Taking? Authorizing Provider  Acetylcysteine (NAC) 500 MG CAPS Take 500 mg by mouth 2 (two) times daily.    [provider]  ALPHA LIPOIC ACID PO Take by mouth.    [provider]  amoxicillin  (AMOXIL ) 500 MG capsule Take 4 capsules (2,000 mg total) by mouth 1 hour prior to dental work. 04/13/24     apixaban  (ELIQUIS ) 5 MG TABS tablet TAKE 1 TABLET TWICE A DAY 04/06/24   Cindie Ole DASEN, MD  Ascorbic Acid (VITAMIN C) 500 MG CAPS Take 500 mg by mouth daily. liposoma    [provider]  b complex vitamins capsule Take 1 capsule by mouth daily.    [provider]  betamethasone  dipropionate 0.05 % cream Apply liberally to affected skin 2 (two) times daily. 06/09/24     Cholecalciferol (VITAMIN D3) 125 MCG (5000 UT) CAPS Take 5,000 Units by mouth daily.    [provider]  clobetasol  cream (TEMOVATE ) 0.05 % Apply 1 Application topically 2 (two) times daily. 06/09/24   Alvia Bring, DO  co-enzyme Q-10 30 MG capsule Take 100 mg by mouth daily.    [provider]  Collagen-Vitamin C-Biotin (COLLAGEN PO) 1 scoop daily- without Vitamin C Patient not taking: Reported on 06/16/2024    [provider]  diltiazem  (CARDIZEM ) 30 MG tablet Take 1 tablet every 4 hours AS NEEDED for AFIB heart rate >100 as long as top BP >100. 02/06/23   Dow Arland BROCKS, NP  GLYCINE PO Take 3,000 mg by mouth at bedtime.    [provider]  lidocaine  4 % Place 1 patch onto the skin daily as needed (pain).    [provider]  MAGNESIUM PO Take 280 mg by mouth at bedtime.     [provider]  metoprolol  succinate (TOPROL  XL) 25 MG 24 hr tablet Take 0.5 tablets (12.5 mg total) by mouth at bedtime. 07/01/24   Fenton, Clint R, PA  milk thistle 175 MG tablet Take 380 mg by mouth 2 (two) times daily.    [provider]  Omega-3 Fatty Acids (FISH OIL PO) Take 640 mg by mouth 2 (two) times daily.    [provider]  OnabotulinumtoxinA (BOTOX IJ) In her eyebrow- every 4-6 months    [provider]  OVER THE COUNTER MEDICATION Take 30 mg by mouth 2 (two) times daily. Bio-Quercetin supplement    [provider]  oxymetazoline (AFRIN) 0.05 % nasal spray Place 1 spray into both  nostrils at bedtime as needed for congestion.    [provider]  Oxymetazoline HCl (UPNEEQ) 0.1 % SOLN INSTILL 1 DROP INTO AFFECTED EYE(S) BY OPHTHALMIC ROUTE ONCE DAILY 05/07/24   [provider]  tretinoin (RETIN-A) 0.05 % cream Apply topically at bedtime. 04/02/24   [provider]  triamcinolone  cream (KENALOG ) 0.1 % Apply topically once a day as needed (eczema). 11/06/22   Alvia Bring, DO  triamcinolone  cream (KENALOG ) 0.1 % Apply to affected area once daily as needed for excema. Patient not taking: Reported on 06/16/2024 11/06/22   Alvia Bring, DO  Vitamin D -Vitamin K (VITAMIN K2-VITAMIN D3 PO) Take 1 tablet by mouth daily. Vitamin d  4000 units Vitamin K2 100 mg    [provider]  Zinc Sulfate (ZINC 15 PO) Take 15 mg by mouth daily.    [provider]    Physical Exam    Vital Signs:  Ebelin F Kuba does not have vital signs available for review today.  Given telephonic nature of communication, physical exam is limited. AAOx3. NAD. Normal affect.  Speech and respirations are unlabored.  Accessory Clinical Findings    None  Assessment & Plan    1.  Preoperative Cardiovascular Risk Assessment:  Ms. Kawa perioperative risk of a major cardiac event is 0.4% according to the Revised Cardiac Risk  Index (RCRI).  Therefore, she is at low risk for perioperative complications.   Her functional capacity is good at 6.36 METs according to the Duke Activity Status Index (DASI). Recommendations: According to ACC/AHA guidelines, no further cardiovascular testing needed.  The patient may proceed to surgery at acceptable risk.   Antiplatelet and/or Anticoagulation Recommendations:  Eliquis  (Apixaban ) can be held for 2 days prior to surgery.  Please resume post op when felt to be safe.     The patient was advised that if she develops new symptoms prior to surgery to contact our office to arrange for a follow-up visit, and she verbalized understanding.   A copy of this note will be routed to requesting surgeon.  Time:   Today, I have spent 7 minutes with the patient with telehealth technology discussing medical history, symptoms, and management plan.     Orren LOISE Fabry, PA-C  07/22/2024, 7:51 AM

## 2024-07-31 ENCOUNTER — Encounter: Payer: Self-pay | Admitting: Sports Medicine

## 2024-07-31 ENCOUNTER — Ambulatory Visit (INDEPENDENT_AMBULATORY_CARE_PROVIDER_SITE_OTHER)

## 2024-07-31 ENCOUNTER — Ambulatory Visit (INDEPENDENT_AMBULATORY_CARE_PROVIDER_SITE_OTHER): Admitting: Sports Medicine

## 2024-07-31 DIAGNOSIS — M7989 Other specified soft tissue disorders: Secondary | ICD-10-CM

## 2024-07-31 DIAGNOSIS — G4733 Obstructive sleep apnea (adult) (pediatric): Secondary | ICD-10-CM | POA: Diagnosis not present

## 2024-07-31 DIAGNOSIS — M79675 Pain in left toe(s): Secondary | ICD-10-CM

## 2024-07-31 DIAGNOSIS — W208XXA Other cause of strike by thrown, projected or falling object, initial encounter: Secondary | ICD-10-CM | POA: Diagnosis not present

## 2024-07-31 NOTE — Progress Notes (Signed)
    Procedures performed today:    None.  Independent interpretation of notes and tests performed by another provider:   None.  Brief History, Exam, Impression, and Recommendations:    Pain and swelling of toe, left second Pleasant 73 year old female, she dropped her cell phone on her left second toe, she had immediate pain, swelling, bruising. She did not present to us , she did some walking, now 2 weeks later she has presented with persistence of pain and swelling. On exam she has visibly swollen and bruised second toe. Tenderness is discretely at the second proximal phalanx. We buddy tape her 2nd and 3rd toes together, I have encouraged rigid soled shoes. We will get an x-ray, she declines any pain medication. I like to see her back in a month.  OSA (obstructive sleep apnea) Mild obstructive sleep apnea per patient report, she does a lot of gasping and snoring. She is interested in an ENT referral for discussion of potential surgical approaches.    ____________________________________________ Debby PARAS. Curtis, M.D., ABFM., CAQSM., AME. [facility]  Adjunct Professor of [facility]  [employer]  Restaurant manager, fast food

## 2024-07-31 NOTE — Assessment & Plan Note (Signed)
 Mild obstructive sleep apnea per patient report, she does a lot of gasping and snoring. She is interested in an ENT referral for discussion of potential surgical approaches.

## 2024-07-31 NOTE — Assessment & Plan Note (Signed)
 Pleasant 73 year old female, she dropped her cell phone on her left second toe, she had immediate pain, swelling, bruising. She did not present to us , she did some walking, now 2 weeks later she has presented with persistence of pain and swelling. On exam she has visibly swollen and bruised second toe. Tenderness is discretely at the second proximal phalanx. We buddy tape her 2nd and 3rd toes together, I have encouraged rigid soled shoes. We will get an x-ray, she declines any pain medication. I like to see her back in a month.

## 2024-08-03 ENCOUNTER — Encounter: Payer: Self-pay | Admitting: Sports Medicine

## 2024-08-04 ENCOUNTER — Other Ambulatory Visit (HOSPITAL_COMMUNITY): Payer: Self-pay

## 2024-08-04 DIAGNOSIS — H02423 Myogenic ptosis of bilateral eyelids: Secondary | ICD-10-CM | POA: Diagnosis not present

## 2024-08-04 MED ORDER — BACITRACIN-POLYMYXIN B 500-10000 UNIT/GM OP OINT
1.0000 | TOPICAL_OINTMENT | Freq: Three times a day (TID) | OPHTHALMIC | 3 refills | Status: AC
  Filled 2024-08-04: qty 3.5, 4d supply, fill #0
  Filled 2024-08-06: qty 3.5, 10d supply, fill #0
  Filled 2024-10-09: qty 3.5, 10d supply, fill #1

## 2024-08-06 ENCOUNTER — Other Ambulatory Visit (HOSPITAL_COMMUNITY): Payer: Self-pay

## 2024-08-18 ENCOUNTER — Encounter: Payer: Self-pay | Admitting: Sports Medicine

## 2024-08-20 ENCOUNTER — Other Ambulatory Visit (HOSPITAL_COMMUNITY): Payer: Self-pay

## 2024-08-20 DIAGNOSIS — H35373 Puckering of macula, bilateral: Secondary | ICD-10-CM | POA: Diagnosis not present

## 2024-08-20 DIAGNOSIS — H2513 Age-related nuclear cataract, bilateral: Secondary | ICD-10-CM | POA: Diagnosis not present

## 2024-08-20 DIAGNOSIS — H35342 Macular cyst, hole, or pseudohole, left eye: Secondary | ICD-10-CM | POA: Diagnosis not present

## 2024-08-20 DIAGNOSIS — H43813 Vitreous degeneration, bilateral: Secondary | ICD-10-CM | POA: Diagnosis not present

## 2024-08-20 MED ORDER — TRETINOIN 0.1 % EX CREA
1.0000 | TOPICAL_CREAM | Freq: Every day | CUTANEOUS | 2 refills | Status: AC
  Filled 2024-08-20: qty 45, 30d supply, fill #0

## 2024-08-21 ENCOUNTER — Other Ambulatory Visit (HOSPITAL_COMMUNITY): Payer: Self-pay

## 2024-08-25 ENCOUNTER — Ambulatory Visit
Admission: RE | Admit: 2024-08-25 | Discharge: 2024-08-25 | Disposition: A | Discharge status: Home / Self Care | Source: Ambulatory Visit | Attending: Surgery | Admitting: Surgery

## 2024-08-25 DIAGNOSIS — Z1231 Encounter for screening mammogram for malignant neoplasm of breast: Secondary | ICD-10-CM

## 2024-08-26 ENCOUNTER — Ambulatory Visit (INDEPENDENT_AMBULATORY_CARE_PROVIDER_SITE_OTHER): Admitting: Family Medicine

## 2024-08-26 ENCOUNTER — Encounter: Payer: Self-pay | Admitting: Family Medicine

## 2024-08-26 VITALS — BP 119/76 | HR 63 | Temp 97.7°F | Ht 69.5 in | Wt 155.0 lb

## 2024-08-26 DIAGNOSIS — H6992 Unspecified Eustachian tube disorder, left ear: Secondary | ICD-10-CM | POA: Diagnosis not present

## 2024-08-26 DIAGNOSIS — M7918 Myalgia, other site: Secondary | ICD-10-CM | POA: Diagnosis not present

## 2024-08-26 NOTE — Progress Notes (Signed)
 Donna Ramsey - 73 y.o. female MRN [id number]  Date of birth: 1951/01/03  Subjective Chief Complaint  Patient presents with   Ear Pain   Hip Pain    HPI Donna Ramsey is a 73 y.o. female here today for follow up visit.    She has complaint of  L ear pressure and muffled sound.  This started a few days ago.  She has tried saline rinses and cleaning the ear.  She denies pain associated with this.  She is using Afrin chronically for congestion.  She does have appt with ENT as well to address snoring and congestion.     She is also having L buttock and hip pain.  This started a couple of weeks ago.  Gait has changed some due to broken toe.  She denies radiation of pain, numbness or tingling.   ROS:  A comprehensive ROS was completed and negative except as noted per HPI  Allergies  Allergen Reactions   Clindamycin Hives   Codeine Rash and Dermatitis   Pentazocine Hives   Sulfamethoxazole-Trimethoprim Itching and Rash     2017 Reaction: rash, itching 03/16/2021: faint rash and itching 30 min after bactrim 400/80 oral challenge.    Ciprofloxacin Dermatitis   Doxycycline Other (See Comments) and Itching    Itching without rash near end of course   Shellfish Allergy Other (See Comments)    Only scallops  shellfish derived   Levofloxacin Other (See Comments) and Rash    tendonitis  levofloxacin   Neomycin Itching   Sulfa Antibiotics Rash and Dermatitis    Past Medical History:  Diagnosis Date   A-fib (HCC)    Allergy    Arthritis    Asplenia    Asthma    as a child   Family history of breast cancer    GERD (gastroesophageal reflux disease)    Hypertension    Liver disease, unspecified    heterogenous echotexture   Pneumonia    PONV (postoperative nausea and vomiting)    Portal hypertension (HCC)    Sleep apnea    mild  no cpap   Splenic infarct     Past Surgical History:  Procedure Laterality Date   ABDOMINAL HYSTERECTOMY     BREAST BIOPSY Bilateral 50 yrs  ago   benign   CHONDROPLASTY Left 07/28/2020   Procedure: CHONDROPLASTY LEFT KNEE;  Surgeon: Cristy Bonner DASEN, MD;  Location: [facility];  Service: Orthopedics;  Laterality: Left;   HERNIA REPAIR     INGUINAL HERNIA REPAIR Right 05/10/2021   Procedure: RIGHT INGUINAL HERNIA REPAIR WITH MESH;  Surgeon: Vernetta Berg, MD;  Location: [facility];  Service: General;  Laterality: Right;   KNEE ARTHROSCOPY WITH LATERAL MENISECTOMY Left 07/28/2020   Procedure: LEFT KNEE ARTHROSCOPY WITH LATERAL MENISECTOMY;  Surgeon: Cristy Bonner DASEN, MD;  Location: [facility];  Service: Orthopedics;  Laterality: Left;   KNEE ARTHROSCOPY WITH MEDIAL MENISECTOMY Left 07/28/2020   Procedure: LEFT KNEE ARTHROSCOPY WITH MEDIAL MENISECTOMY;  Surgeon: Cristy Bonner DASEN, MD;  Location: [facility];  Service: Orthopedics;  Laterality: Left;   SPLENECTOMY, TOTAL     TOTAL KNEE ARTHROPLASTY Left 05/27/2023   Procedure: TOTAL KNEE ARTHROPLASTY;  Surgeon: Edna Toribio LABOR, MD;  Location: [facility];  Service: Orthopedics;  Laterality: Left;   TUBAL LIGATION  04/1981    Social History   Socioeconomic History   Marital status: Widowed    Spouse name: Not on file  Number of children: 3   Years of education: 16   Highest education level: Bachelor's degree (e.g., BA, AB, BS)  Occupational History    Comment: Retired Teacher, early years/pre  Tobacco Use   Smoking status: Never   Smokeless tobacco: Never   Tobacco comments:    Never smoked 01/03/24  Vaping Use   Vaping status: Never Used  Substance and Sexual Activity   Alcohol  use: Yes    Alcohol /week: 7.0 standard drinks of alcohol     Types: 7 Glasses of wine per week    Comment: 1 glass of wine nightly 01/03/24   Drug use: Never   Sexual activity: Never  Other Topics Concern   Not on file  Social History Narrative   Lives alone but next to her daughter. She is walking 3-5 miles per day. She likes to read, cook and do yard work.   Social  Drivers of Corporate investment banker Strain: Low Risk  (06/18/2024)   Overall Financial Resource Strain (CARDIA)    Difficulty of Paying Living Expenses: Not hard at all  Food Insecurity: No Food Insecurity (06/18/2024)   Hunger Vital Sign    Worried About Running Out of Food in the Last Year: Never true    Ran Out of Food in the Last Year: Never true  Transportation Needs: No Transportation Needs (06/18/2024)   PRAPARE - Administrator, Civil Service (Medical): No    Lack of Transportation (Non-Medical): No  Physical Activity: Sufficiently Active (06/18/2024)   Exercise Vital Sign    Days of Exercise per Week: 5 days    Minutes of Exercise per Session: 90 min  Stress: No Stress Concern Present (06/18/2024)   [employer] of Occupational Health - Occupational Stress Questionnaire    Feeling of Stress: Only a little  Social Connections: Moderately Integrated (06/18/2024)   Social Connection and Isolation Panel    Frequency of Communication with Friends and Family: Three times a week    Frequency of Social Gatherings with Friends and Family: Three times a week    Attends Religious Services: More than 4 times per year    Active Member of Clubs or Organizations: Yes    Attends Banker Meetings: More than 4 times per year    Marital Status: Widowed    Family History  Problem Relation Age of Onset   Breast cancer Mother 80   Stroke Mother    Cancer Mother    Hypertension Father    Heart attack Father    Arthritis Father    Heart disease Father    Diabetes Sister    Breast cancer Paternal Aunt    Breast cancer Paternal Grandmother        dx 62s   Rheum arthritis Daughter    Diabetes Sister    Hearing loss Sister     Health Maintenance  Topic Date Due   Medicare Annual Wellness (AWV)  11/15/2023   COVID-19 Vaccine (4 - 2025-26 season) 09/11/2024 (Originally 08/17/2024)   Influenza Vaccine  03/16/2025 (Originally 07/17/2024)   DTaP/Tdap/Td (4 - Td or Tdap)  08/26/2025 (Originally 03/29/2021)   Pneumococcal Vaccine: 50+ Years (4 of 4 - PCV20 or PCV21) 08/26/2025 (Originally 01/15/2019)   Meningococcal B Vaccine (1 of 4 - Increased Risk) 08/26/2025 (Originally 09/15/1963)   MAMMOGRAM  08/06/2025   Colonoscopy  10/07/2028   DEXA SCAN  Completed   Hepatitis C Screening  Completed   Zoster Vaccines- Shingrix  Completed   HPV VACCINES  Aged Out     ----------------------------------------------------------------------------------------------------------------------------------------------------------------------------------------------------------------- Physical Exam BP 119/76 (BP Location: Left Arm, Patient Position: Sitting, Cuff Size: Normal)   Pulse 63   Temp 97.7 F (36.5 C) (Oral)   Ht 5' 9.5 (1.765 m)   Wt 155 lb (70.3 kg)   SpO2 99%   BMI 22.56 kg/m   Physical Exam Constitutional:      Appearance: Normal appearance.  HENT:     Head: Normocephalic and atraumatic.     Ears:     Comments: L serous effusion.     Nose:     Comments: Boggy turbinates.  Cardiovascular:     Rate and Rhythm: Normal rate and regular rhythm.  Pulmonary:     Effort: Pulmonary effort is normal.     Breath sounds: Normal breath sounds.  Musculoskeletal:     Comments: Pain in the mid to lower buttock area.  Neurological:     Mental Status: She is alert.     ------------------------------------------------------------------------------------------------------------------------------------------------------------------------------------------------------------------- Assessment and Plan  Eustachian tube disorder, left Recommend starting flonase daily.  Avoid/discontinue use of Afrin.  Discussed that her nasal congestion may worsen before getting better due to rebound symptoms.   Piriformis muscle pain Given handout for home exercises.    No orders of the defined types were placed in this encounter.   No follow-ups on file.

## 2024-08-26 NOTE — Assessment & Plan Note (Signed)
Given handout for home exercises.

## 2024-08-26 NOTE — Assessment & Plan Note (Signed)
 Recommend starting flonase daily.  Avoid/discontinue use of Afrin.  Discussed that her nasal congestion may worsen before getting better due to rebound symptoms.

## 2024-08-26 NOTE — Patient Instructions (Signed)
 Start flonase daily

## 2024-08-31 ENCOUNTER — Ambulatory Visit: Admitting: Sports Medicine

## 2024-08-31 ENCOUNTER — Encounter: Payer: Self-pay | Admitting: Sports Medicine

## 2024-08-31 ENCOUNTER — Ambulatory Visit (INDEPENDENT_AMBULATORY_CARE_PROVIDER_SITE_OTHER): Admitting: Sports Medicine

## 2024-08-31 VITALS — BP 108/78 | Ht 69.5 in | Wt 155.0 lb

## 2024-08-31 DIAGNOSIS — S92515D Nondisplaced fracture of proximal phalanx of left lesser toe(s), subsequent encounter for fracture with routine healing: Secondary | ICD-10-CM

## 2024-08-31 NOTE — Progress Notes (Signed)
   Subjective:    Patient ID: Imogene JULIANNA Sayers, female    DOB: 21-Jan-1951, 73 y.o.   MRN: [id number]  HPI  Katheryn is here for follow-up on her toe fracture.  She is 6 weeks out from a nondisplaced fracture to the proximal phalanx of her left second toe.  She still has some mild discomfort but it is improving.  She is very active enjoys walking several miles per week.  Still getting some mild swelling as well.   Review of Systems As above    Objective:   Physical Exam  Well-developed, fit appearing.  No acute distress  Left second toe: Mild diffuse swelling.  Slight tenderness to palpation at the proximal phalanx.  No clinical angulation or malrotation.  Brisk capillary refill.  Normal color.      Assessment & Plan:   6-week status post nondisplaced proximal phalanx fracture left second toe  Patient may continue to be active using pain as her guide.  Continue with buddy tape and stiff soled shoes as needed for comfort.  Follow-up as needed.  This note was dictated using Dragon naturally speaking software and may contain errors in syntax, spelling, or content which have not been identified prior to signing this note.

## 2024-09-04 ENCOUNTER — Other Ambulatory Visit (HOSPITAL_COMMUNITY): Payer: Self-pay

## 2024-09-04 DIAGNOSIS — N6011 Diffuse cystic mastopathy of right breast: Secondary | ICD-10-CM | POA: Diagnosis not present

## 2024-09-04 DIAGNOSIS — N6012 Diffuse cystic mastopathy of left breast: Secondary | ICD-10-CM | POA: Diagnosis not present

## 2024-09-04 DIAGNOSIS — Z9189 Other specified personal risk factors, not elsewhere classified: Secondary | ICD-10-CM | POA: Diagnosis not present

## 2024-09-04 MED ORDER — FLUZONE HIGH-DOSE 0.5 ML IM SUSY
0.5000 mL | PREFILLED_SYRINGE | Freq: Once | INTRAMUSCULAR | 0 refills | Status: AC
  Filled 2024-09-04: qty 0.5, 1d supply, fill #0

## 2024-09-05 ENCOUNTER — Other Ambulatory Visit: Payer: Self-pay | Admitting: Medical Genetics

## 2024-09-08 ENCOUNTER — Other Ambulatory Visit (HOSPITAL_COMMUNITY): Payer: Self-pay

## 2024-09-09 ENCOUNTER — Other Ambulatory Visit: Payer: Self-pay | Admitting: Surgery

## 2024-09-09 DIAGNOSIS — Z9189 Other specified personal risk factors, not elsewhere classified: Secondary | ICD-10-CM

## 2024-09-17 ENCOUNTER — Other Ambulatory Visit (HOSPITAL_COMMUNITY): Payer: Self-pay

## 2024-09-17 ENCOUNTER — Other Ambulatory Visit: Payer: Self-pay | Admitting: Cardiology

## 2024-09-17 NOTE — Telephone Encounter (Signed)
 Prescription refill request for Eliquis  received. Indication:afib Last office visit:6/25 Scr:0.58  6/25 Age: 73 Weight:70.3  kg  Prescription refilled

## 2024-09-18 ENCOUNTER — Other Ambulatory Visit (HOSPITAL_COMMUNITY): Payer: Self-pay

## 2024-09-18 DIAGNOSIS — L988 Other specified disorders of the skin and subcutaneous tissue: Secondary | ICD-10-CM | POA: Insufficient documentation

## 2024-09-18 DIAGNOSIS — H01003 Unspecified blepharitis right eye, unspecified eyelid: Secondary | ICD-10-CM | POA: Diagnosis not present

## 2024-09-18 DIAGNOSIS — H01006 Unspecified blepharitis left eye, unspecified eyelid: Secondary | ICD-10-CM | POA: Diagnosis not present

## 2024-09-18 DIAGNOSIS — H0230 Blepharochalasis unspecified eye, unspecified eyelid: Secondary | ICD-10-CM | POA: Insufficient documentation

## 2024-09-18 MED ORDER — PREDNISOLONE ACETATE 1 % OP SUSP
1.0000 [drp] | Freq: Four times a day (QID) | OPHTHALMIC | 1 refills | Status: DC
  Filled 2024-09-18: qty 15, 75d supply, fill #0

## 2024-09-22 ENCOUNTER — Other Ambulatory Visit (HOSPITAL_COMMUNITY): Payer: Self-pay

## 2024-10-05 ENCOUNTER — Ambulatory Visit (INDEPENDENT_AMBULATORY_CARE_PROVIDER_SITE_OTHER): Admitting: Otolaryngology

## 2024-10-05 ENCOUNTER — Encounter (INDEPENDENT_AMBULATORY_CARE_PROVIDER_SITE_OTHER): Payer: Self-pay | Admitting: Otolaryngology

## 2024-10-05 ENCOUNTER — Other Ambulatory Visit (HOSPITAL_COMMUNITY): Payer: Self-pay

## 2024-10-05 VITALS — BP 112/71 | HR 79 | Ht 69.5 in | Wt 150.0 lb

## 2024-10-05 DIAGNOSIS — J3489 Other specified disorders of nose and nasal sinuses: Secondary | ICD-10-CM

## 2024-10-05 DIAGNOSIS — J343 Hypertrophy of nasal turbinates: Secondary | ICD-10-CM | POA: Diagnosis not present

## 2024-10-05 DIAGNOSIS — H938X2 Other specified disorders of left ear: Secondary | ICD-10-CM

## 2024-10-05 DIAGNOSIS — R0981 Nasal congestion: Secondary | ICD-10-CM | POA: Diagnosis not present

## 2024-10-05 DIAGNOSIS — J342 Deviated nasal septum: Secondary | ICD-10-CM

## 2024-10-05 DIAGNOSIS — G4733 Obstructive sleep apnea (adult) (pediatric): Secondary | ICD-10-CM

## 2024-10-05 MED ORDER — PREDNISONE 20 MG PO TABS
20.0000 mg | ORAL_TABLET | Freq: Every day | ORAL | 0 refills | Status: AC
  Filled 2024-10-05: qty 5, 5d supply, fill #0

## 2024-10-05 NOTE — Patient Instructions (Addendum)
 Use prednisone  20mg  tablet once daily for 5 days Pop your ears multiple times per day Can consider using neti pot or sinus rinses daily We've referred you to sleep medicine

## 2024-10-05 NOTE — Progress Notes (Signed)
 Dear Dr. Curtis, Here is my assessment for our mutual patient, [name]. Thank you for allowing me the opportunity to care for your patient. Please do not hesitate to contact me should you have any other questions. Sincerely, Dr. Eldora Blanch  Otolaryngology Clinic Note Referring provider: Dr. Curtis HPI:  Donna Ramsey is a 73 y.o. female kindly referred by Dr. Curtis for evaluation of sleep apnea  Initial visit (09/2024):  Diagnosed with OSA in 2024, after she had an episode of A-fib, lasted about 3 days. She did a sleep study after that. She reports she does wake up a few times per night, but does not report that she snore. Other issue is that she feels like she has a fair amount of nasal congestion from the nose when she lays down. Can be either side, but does seem to affect her quite a bit. No frequent history of sinus infection, facial pressure/discomfort, discolored drainage, hyposmia. She has tried afrin PRN (helps a lot). Tried breathe right strips does not help much. Using CPAP: yes, but feels like swallowing a lot of air with it. Tolerates it fairly well.But feels like she needs a different mask Denies dysphonia, dysphagia Does not use any nasal sprays typically  In addition, she reports that that she seems to have left sided muffled hearing. Ongoing for about 3-4 weeks, denies antecedent URI. No recent allergies. Under water . Denies occupational noise exposure or recreational. Having some congestion today. No recent hearing test. Did try flonase for about 10 days.  Patient denies: ear pain, vertigo, drainage, tinnitus Patient also denies barotrauma, vestibular suppressant use, ototoxic medication use Prior ear surgery: no  H&N Surgery: Tonsillectomy  Personal or FHx of bleeding dz or anesthesia difficulty: no  GLP-1: no AP/AC: Eliquis   Tobacco: no  PMHx: A-fib, OSA, HTN, Liver disease w/portal HTN, Neutropenia, MGUS, GERD  Independent Review of  Additional Tests or Records:  Dr. Curtis (07/31/2024): noted OSA, mild per report; lot of gasping; Dx: OSA; Rx: ref to ENT Sleep study 04/15/2023 independently interpreted: noted AHI 13.6, CA index 0/hr,  no PLM, Min O2 86% CBC and CMP 06/09/2024: Na 131, BUN/Cr 14/0.58; WBC 4.5; Eos (prior) 0  PMH/Meds/All/SocHx/FamHx/ROS:   Past Medical History:  Diagnosis Date   A-fib (HCC)    Allergy    Arthritis    Asplenia    Asthma    as a child   Family history of breast cancer    GERD (gastroesophageal reflux disease)    Hypertension    Liver disease, unspecified    heterogenous echotexture   Pneumonia    PONV (postoperative nausea and vomiting)    Portal hypertension (HCC)    Sleep apnea    mild  no cpap   Splenic infarct      Past Surgical History:  Procedure Laterality Date   ABDOMINAL HYSTERECTOMY     BREAST BIOPSY Bilateral 50 yrs ago   benign   CHONDROPLASTY Left 07/28/2020   Procedure: CHONDROPLASTY LEFT KNEE;  Surgeon: Cristy Bonner DASEN, MD;  Location: [facility];  Service: Orthopedics;  Laterality: Left;   HERNIA REPAIR     INGUINAL HERNIA REPAIR Right 05/10/2021   Procedure: RIGHT INGUINAL HERNIA REPAIR WITH MESH;  Surgeon: Vernetta Berg, MD;  Location: [facility];  Service: General;  Laterality: Right;   KNEE ARTHROSCOPY WITH LATERAL MENISECTOMY Left 07/28/2020   Procedure: LEFT KNEE ARTHROSCOPY WITH LATERAL MENISECTOMY;  Surgeon: Cristy Bonner DASEN, MD;  Location: [facility];  Service: Orthopedics;  Laterality: Left;   KNEE ARTHROSCOPY WITH MEDIAL MENISECTOMY Left 07/28/2020   Procedure: LEFT KNEE ARTHROSCOPY WITH MEDIAL MENISECTOMY;  Surgeon: Cristy Bonner DASEN, MD;  Location: [facility];  Service: Orthopedics;  Laterality: Left;   SPLENECTOMY, TOTAL     TOTAL KNEE ARTHROPLASTY Left 05/27/2023   Procedure: TOTAL KNEE ARTHROPLASTY;  Surgeon: Edna Toribio LABOR, MD;  Location: [facility];  Service: Orthopedics;  Laterality: Left;   TUBAL  LIGATION  04/1981    Family History  Problem Relation Age of Onset   Breast cancer Mother 63   Stroke Mother    Cancer Mother    Hypertension Father    Heart attack Father    Arthritis Father    Heart disease Father    Diabetes Sister    Breast cancer Paternal Aunt    Breast cancer Paternal Grandmother        dx 74s   Rheum arthritis Daughter    Diabetes Sister    Hearing loss Sister      Social Connections: Moderately Integrated (06/18/2024)   Social Connection and Isolation Panel    Frequency of Communication with Friends and Family: Three times a week    Frequency of Social Gatherings with Friends and Family: Three times a week    Attends Religious Services: More than 4 times per year    Active Member of Clubs or Organizations: Yes    Attends Banker Meetings: More than 4 times per year    Marital Status: Widowed      Current Outpatient Medications:    Acetylcysteine (NAC) 500 MG CAPS, Take 500 mg by mouth 2 (two) times daily., Disp: , Rfl:    ALPHA LIPOIC ACID PO, Take by mouth., Disp: , Rfl:    amoxicillin  (AMOXIL ) 500 MG capsule, Take 4 capsules (2,000 mg total) by mouth 1 hour prior to dental work., Disp: 12 capsule, Rfl: 0   Ascorbic Acid (VITAMIN C) 500 MG CAPS, Take 500 mg by mouth daily. liposoma, Disp: , Rfl:    b complex vitamins capsule, Take 1 capsule by mouth daily., Disp: , Rfl:    Cholecalciferol (VITAMIN D3) 125 MCG (5000 UT) CAPS, Take 5,000 Units by mouth daily., Disp: , Rfl:    clobetasol  cream (TEMOVATE ) 0.05 %, Apply 1 Application topically 2 (two) times daily., Disp: 30 g, Rfl: 0   co-enzyme Q-10 30 MG capsule, Take 100 mg by mouth daily., Disp: , Rfl:    Collagen-Vitamin C-Biotin (COLLAGEN PO), 1 scoop daily- without Vitamin C, Disp: , Rfl:    diltiazem  (CARDIZEM ) 30 MG tablet, Take 1 tablet every 4 hours AS NEEDED for AFIB heart rate >100 as long as top BP >100., Disp: 30 tablet, Rfl: 1   ELIQUIS  5 MG TABS tablet, TAKE 1 TABLET TWICE A  DAY, Disp: 180 tablet, Rfl: 3   GLYCINE PO, Take 3,000 mg by mouth at bedtime., Disp: , Rfl:    lidocaine  4 %, Place 1 patch onto the skin daily as needed (pain)., Disp: , Rfl:    MAGNESIUM PO, Take 280 mg by mouth at bedtime., Disp: , Rfl:    metoprolol  succinate (TOPROL  XL) 25 MG 24 hr tablet, Take 0.5 tablets (12.5 mg total) by mouth at bedtime., Disp: 45 tablet, Rfl: 1   milk thistle 175 MG tablet, Take 380 mg by mouth 2 (two) times daily., Disp: , Rfl:    Omega-3 Fatty Acids (FISH OIL PO), Take 640 mg by mouth 2 (two) times daily., Disp: ,  Rfl:    OnabotulinumtoxinA (BOTOX IJ), In her eyebrow- every 4-6 months, Disp: , Rfl:    OVER THE COUNTER MEDICATION, Take 30 mg by mouth 2 (two) times daily. Bio-Quercetin supplement, Disp: , Rfl:    oxymetazoline (AFRIN) 0.05 % nasal spray, Place 1 spray into both nostrils at bedtime as needed for congestion., Disp: , Rfl:    prednisoLONE acetate (PRED FORTE) 1 % ophthalmic suspension, Place 1 drop into surgical eyes 4 (four) times daily., Disp: 15 mL, Rfl: 1   predniSONE  (DELTASONE ) 20 MG tablet, Take 1 tablet (20 mg total) by mouth daily with breakfast for 5 days., Disp: 5 tablet, Rfl: 0   tretinoin  (RETIN-A ) 0.05 % cream, Apply topically at bedtime., Disp: , Rfl:    tretinoin  (RETIN-A ) 0.1 % cream, Apply 1 Application topically at bedtime., Disp: 45 g, Rfl: 2   triamcinolone  cream (KENALOG ) 0.1 %, Apply topically once a day as needed (eczema)., Disp: 450 g, Rfl: 1   triamcinolone  cream (KENALOG ) 0.1 %, Apply to affected area once daily as needed for excema., Disp: 454 g, Rfl: 1   Vitamin D -Vitamin K (VITAMIN K2-VITAMIN D3 PO), Take 1 tablet by mouth daily. Vitamin d  4000 units Vitamin K2 100 mg, Disp: , Rfl:    Zinc Sulfate (ZINC 15 PO), Take 15 mg by mouth daily., Disp: , Rfl:    bacitracin -polymyxin b  (POLYSPORIN ) ophthalmic ointment, Apply a small amount 3 times daily to surgical site. (Patient not taking: Reported on 10/05/2024), Disp: 3.5 g, Rfl:  3   betamethasone  dipropionate 0.05 % cream, Apply liberally to affected skin 2 (two) times daily. (Patient not taking: Reported on 10/05/2024), Disp: 45 g, Rfl: 0   Oxymetazoline HCl (UPNEEQ) 0.1 % SOLN, INSTILL 1 DROP INTO AFFECTED EYE(S) BY OPHTHALMIC ROUTE ONCE DAILY (Patient not taking: Reported on 10/05/2024), Disp: , Rfl:    Physical Exam:   BP 112/71 (BP Location: Left Arm, Patient Position: Sitting, Cuff Size: Normal)   Pulse 79   Ht 5' 9.5 (1.765 m)   Wt 150 lb (68 kg)   SpO2 95%   BMI 21.83 kg/m   Salient findings:  CN II-XII intact Bilateral EAC clear and TM intact with well pneumatized middle ear spaces Anterior rhinoscopy: Septum deviates right; bilateral inferior turbinates with modest hypertrophy; Nasal endoscopy was indicated to better evaluate the nose and paranasal sinuses, given the patient's history and exam findings, and is detailed below. Mod cottle neg b/l No lesions of oral cavity/oropharynx; tonsils absent No obviously palpable neck masses/lymphadenopathy/thyromegaly No respiratory distress or stridor  Seprately Identifiable Procedures:  Prior to initiating any procedures, risks/benefits/alternatives were explained to the patient and verbal consent obtained. PROCEDURE: Bilateral Diagnostic Rigid Nasal Endoscopy Pre-procedure diagnosis: Concern for nasal congestion, nasal obstruction Post-procedure diagnosis: same Indication: See pre-procedure diagnosis and physical exam above Complications: None apparent EBL: 0 mL Anesthesia: Lidocaine  4% and topical decongestant was topically sprayed in each nasal cavity  Description of Procedure:  Patient was identified. A rigid 30 degree endoscope was utilized to evaluate the sinonasal cavities, mucosa, sinus ostia and turbinates and septum.  Overall, signs of mucosal inflammation are not noted.  No mucopurulence, polyps, or masses noted.   Right Middle meatus: clear Right SE Recess: clear Left MM: clear Left SE  Recess: clear Photodocumentation was obtained.  CPT CODE -- 68768 - Mod 25  Electronically signed by: Eldora KATHEE Blanch, MD 10/05/2024 3:18 PM   Impression & Plans:  Patra Rascon is a 73 y.o. female with:  1.  Nasal congestion   2. Hypertrophy of both inferior nasal turbinates   3. Nasal septal deviation   4. Nasal obstruction   5. OSA (obstructive sleep apnea)   6. Ear fullness, left    Noted multiple complaints: - OSA: mild, tonsils absent; given AHI and BMI, we briefly discussed surgical approaches but may not be reliable --- UPPP/ESP, hyoid suspension (and with risk of complications). Can consider oral appliance as well v/s CPAP. Will refer to sleep med From Ear fullness/ETD standpoint: no effusion, but we discussed mgmt of ETD and will start with pred burst, autoinsufflation and consider Sinus rinses daily; also can use flonase; will get audio as well From Nasal obstruction and septal deviation: consider septo/turbs but is afrin responsive so certainly a good candidate, but will try medical mgmt first  - f/u 2 months with audio  See below regarding exact medications prescribed this encounter including dosages and route: Meds ordered this encounter  Medications   predniSONE  (DELTASONE ) 20 MG tablet    Sig: Take 1 tablet (20 mg total) by mouth daily with breakfast for 5 days.    Dispense:  5 tablet    Refill:  0      Thank you for allowing me the opportunity to care for your patient. Please do not hesitate to contact me should you have any other questions.  Sincerely, Eldora Blanch, MD Otolaryngologist (ENT), [facility] Phone: [phone] Fax: [phone]  10/05/2024, 3:18 PM   MDM:  Level 4 Complexity/Problems addressed: mod - multiple chronic problems Data complexity: mod - independent review of note, labs, review of test - Morbidity: mod  - Prescription Drug prescribed or managed: y

## 2024-10-07 ENCOUNTER — Other Ambulatory Visit

## 2024-10-07 DIAGNOSIS — Z006 Encounter for examination for normal comparison and control in clinical research program: Secondary | ICD-10-CM

## 2024-10-08 ENCOUNTER — Ambulatory Visit
Admission: RE | Admit: 2024-10-08 | Discharge: 2024-10-08 | Disposition: A | Discharge status: Home / Self Care | Source: Ambulatory Visit | Attending: Surgery | Admitting: Surgery

## 2024-10-08 DIAGNOSIS — Z1239 Encounter for other screening for malignant neoplasm of breast: Secondary | ICD-10-CM | POA: Diagnosis not present

## 2024-10-08 DIAGNOSIS — Z9189 Other specified personal risk factors, not elsewhere classified: Secondary | ICD-10-CM

## 2024-10-08 MED ORDER — GADOPICLENOL 0.5 MMOL/ML IV SOLN
7.0000 mL | Freq: Once | INTRAVENOUS | Status: AC | PRN
  Administered 2024-10-08: 7 mL via INTRAVENOUS

## 2024-10-12 ENCOUNTER — Ambulatory Visit: Admitting: Family Medicine

## 2024-10-12 ENCOUNTER — Encounter: Payer: Self-pay | Admitting: Family Medicine

## 2024-10-12 VITALS — BP 131/77 | HR 59 | Ht 69.5 in | Wt 155.0 lb

## 2024-10-12 DIAGNOSIS — Z0000 Encounter for general adult medical examination without abnormal findings: Secondary | ICD-10-CM | POA: Diagnosis not present

## 2024-10-12 DIAGNOSIS — R1906 Epigastric swelling, mass or lump: Secondary | ICD-10-CM | POA: Insufficient documentation

## 2024-10-12 DIAGNOSIS — E7800 Pure hypercholesterolemia, unspecified: Secondary | ICD-10-CM

## 2024-10-12 DIAGNOSIS — I480 Paroxysmal atrial fibrillation: Secondary | ICD-10-CM | POA: Diagnosis not present

## 2024-10-12 NOTE — Assessment & Plan Note (Signed)
 Well adult Orders Placed This Encounter  Procedures   US  Abdomen Complete    Standing Status:   Future    Expiration Date:   10/12/2025    Reason for Exam (SYMPTOM  OR DIAGNOSIS REQUIRED):   Epugastric fullness    Preferred imaging location?:   [facility]   CMP14+EGFR   CBC with Differential/Platelet   Lipid Panel With LDL/HDL Ratio   Lipase   Amylase   TSH + free T4  Screenings: per lab orders Immunizations:  She will get flu shot at pharmacy.  Anticipatory guidance/Risk factor reduction:  Recommendations per AVS.

## 2024-10-12 NOTE — Assessment & Plan Note (Signed)
 Mixed HLD.  Elevated VLDL and LDL levels.  Family history of CAD.  Not currently on statin. Agatston score of 0 in 2023.

## 2024-10-12 NOTE — Assessment & Plan Note (Signed)
 Abdominal US ordered.

## 2024-10-12 NOTE — Assessment & Plan Note (Signed)
 No recent episodes.  Remains on metoprolol  and eliquis .

## 2024-10-12 NOTE — Progress Notes (Signed)
 Donna Ramsey - 73 y.o. female MRN [id number]  Date of birth: 31-Jan-1951  Subjective Chief Complaint  Patient presents with   Annual Exam    HPI Donna Ramsey is a 73 y.o. female here today for annual exam.   She reports that she is doing pretty well.  Having some increased gas and epigastric fullness sensation.  occasional pain.  Denies nausea or vomiting.    She is staying pretty active.  She feels that she does well with diet.   She is a non-smoker. Occasional EtOH.   Review of Systems  Constitutional:  Negative for chills, fever, malaise/fatigue and weight loss.  HENT:  Negative for congestion, ear pain and sore throat.   Eyes:  Negative for blurred vision, double vision and pain.  Respiratory:  Negative for cough and shortness of breath.   Cardiovascular:  Negative for chest pain and palpitations.  Gastrointestinal:  Negative for abdominal pain, blood in stool, constipation, heartburn and nausea.  Genitourinary:  Negative for dysuria and urgency.  Musculoskeletal:  Negative for joint pain and myalgias.  Neurological:  Negative for dizziness and headaches.  Endo/Heme/Allergies:  Does not bruise/bleed easily.  Psychiatric/Behavioral:  Negative for depression. The patient is not nervous/anxious and does not have insomnia.     Allergies  Allergen Reactions   Clindamycin Hives   Codeine Rash and Dermatitis   Pentazocine Hives   Sulfamethoxazole-Trimethoprim Itching and Rash     2017 Reaction: rash, itching 03/16/2021: faint rash and itching 30 min after bactrim 400/80 oral challenge.    Ciprofloxacin Dermatitis   Doxycycline Other (See Comments) and Itching    Itching without rash near end of course   Shellfish Allergy Other (See Comments)    Only scallops  shellfish derived   Levofloxacin Other (See Comments) and Rash    tendonitis  levofloxacin   Neomycin Itching   Sulfa Antibiotics Rash and Dermatitis    Past Medical History:  Diagnosis Date   A-fib  (HCC)    Allergy    Arthritis    Asplenia    Asthma    as a child   Family history of breast cancer    GERD (gastroesophageal reflux disease)    Hypertension    Liver disease, unspecified    heterogenous echotexture   Pneumonia    PONV (postoperative nausea and vomiting)    Portal hypertension (HCC)    Sleep apnea    mild  no cpap   Splenic infarct     Past Surgical History:  Procedure Laterality Date   ABDOMINAL HYSTERECTOMY     BREAST BIOPSY Bilateral 50 yrs ago   benign   CHONDROPLASTY Left 07/28/2020   Procedure: CHONDROPLASTY LEFT KNEE;  Surgeon: Cristy Bonner DASEN, MD;  Location: [facility];  Service: Orthopedics;  Laterality: Left;   HERNIA REPAIR     INGUINAL HERNIA REPAIR Right 05/10/2021   Procedure: RIGHT INGUINAL HERNIA REPAIR WITH MESH;  Surgeon: Vernetta Berg, MD;  Location: [facility];  Service: General;  Laterality: Right;   KNEE ARTHROSCOPY WITH LATERAL MENISECTOMY Left 07/28/2020   Procedure: LEFT KNEE ARTHROSCOPY WITH LATERAL MENISECTOMY;  Surgeon: Cristy Bonner DASEN, MD;  Location: [facility];  Service: Orthopedics;  Laterality: Left;   KNEE ARTHROSCOPY WITH MEDIAL MENISECTOMY Left 07/28/2020   Procedure: LEFT KNEE ARTHROSCOPY WITH MEDIAL MENISECTOMY;  Surgeon: Cristy Bonner DASEN, MD;  Location: [facility];  Service: Orthopedics;  Laterality: Left;   SPLENECTOMY, TOTAL     TOTAL  KNEE ARTHROPLASTY Left 05/27/2023   Procedure: TOTAL KNEE ARTHROPLASTY;  Surgeon: Edna Toribio LABOR, MD;  Location: [facility];  Service: Orthopedics;  Laterality: Left;   TUBAL LIGATION  04/1981    Social History   Socioeconomic History   Marital status: Widowed    Spouse name: Not on file   Number of children: 3   Years of education: 16   Highest education level: Bachelor's degree (e.g., BA, AB, BS)  Occupational History    Comment: Retired Teacher, Early Years/pre  Tobacco Use   Smoking status: Never   Smokeless tobacco: Never   Tobacco comments:     Never smoked 01/03/24  Vaping Use   Vaping status: Never Used  Substance and Sexual Activity   Alcohol  use: Yes    Alcohol /week: 7.0 standard drinks of alcohol     Types: 7 Glasses of wine per week    Comment: 1 glass of wine nightly 01/03/24   Drug use: Never   Sexual activity: Never  Other Topics Concern   Not on file  Social History Narrative   Lives alone but next to her daughter. She is walking 3-5 miles per day. She likes to read, cook and do yard work.   Social Drivers of Corporate Investment Banker Strain: Low Risk  (06/18/2024)   Overall Financial Resource Strain (CARDIA)    Difficulty of Paying Living Expenses: Not hard at all  Food Insecurity: No Food Insecurity (06/18/2024)   Hunger Vital Sign    Worried About Running Out of Food in the Last Year: Never true    Ran Out of Food in the Last Year: Never true  Transportation Needs: No Transportation Needs (06/18/2024)   PRAPARE - Administrator, Civil Service (Medical): No    Lack of Transportation (Non-Medical): No  Physical Activity: Sufficiently Active (06/18/2024)   Exercise Vital Sign    Days of Exercise per Week: 5 days    Minutes of Exercise per Session: 90 min  Stress: No Stress Concern Present (06/18/2024)   [employer] of Occupational Health - Occupational Stress Questionnaire    Feeling of Stress: Only a little  Social Connections: Moderately Integrated (06/18/2024)   Social Connection and Isolation Panel    Frequency of Communication with Friends and Family: Three times a week    Frequency of Social Gatherings with Friends and Family: Three times a week    Attends Religious Services: More than 4 times per year    Active Member of Clubs or Organizations: Yes    Attends Banker Meetings: More than 4 times per year    Marital Status: Widowed    Family History  Problem Relation Age of Onset   Breast cancer Mother 70   Stroke Mother    Cancer Mother    Hypertension Father    Heart  attack Father    Arthritis Father    Heart disease Father    Diabetes Sister    Breast cancer Paternal Aunt    Breast cancer Paternal Grandmother        dx 7s   Rheum arthritis Daughter    Diabetes Sister    Hearing loss Sister     Health Maintenance  Topic Date Due   Medicare Annual Wellness (AWV)  10/20/2023   DTaP/Tdap/Td (4 - Td or Tdap) 08/26/2025 (Originally 03/29/2021)   Pneumococcal Vaccine: 50+ Years (4 of 4 - PCV20 or PCV21) 08/26/2025 (Originally 01/15/2019)   Meningococcal B Vaccine (1 of 4 - Increased Risk) 08/26/2025 (  Originally 09[birth year]15/1963)   COVID-19 Vaccine (4 - 2025-26 season) 10/28/2025 (Originally 08/17/2024)   Mammogram  10/08/2026   Colonoscopy  10/07/2028   Influenza Vaccine  Completed   DEXA SCAN  Completed   Hepatitis C Screening  Completed   Zoster Vaccines- Shingrix  Completed     ----------------------------------------------------------------------------------------------------------------------------------------------------------------------------------------------------------------- Physical Exam BP 131/77 (BP Location: Left Arm, Patient Position: Sitting, Cuff Size: Small)   Pulse (!) 59   Ht 5' 9.5 (1.765 m)   Wt 155 lb (70.3 kg)   SpO2 100%   BMI 22.56 kg/m   Physical Exam Constitutional:      General: She is not in acute distress. HENT:     Head: Normocephalic and atraumatic.     Right Ear: Tympanic membrane and ear canal normal.     Left Ear: Tympanic membrane and ear canal normal.     Nose: Nose normal.  Eyes:     General: No scleral icterus.    Conjunctiva/sclera: Conjunctivae normal.  Neck:     Thyroid : No thyromegaly.  Cardiovascular:     Rate and Rhythm: Normal rate and regular rhythm.     Heart sounds: Normal heart sounds.  Pulmonary:     Effort: Pulmonary effort is normal.     Breath sounds: Normal breath sounds.  Abdominal:     General: Bowel sounds are normal. There is no distension.     Palpations: Abdomen is soft.      Tenderness: There is no abdominal tenderness. There is no guarding.  Musculoskeletal:        General: Normal range of motion.     Cervical back: Normal range of motion and neck supple.  Lymphadenopathy:     Cervical: No cervical adenopathy.  Skin:    General: Skin is warm and dry.     Findings: No rash.  Neurological:     General: No focal deficit present.     Mental Status: She is alert and oriented to person, place, and time.     Cranial Nerves: No cranial nerve deficit.     Coordination: Coordination normal.  Psychiatric:        Mood and Affect: Mood normal.        Behavior: Behavior normal.     ------------------------------------------------------------------------------------------------------------------------------------------------------------------------------------------------------------------- Assessment and Plan  Well adult exam Well adult Orders Placed This Encounter  Procedures   US  Abdomen Complete    Standing Status:   Future    Expiration Date:   10/12/2025    Reason for Exam (SYMPTOM  OR DIAGNOSIS REQUIRED):   Epugastric fullness    Preferred imaging location?:   [facility]   CMP14+EGFR   CBC with Differential/Platelet   Lipid Panel With LDL/HDL Ratio   Lipase   Amylase   TSH + free T4  Screenings: per lab orders Immunizations:  She will get flu shot at pharmacy.  Anticipatory guidance/Risk factor reduction:  Recommendations per AVS.   Paroxysmal atrial fibrillation (HCC) No recent episodes.  Remains on metoprolol  and eliquis .    Elevated LDL cholesterol level Mixed HLD.  Elevated VLDL and LDL levels.  Family history of CAD.  Not currently on statin. Agatston score of 0 in 2023.   Epigastric fullness Abdominal US  ordered.    No orders of the defined types were placed in this encounter.   No follow-ups on file.

## 2024-10-12 NOTE — Patient Instructions (Signed)
 Preventive Care 83 Years and Older, Female Preventive care refers to lifestyle choices and visits with your health care provider that can promote health and wellness. Preventive care visits are also called wellness exams. What can I expect for my preventive care visit? Counseling Your health care provider may ask you questions about your: Medical history, including: Past medical problems. Family medical history. Pregnancy and menstrual history. History of falls. Current health, including: Memory and ability to understand (cognition). Emotional well-being. Home life and relationship well-being. Sexual activity and sexual health. Lifestyle, including: Alcohol, nicotine or tobacco, and drug use. Access to firearms. Diet, exercise, and sleep habits. Work and work Astronomer. Sunscreen use. Safety issues such as seatbelt and bike helmet use. Physical exam Your health care provider will check your: Height and weight. These may be used to calculate your BMI (body mass index). BMI is a measurement that tells if you are at a healthy weight. Waist circumference. This measures the distance around your waistline. This measurement also tells if you are at a healthy weight and may help predict your risk of certain diseases, such as type 2 diabetes and high blood pressure. Heart rate and blood pressure. Body temperature. Skin for abnormal spots. What immunizations do I need?  Vaccines are usually given at various ages, according to a schedule. Your health care provider will recommend vaccines for you based on your age, medical history, and lifestyle or other factors, such as travel or where you work. What tests do I need? Screening Your health care provider may recommend screening tests for certain conditions. This may include: Lipid and cholesterol levels. Hepatitis C test. Hepatitis B test. HIV (human immunodeficiency virus) test. STI (sexually transmitted infection) testing, if you are at  risk. Lung cancer screening. Colorectal cancer screening. Diabetes screening. This is done by checking your blood sugar (glucose) after you have not eaten for a while (fasting). Mammogram. Talk with your health care provider about how often you should have regular mammograms. BRCA-related cancer screening. This may be done if you have a family history of breast, ovarian, tubal, or peritoneal cancers. Bone density scan. This is done to screen for osteoporosis. Talk with your health care provider about your test results, treatment options, and if necessary, the need for more tests. Follow these instructions at home: Eating and drinking  Eat a diet that includes fresh fruits and vegetables, whole grains, lean protein, and low-fat dairy products. Limit your intake of foods with high amounts of sugar, saturated fats, and salt. Take vitamin and mineral supplements as recommended by your health care provider. Do not drink alcohol if your health care provider tells you not to drink. If you drink alcohol: Limit how much you have to 0-1 drink a day. Know how much alcohol is in your drink. In the [place] one drink equals one 12 oz bottle of beer (355 mL), one 5 oz glass of wine (148 mL), or one 1 oz glass of hard liquor (44 mL). Lifestyle Brush your teeth every morning and night with fluoride toothpaste. Floss one time each day. Exercise for at least 30 minutes 5 or more days each week. Do not use any products that contain nicotine or tobacco. These products include cigarettes, chewing tobacco, and vaping devices, such as e-cigarettes. If you need help quitting, ask your health care provider. Do not use drugs. If you are sexually active, practice safe sex. Use a condom or other form of protection in order to prevent STIs. Take aspirin only as told by  your health care provider. Make sure that you understand how much to take and what form to take. Work with your health care provider to find out whether it  is safe and beneficial for you to take aspirin daily. Ask your health care provider if you need to take a cholesterol-lowering medicine (statin). Find healthy ways to manage stress, such as: Meditation, yoga, or listening to music. Journaling. Talking to a trusted person. Spending time with friends and family. Minimize exposure to UV radiation to reduce your risk of skin cancer. Safety Always wear your seat belt while driving or riding in a vehicle. Do not drive: If you have been drinking alcohol. Do not ride with someone who has been drinking. When you are tired or distracted. While texting. If you have been using any mind-altering substances or drugs. Wear a helmet and other protective equipment during sports activities. If you have firearms in your house, make sure you follow all gun safety procedures. What's next? Visit your health care provider once a year for an annual wellness visit. Ask your health care provider how often you should have your eyes and teeth checked. Stay up to date on all vaccines. This information is not intended to replace advice given to you by your health care provider. Make sure you discuss any questions you have with your health care provider. Document Revised: 05/31/2021 Document Reviewed: 05/31/2021 Elsevier Patient Education  2024 [employer].

## 2024-10-13 ENCOUNTER — Other Ambulatory Visit (HOSPITAL_COMMUNITY): Payer: Self-pay

## 2024-10-13 LAB — AMYLASE: Amylase: 73 U/L (ref 31–110)

## 2024-10-13 LAB — CBC WITH DIFFERENTIAL/PLATELET
Basophils Absolute: 0.1 x10E3/uL (ref 0.0–0.2)
Basos: 2 %
EOS (ABSOLUTE): 0.1 x10E3/uL (ref 0.0–0.4)
Eos: 1 %
Hematocrit: 39.2 % (ref 34.0–46.6)
Hemoglobin: 13.4 g/dL (ref 11.1–15.9)
Immature Grans (Abs): 0 x10E3/uL (ref 0.0–0.1)
Immature Granulocytes: 0 %
Lymphocytes Absolute: 2.2 x10E3/uL (ref 0.7–3.1)
Lymphs: 42 %
MCH: 34.2 pg — ABNORMAL HIGH (ref 26.6–33.0)
MCHC: 34.2 g/dL (ref 31.5–35.7)
MCV: 100 fL — ABNORMAL HIGH (ref 79–97)
Monocytes Absolute: 0.8 x10E3/uL (ref 0.1–0.9)
Monocytes: 15 %
Neutrophils Absolute: 2.2 x10E3/uL (ref 1.4–7.0)
Neutrophils: 40 %
Platelets: 331 x10E3/uL (ref 150–450)
RBC: 3.92 x10E6/uL (ref 3.77–5.28)
RDW: 13.4 % (ref 11.7–15.4)
WBC: 5.4 x10E3/uL (ref 3.4–10.8)

## 2024-10-13 LAB — CMP14+EGFR
ALT: 25 IU/L (ref 0–32)
AST: 33 IU/L (ref 0–40)
Albumin: 4.1 g/dL (ref 3.8–4.8)
Alkaline Phosphatase: 75 IU/L (ref 49–135)
BUN/Creatinine Ratio: 29 — ABNORMAL HIGH (ref 12–28)
BUN: 19 mg/dL (ref 8–27)
Bilirubin Total: 0.8 mg/dL (ref 0.0–1.2)
CO2: 22 mmol/L (ref 20–29)
Calcium: 9.9 mg/dL (ref 8.7–10.3)
Chloride: 102 mmol/L (ref 96–106)
Creatinine, Ser: 0.66 mg/dL (ref 0.57–1.00)
Globulin, Total: 2.7 g/dL (ref 1.5–4.5)
Glucose: 86 mg/dL (ref 70–99)
Potassium: 4.2 mmol/L (ref 3.5–5.2)
Sodium: 139 mmol/L (ref 134–144)
Total Protein: 6.8 g/dL (ref 6.0–8.5)
eGFR: 94 mL/min/1.73 (ref >59)

## 2024-10-13 LAB — TSH+FREE T4
Free T4: 1.27 ng/dL (ref 0.82–1.77)
TSH: 2.84 u[IU]/mL (ref 0.450–4.500)

## 2024-10-13 LAB — LIPASE: Lipase: 35 U/L (ref 14–85)

## 2024-10-13 LAB — LIPID PANEL WITH LDL/HDL RATIO
Cholesterol, Total: 254 mg/dL — ABNORMAL HIGH (ref 100–199)
HDL: 145 mg/dL (ref >39)
LDL Chol Calc (NIH): 103 mg/dL — ABNORMAL HIGH (ref 0–99)
LDL/HDL Ratio: 0.7 ratio (ref 0.0–3.2)
Triglycerides: 35 mg/dL (ref 0–149)
VLDL Cholesterol Cal: 6 mg/dL (ref 5–40)

## 2024-10-13 LAB — SPECIMEN STATUS REPORT

## 2024-10-16 ENCOUNTER — Other Ambulatory Visit

## 2024-10-16 DIAGNOSIS — R1906 Epigastric swelling, mass or lump: Secondary | ICD-10-CM | POA: Diagnosis not present

## 2024-10-16 DIAGNOSIS — K7689 Other specified diseases of liver: Secondary | ICD-10-CM | POA: Diagnosis not present

## 2024-10-16 DIAGNOSIS — Z9081 Acquired absence of spleen: Secondary | ICD-10-CM | POA: Diagnosis not present

## 2024-10-21 ENCOUNTER — Encounter: Payer: Self-pay | Admitting: Family Medicine

## 2024-10-21 DIAGNOSIS — R7989 Other specified abnormal findings of blood chemistry: Secondary | ICD-10-CM

## 2024-10-23 ENCOUNTER — Ambulatory Visit: Payer: Self-pay | Admitting: Family Medicine

## 2024-10-23 DIAGNOSIS — K7460 Unspecified cirrhosis of liver: Secondary | ICD-10-CM

## 2024-10-30 LAB — GENECONNECT MOLECULAR SCREEN: Genetic Analysis Overall Interpretation: NEGATIVE

## 2024-11-06 ENCOUNTER — Other Ambulatory Visit (HOSPITAL_COMMUNITY): Payer: Self-pay

## 2024-11-06 DIAGNOSIS — L03115 Cellulitis of right lower limb: Secondary | ICD-10-CM | POA: Diagnosis not present

## 2024-11-06 DIAGNOSIS — M19072 Primary osteoarthritis, left ankle and foot: Secondary | ICD-10-CM | POA: Diagnosis not present

## 2024-11-06 DIAGNOSIS — M1711 Unilateral primary osteoarthritis, right knee: Secondary | ICD-10-CM | POA: Diagnosis not present

## 2024-11-06 DIAGNOSIS — M19071 Primary osteoarthritis, right ankle and foot: Secondary | ICD-10-CM | POA: Diagnosis not present

## 2024-11-06 MED ORDER — RIFAMPIN 300 MG PO CAPS
300.0000 mg | ORAL_CAPSULE | Freq: Two times a day (BID) | ORAL | 0 refills | Status: AC
  Filled 2024-11-06: qty 14, 7d supply, fill #0

## 2024-11-08 ENCOUNTER — Other Ambulatory Visit (HOSPITAL_COMMUNITY): Payer: Self-pay

## 2024-11-09 ENCOUNTER — Other Ambulatory Visit: Payer: Self-pay

## 2024-11-09 ENCOUNTER — Other Ambulatory Visit (HOSPITAL_COMMUNITY): Payer: Self-pay

## 2024-11-09 ENCOUNTER — Other Ambulatory Visit (HOSPITAL_BASED_OUTPATIENT_CLINIC_OR_DEPARTMENT_OTHER): Payer: Self-pay

## 2024-11-09 MED ORDER — LINEZOLID 600 MG PO TABS
600.0000 mg | ORAL_TABLET | Freq: Two times a day (BID) | ORAL | 0 refills | Status: AC
  Filled 2024-11-09 (×2): qty 10, 5d supply, fill #0

## 2024-11-09 NOTE — Progress Notes (Signed)
 11/10/24- 73 yoF for sleep evaluation with concern of OSA, Insomnia, complicated by Hepatic Cirrhosis, PAFIb, Asplenia, Asthma, MGUS,   Split night PAP 04/15/23- AHI 13.5/hr, titrated to BIPAP IPAP 18, EPAP 14 but with residual AHI 141/hr. Was to have a f/u PAP titration, apparently not done. Epworth score- Body weight today-154 lbs Discussed the use of AI scribe software for clinical note transcription with the patient, who gave verbal consent to proceed.  History of Present Illness   Donna Ramsey is a 73 year old female with mild sleep apnea who presents for evaluation and management of her condition.  She was diagnosed with mild sleep apnea on a sleep study at [facility] in January 2024 with an AHI of 13.5. She has not had formal treatment but has been using a friend's travel CPAP, which improves sleep yet causes bothersome aerophagia. She reports loud snoring and severe nighttime dry mouth. She does not think she would do well with oral appliance.  She has atrial fibrillation diagnosed in January 2024 with two self-limited episodes, including one about a year later that lasted a day.  An ENT diagnosed a deviated septum and enlarged turbinates. She uses Afrin intermittently. CPAP reduces some nasal congestion.  She notes occasional mild daytime tiredness. She wakes 1 to 2 times nightly to urinate and sometimes from nasal congestion.  She is a retired teacher, early years/pre. She walks about 30 miles per week and lost 45 to 50 pounds after her husband's death in 2020/12/16 through increased activity and dietary changes.     Prior to Admission medications   Medication Sig Start Date End Date Taking? Authorizing Provider  Acetylcysteine (NAC) 500 MG CAPS Take 500 mg by mouth 2 (two) times daily.   Yes [provider]  ALPHA LIPOIC ACID PO Take by mouth.   Yes [provider]  amoxicillin  (AMOXIL ) 500 MG capsule Take 4 capsules (2,000 mg total) by mouth 1 hour prior to dental work.  04/13/24  Yes   Ascorbic Acid (VITAMIN C) 500 MG CAPS Take 500 mg by mouth daily. liposoma   Yes [provider]  b complex vitamins capsule Take 1 capsule by mouth daily.   Yes [provider]  bacitracin -polymyxin b  (POLYSPORIN ) ophthalmic ointment Apply a small amount 3 times daily to surgical site. 08/04/24  Yes   betamethasone  dipropionate 0.05 % cream Apply liberally to affected skin 2 (two) times daily. 06/09/24  Yes   Cholecalciferol (VITAMIN D3) 125 MCG (5000 UT) CAPS Take 5,000 Units by mouth daily.   Yes [provider]  clobetasol  cream (TEMOVATE ) 0.05 % Apply 1 Application topically 2 (two) times daily. 06/09/24  Yes Alvia Bring, DO  co-enzyme Q-10 30 MG capsule Take 100 mg by mouth daily.   Yes [provider]  Collagen-Vitamin C-Biotin (COLLAGEN PO) 1 scoop daily- without Vitamin C   Yes [provider]  diltiazem  (CARDIZEM ) 30 MG tablet Take 1 tablet every 4 hours AS NEEDED for AFIB heart rate >100 as long as top BP >100. 02/06/23  Yes Dow Arland BROCKS, NP  ELIQUIS  5 MG TABS tablet TAKE 1 TABLET TWICE A DAY 09/17/24  Yes Cindie Ole DASEN, MD  GLYCINE PO Take 3,000 mg by mouth at bedtime.   Yes [provider]  lidocaine  4 % Place 1 patch onto the skin daily as needed (pain).   Yes [provider]  linezolid  (ZYVOX ) 600 MG tablet Take 1 tablet (600 mg total) by mouth every 12 (twelve) hours.  11/08/24  Yes   MAGNESIUM PO Take 280 mg by mouth at bedtime.   Yes [provider]  metoprolol  succinate (TOPROL  XL) 25 MG 24 hr tablet Take 0.5 tablets (12.5 mg total) by mouth at bedtime. 07/01/24  Yes Fenton, Clint R, PA  milk thistle 175 MG tablet Take 380 mg by mouth 2 (two) times daily.   Yes [provider]  Omega-3 Fatty Acids (FISH OIL PO) Take 640 mg by mouth 2 (two) times daily.   Yes [provider]  OnabotulinumtoxinA (BOTOX IJ) In her eyebrow- every 4-6 months   Yes [provider]   OVER THE COUNTER MEDICATION Take 30 mg by mouth 2 (two) times daily. Bio-Quercetin supplement   Yes [provider]  Oxymetazoline HCl (UPNEEQ) 0.1 % SOLN INSTILL 1 DROP INTO AFFECTED EYE(S) BY OPHTHALMIC ROUTE ONCE DAILY 05/07/24  Yes [provider]  rifampin  (RIFADIN ) 300 MG capsule Take 1 capsule (300 mg total) by mouth 2 (two) times daily for 7 days. 11/06/24  Yes   tretinoin  (RETIN-A ) 0.1 % cream Apply 1 Application topically at bedtime. 08/20/24  Yes   triamcinolone  cream (KENALOG ) 0.1 % Apply topically once a day as needed (eczema). 11/06/22  Yes Alvia Bring, DO  Vitamin D -Vitamin K (VITAMIN K2-VITAMIN D3 PO) Take 1 tablet by mouth daily. Vitamin d  4000 units Vitamin K2 100 mg   Yes [provider]  Zinc Sulfate (ZINC 15 PO) Take 15 mg by mouth daily.   Yes [provider]   Past Medical History:  Diagnosis Date   A-fib (HCC)    Allergy    Arthritis    Asplenia    Asthma    as a child   Family history of breast cancer    GERD (gastroesophageal reflux disease)    Hypertension    Liver disease, unspecified    heterogenous echotexture   Pneumonia    PONV (postoperative nausea and vomiting)    Portal hypertension (HCC)    Sleep apnea    mild  no cpap   Splenic infarct    Past Surgical History:  Procedure Laterality Date   ABDOMINAL HYSTERECTOMY     BREAST BIOPSY Bilateral 50 yrs ago   benign   CHONDROPLASTY Left 07/28/2020   Procedure: CHONDROPLASTY LEFT KNEE;  Surgeon: Cristy Bonner DASEN, MD;  Location: [facility];  Service: Orthopedics;  Laterality: Left;   HERNIA REPAIR     INGUINAL HERNIA REPAIR Right 05/10/2021   Procedure: RIGHT INGUINAL HERNIA REPAIR WITH MESH;  Surgeon: Vernetta Berg, MD;  Location: [facility];  Service: General;  Laterality: Right;   KNEE ARTHROSCOPY WITH LATERAL MENISECTOMY Left 07/28/2020   Procedure: LEFT KNEE ARTHROSCOPY WITH LATERAL MENISECTOMY;  Surgeon: Cristy Bonner DASEN, MD;  Location: [facility];  Service: Orthopedics;  Laterality: Left;   KNEE ARTHROSCOPY WITH MEDIAL MENISECTOMY Left 07/28/2020   Procedure: LEFT KNEE ARTHROSCOPY WITH MEDIAL MENISECTOMY;  Surgeon: Cristy Bonner DASEN, MD;  Location: [facility];  Service: Orthopedics;  Laterality: Left;   SPLENECTOMY, TOTAL     TOTAL KNEE ARTHROPLASTY Left 05/27/2023   Procedure: TOTAL KNEE ARTHROPLASTY;  Surgeon: Edna Toribio LABOR, MD;  Location: [facility];  Service: Orthopedics;  Laterality: Left;   TUBAL LIGATION  04/1981   Family History  Problem Relation Age of Onset   Breast cancer Mother 93   Stroke Mother    Cancer Mother    Hypertension Father    Heart attack Father  Arthritis Father    Heart disease Father    Diabetes Sister    Breast cancer Paternal Aunt    Breast cancer Paternal Grandmother        dx 11s   Rheum arthritis Daughter    Diabetes Sister    Hearing loss Sister    Social History   Socioeconomic History   Marital status: Widowed    Spouse name: Not on file   Number of children: 3   Years of education: 16   Highest education level: Bachelor's degree (e.g., BA, AB, BS)  Occupational History    Comment: Retired Teacher, Early Years/pre  Tobacco Use   Smoking status: Never   Smokeless tobacco: Never   Tobacco comments:    Never smoked 01/03/24  Vaping Use   Vaping status: Never Used  Substance and Sexual Activity   Alcohol  use: Yes    Alcohol /week: 7.0 standard drinks of alcohol     Types: 7 Glasses of wine per week    Comment: 1 glass of wine nightly 01/03/24   Drug use: Never   Sexual activity: Never  Other Topics Concern   Not on file  Social History Narrative   Lives alone but next to her daughter. She is walking 3-5 miles per day. She likes to read, cook and do yard work.   Social Drivers of Corporate Investment Banker Strain: Low Risk  (06/18/2024)   Overall Financial Resource Strain (CARDIA)    Difficulty of Paying Living Expenses: Not hard at all  Food Insecurity: No  Food Insecurity (06/18/2024)   Hunger Vital Sign    Worried About Running Out of Food in the Last Year: Never true    Ran Out of Food in the Last Year: Never true  Transportation Needs: No Transportation Needs (06/18/2024)   PRAPARE - Administrator, Civil Service (Medical): No    Lack of Transportation (Non-Medical): No  Physical Activity: Sufficiently Active (06/18/2024)   Exercise Vital Sign    Days of Exercise per Week: 5 days    Minutes of Exercise per Session: 90 min  Stress: No Stress Concern Present (06/18/2024)   [employer] of Occupational Health - Occupational Stress Questionnaire    Feeling of Stress: Only a little  Social Connections: Moderately Integrated (06/18/2024)   Social Connection and Isolation Panel    Frequency of Communication with Friends and Family: Three times a week    Frequency of Social Gatherings with Friends and Family: Three times a week    Attends Religious Services: More than 4 times per year    Active Member of Clubs or Organizations: Yes    Attends Banker Meetings: More than 4 times per year    Marital Status: Widowed  Intimate Partner Violence: Not At Risk (06/18/2024)   Humiliation, Afraid, Rape, and Kick questionnaire    Fear of Current or Ex-Partner: No    Emotionally Abused: No    Physically Abused: No    Sexually Abused: No   Assessment and Plan:    Mild obstructive sleep apnea AHI of 13.5 events/hour. CPAP partially effective with aerophagia and flatulence. Untreated apnea increases Parkinson's and dementia risks. CPAP preferred for symptom relief. - Ordered CPAP machine with auto-PAP settings (5-15 cm H2O) through patient care coordinators. - Coordinated with home care company for CPAP setup and mask fitting. - Scheduled follow-up appointment between 31 and 90 days post-CPAP initiation to assess usage and effectiveness. - Recommended Biotene mouthwash for dry mouth management.  ROS-see HPI   + =  positive Constitutional:    weight loss, night sweats, fevers, chills, fatigue, lassitude. HEENT:    headaches, difficulty swallowing, tooth/dental problems, sore throat,       sneezing, itching, ear ache, nasal congestion, post nasal drip, snoring CV:    chest pain, orthopnea, PND, swelling in lower extremities, anasarca,                                   dizziness, palpitations Resp:   shortness of breath with exertion or at rest.                productive cough,   non-productive cough, coughing up of blood.              change in color of mucus.  wheezing.   Skin:    rash or lesions. GI:  No-   heartburn, indigestion, abdominal pain, nausea, vomiting, diarrhea,                 change in bowel habits, loss of appetite GU: dysuria, change in color of urine, no urgency or frequency.   flank pain. MS:   joint pain, stiffness, decreased range of motion, back pain. Neuro-     nothing unusual Psych:  change in mood or affect.  depression or anxiety.   memory loss.   OBJ- Physical Exam General- Alert, Oriented, Affect-appropriate, Distress- none acute, not obese Skin- rash-none, lesions- none, excoriation- none Lymphadenopathy- none Head- atraumatic            Eyes- Gross vision intact, PERRLA, conjunctivae and secretions clear            Ears- Hearing, canals-normal            Nose- +mild external deviation, mucus, polyps, erosion, perforation             Throat- Mallampati III , mucosa clear , drainage- none, tonsils- atrophic, +teeth Neck- flexible , trachea midline, no stridor , thyroid  nl, carotid no bruit Chest - symmetrical excursion , unlabored           Heart/CV- RRR , no murmur , no gallop  , no rub, nl s1 s2                           - JVD- none , edema- none, stasis changes- none, varices- none           Lung- clear to P&A, wheeze- none, cough- none , dullness-none, rub- none           Chest wall-  Abd-  Br/ Gen/ Rectal- Not done, not indicated Extrem- cyanosis- none,  clubbing, none, atrophy- none, strength- nl Neuro- grossly intact to observation

## 2024-11-10 ENCOUNTER — Encounter: Payer: Self-pay | Admitting: Internal Medicine

## 2024-11-10 ENCOUNTER — Ambulatory Visit (INDEPENDENT_AMBULATORY_CARE_PROVIDER_SITE_OTHER): Admitting: Internal Medicine

## 2024-11-10 VITALS — BP 98/68 | HR 74 | Temp 97.9°F | Ht 70.0 in | Wt 154.6 lb

## 2024-11-10 DIAGNOSIS — G4733 Obstructive sleep apnea (adult) (pediatric): Secondary | ICD-10-CM | POA: Diagnosis not present

## 2024-11-10 NOTE — Patient Instructions (Addendum)
 Order- new DME, new CPAP auto 5-15, mask of choice, humidifier, supplies, AirView/ card  Please call as needed   For dry mouth- try Biotene mouthwash- swish and spit as needed                           Try XyliMelts

## 2024-11-11 ENCOUNTER — Ambulatory Visit (INDEPENDENT_AMBULATORY_CARE_PROVIDER_SITE_OTHER): Admitting: Family Medicine

## 2024-11-11 VITALS — BP 114/78 | Ht 70.0 in | Wt 149.0 lb

## 2024-11-11 DIAGNOSIS — M7741 Metatarsalgia, right foot: Secondary | ICD-10-CM | POA: Diagnosis not present

## 2024-11-11 DIAGNOSIS — M19071 Primary osteoarthritis, right ankle and foot: Secondary | ICD-10-CM | POA: Diagnosis not present

## 2024-11-11 DIAGNOSIS — M2141 Flat foot [pes planus] (acquired), right foot: Secondary | ICD-10-CM | POA: Diagnosis not present

## 2024-11-11 DIAGNOSIS — M7742 Metatarsalgia, left foot: Secondary | ICD-10-CM

## 2024-11-11 DIAGNOSIS — M19072 Primary osteoarthritis, left ankle and foot: Secondary | ICD-10-CM

## 2024-11-11 DIAGNOSIS — M2142 Flat foot [pes planus] (acquired), left foot: Secondary | ICD-10-CM

## 2024-11-11 NOTE — Progress Notes (Signed)
 PCP: Alvia Bring, DO  Patient is a 73 y.o. female here for custom orthotics.  HPI Patient has history of midfoot arthritis - having pain dorsally in this area. Also with pain dorsal aspect of her forefoot. She had orthotics about 5 years ago which did help. Since then has been purchasing OTC inserts and changing these regularly. Walks 3-5 miles a day.  Past Medical History:  Diagnosis Date   A-fib [facility])    Allergy    Arthritis    Asplenia    Asthma    as a child   Family history of breast cancer    GERD (gastroesophageal reflux disease)    Hypertension    Liver disease, unspecified    heterogenous echotexture   Pneumonia    PONV (postoperative nausea and vomiting)    Portal hypertension (HCC)    Sleep apnea    mild  no cpap   Splenic infarct     Current Outpatient Medications on File Prior to Visit  Medication Sig Dispense Refill   Acetylcysteine (NAC) 500 MG CAPS Take 500 mg by mouth 2 (two) times daily.     ALPHA LIPOIC ACID PO Take by mouth.     amoxicillin  (AMOXIL ) 500 MG capsule Take 4 capsules (2,000 mg total) by mouth 1 hour prior to dental work. 12 capsule 0   Ascorbic Acid (VITAMIN C) 500 MG CAPS Take 500 mg by mouth daily. liposoma     b complex vitamins capsule Take 1 capsule by mouth daily.     bacitracin -polymyxin b  (POLYSPORIN ) ophthalmic ointment Apply a small amount 3 times daily to surgical site. 3.5 g 3   betamethasone  dipropionate 0.05 % cream Apply liberally to affected skin 2 (two) times daily. 45 g 0   Cholecalciferol (VITAMIN D3) 125 MCG (5000 UT) CAPS Take 5,000 Units by mouth daily.     clobetasol  cream (TEMOVATE ) 0.05 % Apply 1 Application topically 2 (two) times daily. 30 g 0   co-enzyme Q-10 30 MG capsule Take 100 mg by mouth daily.     Collagen-Vitamin C-Biotin (COLLAGEN PO) 1 scoop daily- without Vitamin C     diltiazem  (CARDIZEM ) 30 MG tablet Take 1 tablet every 4 hours AS NEEDED for AFIB heart rate >100 as long as top BP >100. 30 tablet  1   ELIQUIS  5 MG TABS tablet TAKE 1 TABLET TWICE A DAY 180 tablet 3   GLYCINE PO Take 3,000 mg by mouth at bedtime.     lidocaine  4 % Place 1 patch onto the skin daily as needed (pain).     linezolid  (ZYVOX ) 600 MG tablet Take 1 tablet (600 mg total) by mouth every 12 (twelve) hours. 10 tablet 0   MAGNESIUM PO Take 280 mg by mouth at bedtime.     metoprolol  succinate (TOPROL  XL) 25 MG 24 hr tablet Take 0.5 tablets (12.5 mg total) by mouth at bedtime. 45 tablet 1   milk thistle 175 MG tablet Take 380 mg by mouth 2 (two) times daily.     Omega-3 Fatty Acids (FISH OIL PO) Take 640 mg by mouth 2 (two) times daily.     OnabotulinumtoxinA (BOTOX IJ) In her eyebrow- every 4-6 months     OVER THE COUNTER MEDICATION Take 30 mg by mouth 2 (two) times daily. Bio-Quercetin supplement     Oxymetazoline HCl (UPNEEQ) 0.1 % SOLN INSTILL 1 DROP INTO AFFECTED EYE(S) BY OPHTHALMIC ROUTE ONCE DAILY     prednisoLONE  acetate (PRED FORTE ) 1 % ophthalmic suspension  Place 1 drop into surgical eyes 4 (four) times daily. 15 mL 1   rifampin  (RIFADIN ) 300 MG capsule Take 1 capsule (300 mg total) by mouth 2 (two) times daily for 7 days. 14 capsule 0   tretinoin  (RETIN-A ) 0.05 % cream Apply topically at bedtime.     tretinoin  (RETIN-A ) 0.1 % cream Apply 1 Application topically at bedtime. 45 g 2   triamcinolone  cream (KENALOG ) 0.1 % Apply topically once a day as needed (eczema). 450 g 1   triamcinolone  cream (KENALOG ) 0.1 % Apply to affected area once daily as needed for excema. 454 g 1   Vitamin D -Vitamin K (VITAMIN K2-VITAMIN D3 PO) Take 1 tablet by mouth daily. Vitamin d  4000 units Vitamin K2 100 mg     Zinc Sulfate (ZINC 15 PO) Take 15 mg by mouth daily.     No current facility-administered medications on file prior to visit.    Past Surgical History:  Procedure Laterality Date   ABDOMINAL HYSTERECTOMY     BREAST BIOPSY Bilateral 50 yrs ago   benign   CHONDROPLASTY Left 07/28/2020   Procedure: CHONDROPLASTY LEFT  KNEE;  Surgeon: Cristy Bonner DASEN, MD;  Location: [facility];  Service: Orthopedics;  Laterality: Left;   HERNIA REPAIR     INGUINAL HERNIA REPAIR Right 05/10/2021   Procedure: RIGHT INGUINAL HERNIA REPAIR WITH MESH;  Surgeon: Vernetta Berg, MD;  Location: [facility];  Service: General;  Laterality: Right;   KNEE ARTHROSCOPY WITH LATERAL MENISECTOMY Left 07/28/2020   Procedure: LEFT KNEE ARTHROSCOPY WITH LATERAL MENISECTOMY;  Surgeon: Cristy Bonner DASEN, MD;  Location: [facility];  Service: Orthopedics;  Laterality: Left;   KNEE ARTHROSCOPY WITH MEDIAL MENISECTOMY Left 07/28/2020   Procedure: LEFT KNEE ARTHROSCOPY WITH MEDIAL MENISECTOMY;  Surgeon: Cristy Bonner DASEN, MD;  Location: [facility];  Service: Orthopedics;  Laterality: Left;   SPLENECTOMY, TOTAL     TOTAL KNEE ARTHROPLASTY Left 05/27/2023   Procedure: TOTAL KNEE ARTHROPLASTY;  Surgeon: Edna Toribio LABOR, MD;  Location: [facility];  Service: Orthopedics;  Laterality: Left;   TUBAL LIGATION  04/1981    Allergies  Allergen Reactions   Clindamycin Hives   Codeine Rash and Dermatitis   Pentazocine Hives   Sulfamethoxazole-Trimethoprim Itching and Rash     2017 Reaction: rash, itching 03/16/2021: faint rash and itching 30 min after bactrim 400/80 oral challenge.    Ciprofloxacin Dermatitis   Doxycycline Other (See Comments) and Itching    Itching without rash near end of course   Shellfish Allergy Other (See Comments)    Only scallops  shellfish derived   Levofloxacin Other (See Comments) and Rash    tendonitis  levofloxacin   Neomycin Itching   Sulfa Antibiotics Rash and Dermatitis    BP 114/78   Ht 5' 10 (1.778 m)   Wt 149 lb (67.6 kg)   BMI 21.38 kg/m       No data to display              No data to display              Objective:  Physical Exam:  Gen: NAD, comfortable in exam room  Bilateral feet/ankles: Marked pes planus left > right.  Significant TMT bossing.   Splaying 2nd-4th digits.  Calcaneal valgus worse on left. Tenderness to palpation TMT joint right > left, dorsal MT heads NV intact distally.  Assessment and Plan:  Bilateral foot pain - 2/2 metatarsalgia, TMT arthropathy, long  arch breakdown.  Custom orthotics made today as below.  Consider addition of scaphoid pads for additional long arch support in future.  Patient was fitted for a : standard, cushioned, semi-rigid orthotic. The orthotic was heated and afterward the patient stood on the orthotic blank positioned on the orthotic stand. The patient was positioned in subtalar neutral position and 10 degrees of ankle dorsiflexion in a weight bearing stance. After completion of molding, a stable base was applied to the orthotic blank. The blank was ground to a stable position for weight bearing. Size: 10 fit & run Base: none Posting: none Additional orthotic padding: Metatarsal cookies bilaterally  Total visit time 30 minutes including documentation.

## 2024-11-15 NOTE — Progress Notes (Unsigned)
  Electrophysiology Office Follow up Visit Note:    Date:  11/16/2024   ID:  Donna Ramsey, DOB 1951/04/04, MRN [id number]  PCP:  Alvia Bring, DO  [facility] Cardiologist:  None  [facility] Electrophysiologist:  OLE ONEIDA HOLTS, MD    Interval History:     Donna Ramsey is a 73 y.o. female who presents for a follow up visit.   The patient saw Daril on June 11, 2024.  She has a history of hypertension, atrial fibrillation.  She takes Eliquis , metoprolol  and as needed diltiazem . She is doing well today.  No recurrence of atrial fibrillation since February 2025.  She uses an Apple watch for monitoring daily.       Past medical, surgical, social and family history were reviewed.  ROS:   Please see the history of present illness.    All other systems reviewed and are negative.  EKGs/Labs/Other Studies Reviewed:    The following studies were reviewed today:          Physical Exam:    VS:  BP 106/71   Pulse 68   Ht 5' 10 (1.778 m)   Wt 155 lb 6.4 oz (70.5 kg)   SpO2 96%   BMI 22.30 kg/m     Wt Readings from Last 3 Encounters:  11/16/24 155 lb 6.4 oz (70.5 kg)  11/11/24 149 lb (67.6 kg)  11/10/24 154 lb 9.6 oz (70.1 kg)     GEN: no distress CARD: RRR, No MRG RESP: No IWOB. CTAB.      ASSESSMENT:    1. Paroxysmal atrial fibrillation (HCC)   2. OSA (obstructive sleep apnea)    PLAN:    In order of problems listed above:  #Paroxysmal atrial fibrillation Low burden Continue Eliquis  Continue metoprolol  Continue as needed diltiazem  Today we discussed the data supporting use of Eliquis .  She has been free of atrial fibrillation since February.  Plan to follow-up in about 6 months.  If she remains free of all atrial fibrillation in that interval, I think it would be reasonable to consider stopping the anticoagulant in place of low-dose aspirin  daily.  She wears an Apple Watch for heart rhythm monitoring nearly 24 hours a day.  #Obstructive  sleep apnea Contributing to above  I discussed my upcoming departure from [facility] during today's clinic appointment.  The patient will continue to follow-up with one of my EP partners moving forward.  Follow-up 1 year with EP APP    Signed, Ole Holts, MD, [facility], [facility] 11/16/2024 2:40 PM    Electrophysiology [facility]

## 2024-11-16 ENCOUNTER — Encounter: Payer: Self-pay | Admitting: Cardiology

## 2024-11-16 ENCOUNTER — Ambulatory Visit: Attending: Cardiology | Admitting: Cardiology

## 2024-11-16 VITALS — BP 106/71 | HR 68 | Ht 70.0 in | Wt 155.4 lb

## 2024-11-16 DIAGNOSIS — I480 Paroxysmal atrial fibrillation: Secondary | ICD-10-CM | POA: Diagnosis not present

## 2024-11-16 DIAGNOSIS — G4733 Obstructive sleep apnea (adult) (pediatric): Secondary | ICD-10-CM | POA: Diagnosis not present

## 2024-11-16 NOTE — Patient Instructions (Signed)
 Medication Instructions:  Your physician recommends that you continue on your current medications as directed. Please refer to the Current Medication list given to you today.  *If you need a refill on your cardiac medications before your next appointment, please call your pharmacy*   Follow-Up: At [facility], you and your health needs are our priority.  As part of our continuing mission to provide you with exceptional heart care, our providers are all part of one team.  This team includes your primary Cardiologist (physician) and Advanced Practice Providers or APPs (Physician Assistants and Nurse Practitioners) who all work together to provide you with the care you need, when you need it.  Your next appointment:   6 months  Provider:   Donnice Primus, MD

## 2024-11-17 ENCOUNTER — Encounter (INDEPENDENT_AMBULATORY_CARE_PROVIDER_SITE_OTHER): Admitting: Audiology

## 2024-11-17 ENCOUNTER — Encounter (INDEPENDENT_AMBULATORY_CARE_PROVIDER_SITE_OTHER): Payer: Self-pay | Admitting: Otolaryngology

## 2024-11-17 ENCOUNTER — Ambulatory Visit (INDEPENDENT_AMBULATORY_CARE_PROVIDER_SITE_OTHER): Admitting: Otolaryngology

## 2024-11-17 VITALS — BP 135/83 | HR 63 | Ht 70.0 in | Wt 150.0 lb

## 2024-11-17 DIAGNOSIS — J342 Deviated nasal septum: Secondary | ICD-10-CM | POA: Diagnosis not present

## 2024-11-17 DIAGNOSIS — J343 Hypertrophy of nasal turbinates: Secondary | ICD-10-CM

## 2024-11-17 DIAGNOSIS — R0981 Nasal congestion: Secondary | ICD-10-CM

## 2024-11-17 DIAGNOSIS — H938X2 Other specified disorders of left ear: Secondary | ICD-10-CM

## 2024-11-17 DIAGNOSIS — J3489 Other specified disorders of nose and nasal sinuses: Secondary | ICD-10-CM

## 2024-11-17 DIAGNOSIS — G4733 Obstructive sleep apnea (adult) (pediatric): Secondary | ICD-10-CM
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
## 2024-11-18 ENCOUNTER — Ambulatory Visit: Admitting: Cardiology

## 2024-11-18 ENCOUNTER — Ambulatory Visit

## 2024-11-20 ENCOUNTER — Encounter (INDEPENDENT_AMBULATORY_CARE_PROVIDER_SITE_OTHER): Payer: Self-pay

## 2024-11-21 ENCOUNTER — Encounter: Payer: Self-pay | Admitting: Internal Medicine

## 2024-11-23 ENCOUNTER — Ambulatory Visit (INDEPENDENT_AMBULATORY_CARE_PROVIDER_SITE_OTHER): Admitting: Audiology

## 2024-12-02 ENCOUNTER — Other Ambulatory Visit (HOSPITAL_COMMUNITY): Payer: Self-pay | Admitting: Physician Assistant

## 2024-12-03 ENCOUNTER — Encounter (HOSPITAL_BASED_OUTPATIENT_CLINIC_OR_DEPARTMENT_OTHER): Payer: Self-pay | Admitting: Internal Medicine

## 2024-12-03 ENCOUNTER — Other Ambulatory Visit (HOSPITAL_COMMUNITY): Payer: Self-pay

## 2024-12-03 ENCOUNTER — Telehealth: Payer: Self-pay | Admitting: Internal Medicine

## 2024-12-03 ENCOUNTER — Ambulatory Visit (HOSPITAL_BASED_OUTPATIENT_CLINIC_OR_DEPARTMENT_OTHER): Admitting: Internal Medicine

## 2024-12-03 VITALS — BP 108/70 | HR 58 | Ht 70.0 in | Wt 153.6 lb

## 2024-12-03 DIAGNOSIS — R7989 Other specified abnormal findings of blood chemistry: Secondary | ICD-10-CM | POA: Diagnosis not present

## 2024-12-03 MED FILL — Metoprolol Succinate Tab ER 24HR 25 MG (Tartrate Equiv): 12.5000 mg | ORAL | 90 days supply | Qty: 45 | Fill #0 | Status: AC

## 2024-12-03 NOTE — Progress Notes (Signed)
 LIPID CLINIC CONSULT NOTE  Chief Complaint:  Manage dyslipidemia  Primary Care Physician: Alvia Bring, DO  Primary Cardiologist:  None  HPI:  Donna Ramsey is a 73 y.o. female who is being seen today for the evaluation of dyslipidemia at the request of Alvia Bring, DO.  This is a 73 year old female kindly referred for evaluation management of dyslipidemia.  Her main concern is a high HDL cholesterol.  She had lipid testing in October which showed total cholesterol 254, triglycerides 35, HDL 145 and LDL of 103.  She had previously undergone a calcium  score in 2023 which was 0.  There is no early onset heart disease in the family.  Overall she eats a healthy diet.  She had lost 50 pounds previously.  She is a retired teacher, early years/pre.  She has been struggling with issues with her liver and had prior splenectomy.  She also has paroxysmal atrial fibrillation and is seeing Dr. Cindie.  She is not currently on any lipid-lowering therapy.  PMHx:  Past Medical History:  Diagnosis Date   A-fib [facility])    Allergy    Arthritis    Asplenia    Asthma    as a child   Family history of breast cancer    GERD (gastroesophageal reflux disease)    Hypertension    Liver disease, unspecified    heterogenous echotexture   Pneumonia    PONV (postoperative nausea and vomiting)    Portal hypertension (HCC)    Sleep apnea    mild  no cpap   Splenic infarct     Past Surgical History:  Procedure Laterality Date   ABDOMINAL HYSTERECTOMY     BREAST BIOPSY Bilateral 50 yrs ago   benign   CHONDROPLASTY Left 07/28/2020   Procedure: CHONDROPLASTY LEFT KNEE;  Surgeon: Cristy Bonner DASEN, MD;  Location: [facility];  Service: Orthopedics;  Laterality: Left;   HERNIA REPAIR     INGUINAL HERNIA REPAIR Right 05/10/2021   Procedure: RIGHT INGUINAL HERNIA REPAIR WITH MESH;  Surgeon: Vernetta Berg, MD;  Location: [facility];  Service: General;  Laterality: Right;   KNEE ARTHROSCOPY WITH LATERAL  MENISECTOMY Left 07/28/2020   Procedure: LEFT KNEE ARTHROSCOPY WITH LATERAL MENISECTOMY;  Surgeon: Cristy Bonner DASEN, MD;  Location: [facility];  Service: Orthopedics;  Laterality: Left;   KNEE ARTHROSCOPY WITH MEDIAL MENISECTOMY Left 07/28/2020   Procedure: LEFT KNEE ARTHROSCOPY WITH MEDIAL MENISECTOMY;  Surgeon: Cristy Bonner DASEN, MD;  Location: [facility];  Service: Orthopedics;  Laterality: Left;   SPLENECTOMY, TOTAL     TOTAL KNEE ARTHROPLASTY Left 05/27/2023   Procedure: TOTAL KNEE ARTHROPLASTY;  Surgeon: Edna Toribio LABOR, MD;  Location: [facility];  Service: Orthopedics;  Laterality: Left;   TUBAL LIGATION  04/1981    FAMHx:  Family History  Problem Relation Age of Onset   Breast cancer Mother 78   Stroke Mother    Cancer Mother    Hypertension Father    Heart attack Father    Arthritis Father    Heart disease Father    Diabetes Sister    Breast cancer Paternal Aunt    Breast cancer Paternal Grandmother        dx 59s   Rheum arthritis Daughter    Diabetes Sister    Hearing loss Sister     SOCHx:   reports that she has never smoked. She has never used smokeless tobacco. She reports current alcohol  use of about 7.0 standard  drinks of alcohol  per week. She reports that she does not use drugs.  ALLERGIES:  Allergies[1]  ROS: Pertinent items noted in HPI and remainder of comprehensive ROS otherwise negative.  HOME MEDS: Medications Ordered Prior to Encounter[2]  LABS/IMAGING: No results found for this or any previous visit (from the past 48 hours). No results found.  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   Ms. 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  Given no early onset heart disease in the family and 0 coronary calcium  in 2023, I suspect that her high HDL is not deleterious, rather cardioprotective.  This could be due to genetic loss of function of several genes including APOC3 or CETP.  Genetic testing could help determine this better.  She is agreeable to that today and understands it is not likely to be covered by insurance but would be a  $299 out-of-pocket charge.  This result takes about 3 weeks to get back and once I reviewed it I will reach out to her with those results.  If it is considered a low risk genetic variant and no further treatment would be warranted.  Thanks for the kind referral.  Vinie KYM Maxcy, MD, [facility], FNLA, FACP  [place]  [facility]  Medical Director of the Advanced Lipid Disorders &  Cardiovascular Risk Reduction Clinic Diplomate of the American Board of Clinical Lipidology Attending Cardiologist  Direct Dial: [phone]  Fax: [phone]  Website:  [email or web address]  Vinie JAYSON Maxcy 12/03/2024, 9:50 AM      [1]  Allergies Allergen Reactions   Clindamycin Hives   Codeine Rash and Dermatitis   Pentazocine Hives   Sulfamethoxazole-Trimethoprim Itching and Rash     2017 Reaction: rash, itching 03/16/2021: faint rash and itching 30 min after bactrim 400/80 oral challenge.    Ciprofloxacin Dermatitis   Doxycycline Other (See Comments) and Itching    Itching without rash near end of course   Shellfish Allergy Other (See Comments)    Only scallops  shellfish derived   Levofloxacin Other (See  Comments) and Rash    tendonitis  levofloxacin   Neomycin Itching   Sulfa Antibiotics Rash and Dermatitis  [2]  Current Outpatient Medications on File Prior to Visit  Medication Sig Dispense Refill   Acetylcysteine (NAC) 500 MG CAPS Take 500 mg by mouth 2 (two) times daily.     ALPHA LIPOIC ACID PO Take by mouth.     amoxicillin  (AMOXIL ) 500 MG capsule Take 4 capsules (2,000 mg total) by mouth 1 hour prior to dental work. 12 capsule 0   Ascorbic Acid (VITAMIN C) 500 MG CAPS Take 500  mg by mouth daily. liposoma     ASTAXANTHIN PO Take 12 mg by mouth daily.     b complex vitamins capsule Take 1 capsule by mouth daily.     Cholecalciferol (VITAMIN D3) 125 MCG (5000 UT) CAPS Take 5,000 Units by mouth daily.     clobetasol  cream (TEMOVATE ) 0.05 % Apply 1 Application topically 2 (two) times daily. 30 g 0   co-enzyme Q-10 30 MG capsule Take 100 mg by mouth daily.     diltiazem  (CARDIZEM ) 30 MG tablet Take 1 tablet every 4 hours AS NEEDED for AFIB heart rate >100 as long as top BP >100. 30 tablet 1   ELIQUIS  5 MG TABS tablet TAKE 1 TABLET TWICE A DAY 180 tablet 3   GLYCINE PO Take 3,000 mg by mouth at bedtime.     lidocaine  4 % Place 1 patch onto the skin daily as needed (pain).     MAGNESIUM PO Take 280 mg by mouth at bedtime.     metoprolol  succinate (TOPROL  XL) 25 MG 24 hr tablet Take 0.5 tablets (12.5 mg total) by mouth at bedtime. 45 tablet 1   milk thistle 175 MG tablet Take 380 mg by mouth 2 (two) times daily.     Omega-3 Fatty Acids (FISH OIL PO) Take 640 mg by mouth 2 (two) times daily.     OnabotulinumtoxinA (BOTOX IJ) In her eyebrow- every 4-6 months     Probiotic Product (PROBIOTIC BLEND PO) Take by mouth in the morning and at bedtime.     rifampin  (RIFADIN ) 300 MG capsule Take 1 capsule (300 mg total) by mouth 2 (two) times daily for 7 days. 14 capsule 0   triamcinolone  cream (KENALOG ) 0.1 % Apply topically once a day as needed (eczema). 450 g 1   Vitamin D -Vitamin K (VITAMIN  K2-VITAMIN D3 PO) Take 1 tablet by mouth daily. Vitamin d  4000 units Vitamin K2 100 mg     Zinc Sulfate (ZINC 15 PO) Take 15 mg by mouth daily.     bacitracin -polymyxin b  (POLYSPORIN ) ophthalmic ointment Apply a small amount 3 times daily to surgical site. (Patient not taking: Reported on 12/03/2024) 3.5 g 3   betamethasone  dipropionate 0.05 % cream Apply liberally to affected skin 2 (two) times daily. (Patient not taking: Reported on 12/03/2024) 45 g 0   Collagen-Vitamin C-Biotin (COLLAGEN PO) 1 scoop daily- without Vitamin C (Patient not taking: Reported on 12/03/2024)     linezolid  (ZYVOX ) 600 MG tablet Take 1 tablet (600 mg total) by mouth every 12 (twelve) hours. (Patient not taking: Reported on 12/03/2024) 10 tablet 0   OVER THE COUNTER MEDICATION Take 30 mg by mouth 2 (two) times daily. Bio-Quercetin supplement (Patient not taking: Reported on 12/03/2024)     Oxymetazoline HCl (UPNEEQ) 0.1 % SOLN INSTILL 1 DROP INTO AFFECTED EYE(S) BY OPHTHALMIC ROUTE ONCE DAILY (Patient not taking: Reported on 12/03/2024)     tretinoin  (RETIN-A ) 0.1 % cream Apply 1 Application topically at bedtime. (Patient not taking: Reported on 12/03/2024) 45 g 2   No current facility-administered medications on file prior to visit.

## 2024-12-03 NOTE — Patient Instructions (Signed)
 Medication Instructions:  NO CHANGES  *If you need a refill on your cardiac medications before your next appointment, please call your pharmacy*   Testing/Procedures: Genetic Test -- results available in about 2-3 weeks  Follow-Up: At [facility], you and your health needs are our priority.  As part of our continuing mission to provide you with exceptional heart care, our providers are all part of one team.  This team includes your primary Cardiologist (physician) and Advanced Practice Providers or APPs (Physician Assistants and Nurse Practitioners) who all work together to provide you with the care you need, when you need it.  Your next appointment:   AS NEEDED with Dr. Mona   We recommend signing up for the patient portal called MyChart.  Sign up information is provided on this After Visit Summary.  MyChart is used to connect with patients for Virtual Visits (Telemedicine).  Patients are able to view lab/test results, encounter notes, upcoming appointments, etc.  Non-urgent messages can be sent to your provider as well.   To learn more about what you can do with MyChart, go to [email or web address].   Other Instructions

## 2024-12-03 NOTE — Telephone Encounter (Signed)
 Genetic test for dyslipidemia/ASCVD ordered ([facility]) Cheek swab completed in office Specimen and necessary paperwork mailed. ID: [id number]

## 2024-12-24 ENCOUNTER — Other Ambulatory Visit (HOSPITAL_COMMUNITY): Payer: Self-pay

## 2024-12-24 MED ORDER — METHYLPREDNISOLONE 4 MG PO TBPK
ORAL_TABLET | ORAL | 0 refills | Status: AC
  Filled 2024-12-24: qty 21, 6d supply, fill #0

## 2024-12-25 ENCOUNTER — Encounter: Payer: Self-pay | Admitting: Internal Medicine

## 2024-12-30 ENCOUNTER — Encounter (HOSPITAL_BASED_OUTPATIENT_CLINIC_OR_DEPARTMENT_OTHER): Payer: Self-pay | Admitting: Internal Medicine

## 2025-01-01 ENCOUNTER — Ambulatory Visit (INDEPENDENT_AMBULATORY_CARE_PROVIDER_SITE_OTHER): Admitting: Audiology

## 2025-01-05 ENCOUNTER — Other Ambulatory Visit (HOSPITAL_COMMUNITY): Payer: Self-pay

## 2025-01-05 MED ORDER — AMOXICILLIN 500 MG PO CAPS
1000.0000 mg | ORAL_CAPSULE | Freq: Two times a day (BID) | ORAL | 99 refills | Status: AC
  Filled 2025-01-05 – 2025-01-11 (×2): qty 4, 1d supply, fill #0

## 2025-01-05 MED ORDER — AMOXICILLIN 500 MG PO CAPS
500.0000 mg | ORAL_CAPSULE | Freq: Three times a day (TID) | ORAL | 0 refills | Status: AC
  Filled 2025-01-05: qty 30, 10d supply, fill #0

## 2025-01-11 ENCOUNTER — Other Ambulatory Visit (HOSPITAL_COMMUNITY): Payer: Self-pay

## 2025-01-11 ENCOUNTER — Other Ambulatory Visit: Payer: Self-pay

## 2025-01-11 ENCOUNTER — Ambulatory Visit: Admitting: Pulmonary Disease

## 2025-01-21 ENCOUNTER — Other Ambulatory Visit (HOSPITAL_COMMUNITY): Payer: Self-pay

## 2025-01-21 MED ORDER — HYDROXYZINE HCL 10 MG/5ML PO SYRP
ORAL_SOLUTION | ORAL | 0 refills | Status: DC
  Filled 2025-01-21: qty 25, 1d supply, fill #0

## 2025-01-21 MED ORDER — AMOXICILLIN 500 MG PO CAPS
ORAL_CAPSULE | ORAL | 0 refills | Status: AC
  Filled 2025-01-21: qty 25, 7d supply, fill #0

## 2025-01-28 ENCOUNTER — Ambulatory Visit: Admitting: Pulmonary Disease

## 2025-02-09 ENCOUNTER — Ambulatory Visit: Admitting: Pulmonary Disease

## 2025-02-10 ENCOUNTER — Ambulatory Visit: Admitting: Pulmonary Disease
